# Patient Record
Sex: Male | Born: 1950 | Race: White | Hispanic: No | Smoking: Former smoker
Health system: Southern US, Community
[De-identification: ages and names within clinical notes are randomized; demographics above are authoritative.]

## PROBLEM LIST (undated history)

## (undated) DIAGNOSIS — H9319 Tinnitus, unspecified ear: Secondary | ICD-10-CM

## (undated) DIAGNOSIS — E7800 Pure hypercholesterolemia, unspecified: Secondary | ICD-10-CM

## (undated) DIAGNOSIS — F319 Bipolar disorder, unspecified: Secondary | ICD-10-CM

## (undated) DIAGNOSIS — F32A Depression, unspecified: Secondary | ICD-10-CM

## (undated) DIAGNOSIS — I219 Acute myocardial infarction, unspecified: Secondary | ICD-10-CM

## (undated) DIAGNOSIS — I2510 Atherosclerotic heart disease of native coronary artery without angina pectoris: Secondary | ICD-10-CM

## (undated) DIAGNOSIS — F329 Major depressive disorder, single episode, unspecified: Secondary | ICD-10-CM

## (undated) DIAGNOSIS — K219 Gastro-esophageal reflux disease without esophagitis: Secondary | ICD-10-CM

## (undated) DIAGNOSIS — F419 Anxiety disorder, unspecified: Secondary | ICD-10-CM

## (undated) DIAGNOSIS — G43909 Migraine, unspecified, not intractable, without status migrainosus: Secondary | ICD-10-CM

## (undated) DIAGNOSIS — M1990 Unspecified osteoarthritis, unspecified site: Secondary | ICD-10-CM

## (undated) DIAGNOSIS — D509 Iron deficiency anemia, unspecified: Secondary | ICD-10-CM

## (undated) HISTORY — PX: HAND SURGERY: SHX662

## (undated) HISTORY — PX: ROTATOR CUFF REPAIR: SHX139

## (undated) HISTORY — PX: HERNIA REPAIR: SHX51

## (undated) HISTORY — PX: BACK SURGERY: SHX140

## (undated) HISTORY — DX: Pure hypercholesterolemia, unspecified: E78.00

## (undated) HISTORY — DX: Iron deficiency anemia, unspecified: D50.9

---

## 2001-02-23 ENCOUNTER — Encounter: Admission: RE | Admit: 2001-02-23 | Discharge: 2001-02-23 | Payer: Self-pay | Admitting: Otolaryngology

## 2001-02-23 ENCOUNTER — Encounter: Payer: Self-pay | Admitting: Otolaryngology

## 2001-02-23 ENCOUNTER — Ambulatory Visit (HOSPITAL_COMMUNITY): Admission: RE | Admit: 2001-02-23 | Discharge: 2001-02-23 | Payer: Self-pay | Admitting: Otolaryngology

## 2002-07-20 ENCOUNTER — Encounter: Payer: Self-pay | Admitting: Family Medicine

## 2002-07-20 ENCOUNTER — Ambulatory Visit (HOSPITAL_COMMUNITY): Admission: RE | Admit: 2002-07-20 | Discharge: 2002-07-20 | Payer: Self-pay | Admitting: Family Medicine

## 2002-07-23 ENCOUNTER — Ambulatory Visit (HOSPITAL_COMMUNITY): Admission: RE | Admit: 2002-07-23 | Discharge: 2002-07-23 | Payer: Self-pay | Admitting: Family Medicine

## 2002-07-23 ENCOUNTER — Encounter: Payer: Self-pay | Admitting: Family Medicine

## 2002-08-29 ENCOUNTER — Ambulatory Visit (HOSPITAL_BASED_OUTPATIENT_CLINIC_OR_DEPARTMENT_OTHER): Admission: RE | Admit: 2002-08-29 | Discharge: 2002-08-30 | Payer: Self-pay | Admitting: Orthopedic Surgery

## 2004-02-03 ENCOUNTER — Ambulatory Visit (HOSPITAL_COMMUNITY): Admission: RE | Admit: 2004-02-03 | Discharge: 2004-02-03 | Payer: Self-pay | Admitting: Family Medicine

## 2004-05-28 ENCOUNTER — Emergency Department (HOSPITAL_COMMUNITY): Admission: EM | Admit: 2004-05-28 | Discharge: 2004-05-28 | Payer: Self-pay | Admitting: *Deleted

## 2004-08-17 ENCOUNTER — Emergency Department (HOSPITAL_COMMUNITY): Admission: EM | Admit: 2004-08-17 | Discharge: 2004-08-17 | Payer: Self-pay | Admitting: Emergency Medicine

## 2004-08-17 ENCOUNTER — Ambulatory Visit: Payer: Self-pay | Admitting: Psychiatry

## 2004-08-17 ENCOUNTER — Inpatient Hospital Stay (HOSPITAL_COMMUNITY): Admission: RE | Admit: 2004-08-17 | Discharge: 2004-08-21 | Payer: Self-pay | Admitting: Psychiatry

## 2006-06-29 ENCOUNTER — Ambulatory Visit (HOSPITAL_COMMUNITY): Admission: RE | Admit: 2006-06-29 | Discharge: 2006-06-29 | Payer: Self-pay | Admitting: General Surgery

## 2006-06-29 ENCOUNTER — Encounter (INDEPENDENT_AMBULATORY_CARE_PROVIDER_SITE_OTHER): Payer: Self-pay | Admitting: General Surgery

## 2007-09-28 ENCOUNTER — Ambulatory Visit (HOSPITAL_BASED_OUTPATIENT_CLINIC_OR_DEPARTMENT_OTHER): Admission: RE | Admit: 2007-09-28 | Discharge: 2007-09-28 | Payer: Self-pay | Admitting: Orthopedic Surgery

## 2008-12-13 ENCOUNTER — Ambulatory Visit (HOSPITAL_COMMUNITY): Admission: RE | Admit: 2008-12-13 | Discharge: 2008-12-13 | Payer: Self-pay | Admitting: Family Medicine

## 2009-12-25 ENCOUNTER — Ambulatory Visit (HOSPITAL_COMMUNITY)
Admission: RE | Admit: 2009-12-25 | Discharge: 2009-12-25 | Payer: Self-pay | Source: Home / Self Care | Admitting: Internal Medicine

## 2009-12-25 ENCOUNTER — Ambulatory Visit: Payer: Self-pay | Admitting: Internal Medicine

## 2010-06-09 NOTE — Op Note (Signed)
Corey [name], Corey [name]               ACCOUNT NO.:  [id number]   MEDICAL RECORD NO.:  [id number]          PATIENT TYPE:  AMB   LOCATION:  DAY                           FACILITY:  [facility]   PHYSICIAN:  Dalia Heading, M.D.  DATE OF BIRTH:  October 02, 1950   DATE OF PROCEDURE:  06/29/2006  DATE OF DISCHARGE:                               OPERATIVE REPORT   PREOPERATIVE DIAGNOSIS:  Neoplasm, left thigh.   POSTOPERATIVE DIAGNOSIS:  Neoplasm, left thigh, probable lipoma.   PROCEDURE:  Excision of neoplasm, left thigh.   SURGEON:  Dalia Heading, M.D.   ANESTHESIA:  General.   INDICATIONS:  The patient is a 60 year old white male who presents with  an enlarging subcutaneous mass below the left gluteal fold posteriorly.  The risks and benefits of the procedure including bleeding, infection,  and recurrence of the mass were fully explained to the patient who gave  informed consent.   PROCEDURE NOTE:  The patient was placed in the right lateral decubitus  position after general anesthesia was administered.  The left posterior  upper thigh was prepped and draped in the usual sterile technique with  Betadine.  Surgical site confirmation was performed.   A transverse incision was made over the mass which ultimately measured  approximately 6 cm in its greatest diameter.  It appeared to be a  lipoma.  It was fully excised down to the muscle layer without  difficulty.  It was sent to pathology for further examination.  Any  bleeding was controlled using Bovie electrocautery.  The subcutaneous  layer was reapproximated using a 3-0 Vicryl interrupted suture.  The  skin was closed using a 4-0 Vicryl subcuticular suture.  0.5%  Sensorcaine was instilled in the surrounding wound.  Dermabond was then  applied.   All tape and needle counts were correct at the end of the procedure.  The patient was awakened and transferred to the PACU in stable  condition.   COMPLICATIONS:  None.   SPECIMEN:  Soft  tissue neoplasm, left thigh.   ESTIMATED BLOOD LOSS:  Minimal.      Dalia Heading, M.D.  Electronically Signed     MAJ/MEDQ  D:  06/29/2006  T:  06/29/2006  Job:  [id number]   cc:   Patrica Duel, M.D.  Fax: [phone]

## 2010-06-09 NOTE — Op Note (Signed)
NAMEKEVANTE, LUNT               ACCOUNT NO.:  [id number]   MEDICAL RECORD NO.:  [id number]          PATIENT TYPE:  AMB   LOCATION:  DSC                          FACILITY:  [facility]   PHYSICIAN:  Loreta Ave, M.D. DATE OF BIRTH:  1950/06/10   DATE OF PROCEDURE:  09/28/2007  DATE OF DISCHARGE:                               OPERATIVE REPORT   PREOPERATIVE DIAGNOSIS:  Right shoulder massive rotator cuff avulsion,  previous repair and decompression more than 10 years ago.   POSTOPERATIVE DIAGNOSES:  1. Right shoulder massive rotator cuff avulsion, previous repair and      decompression more than 10 years ago.  2. Marked intertendinous tearing, retraction, but still able to be      repaired.  3. Previous rupture of long head biceps tendon.  4. Previous bony decompression.  5. No evidence of recurrent bony impingement.  6. Complex tearing, superior and posterior labrum.   PROCEDURE:  1. Right shoulder exam under anesthesia.  2. Arthroscopy.  3. Debridement of labrum.  4. Extensive cuff debridement with lysis debridement of adhesions to      free up and advance rotator cuff to allow for repair.  5. Arthroscopic-assisted repair with fiber weave suture x3 and Swivel-      Lock anchors x3.   SURGEON:  Loreta Ave, MD   ASSISTANT:  Genene Churn. Barry Dienes, [place], present at the entire case, necessary  for timely completion of procedure.   ANESTHESIA:  General.   BLOOD LOSS:  Minimal.   SPECIMENS:  None.   CULTURES:  None.   COMPLICATIONS:  None.   DRESSING:  Sterile compressive with shoulder immobilizer and abduction  pillow.   PROCEDURE:  The patient was brought to the operating room and after  adequate anesthesia had been obtained, placed in beach-chair position on  a shoulder positioner, and prepped and draped in the usual sterile  fashion.  Shoulder examined.  Full motion, good stability.  Three  portals created; anterior, posterior, and lateral.  Shoulder entered  with  blunt obturator.  Arthroscope was introduced and inspected.  Extensive intratendinous tearing, degeneration of the cuff.  Completely  avulsed off the humeral attachment.  All the abnormal degenerative  tissue debrided.  This left the cuff about half way or more to the  glenoid.  After thoroughly assessing the tissue despite degree of  retraction, repair was a feasible option.  I freed up the cuff above and  below going extremely medial in order to immobilize this to allow for  repair.  The superior labrum, which was torn was debrided as was the  posterosuperior labrum.  Biceps tendon was absent from previous  intervention.  Looked above and no recurrent bony impingement at the  acromion or distal clavicle.  When that was all complete and the cuff  immobilized, it was captured with 3 horizontal fiber weave mattress  sutures.  These were all pulled out laterally to advance the cuff to be  sure it got in good position without too much tension.  Using Swivel-  Lock device, all 3 sutures were then firmly anchored  in the tuberosity.  At completion, I had a nice firm watertight closure of the cuff,  advanced over to the tuberosity, and I could bring through full passive  motion without too much tension on the repair.  This was inspected on  full angles and again I confirmed no further bony decompression was  necessary.  The lateral portal, which had been opened for a large  cannula then had the cannula removed.  Wounds were all irrigated and  closed with nylon.  Sterile compressive dressing was applied.  Shoulder  immobilizer with abduction pillow applied.  Anesthesia reversed.  Brought to the recovery room.  Tolerated the surgery well.  No  complications.      Loreta Ave, M.D.  Electronically Signed     DFM/MEDQ  D:  09/28/2007  T:  09/29/2007  Job:  [id number]

## 2010-06-09 NOTE — H&P (Signed)
NAMESAILOR, HAUGHN               ACCOUNT NO.:  [id number]   MEDICAL RECORD NO.:  [id number]          PATIENT TYPE:  AMB   LOCATION:  DAY                           FACILITY:  [facility]   PHYSICIAN:  Dalia Heading, M.D.  DATE OF BIRTH:  08-09-1950   DATE OF ADMISSION:  06/29/2006  DATE OF DISCHARGE:  LH                              HISTORY & PHYSICAL   CHIEF COMPLAINT:  Neoplasm, left thigh.   HISTORY OF PRESENT ILLNESS:  The patient is a 60 year old white male who  was referred for evaluation and treatment of a mass on his left thigh.  It is posterior and inferior to the gluteal fold.  It is tender to  touch.  It is causing him discomfort when sitting.  It has been  worsening over the past month.   PAST MEDICAL HISTORY:  Bipolar disorder.   PAST SURGICAL HISTORY:  1. Lower back surgery.  2. Rotator cuff repair.  3. Herniorrhaphy.   CURRENT MEDICATIONS:  1. Depakote.  2. Lamictal.  3. Xanax.   ALLERGIES:  No known drug allergies.   SOCIAL HISTORY:  The patient denies drinking or smoking.   REVIEW OF SYSTEMS:  Denies any cardiopulmonary difficulties or bleeding  disorders.   PHYSICAL EXAMINATION:  GENERAL:  The patient is a well-developed and  well-nourished white male, in no acute distress.  LUNGS:  Clear to auscultation with equal breath sounds bilaterally.  HEART:  Exam reveals an regular rate and rhythm without S3, S4 or  murmurs.  EXTREMITIES:  Reveals a 5 cm soft ovoid subcutaneous mass below the left  gluteal fold posteriorly on the left thigh.   IMPRESSION:  Neoplasm, left thigh, subcutaneous.   PLAN:  The patient is scheduled for an excision of the mass, left thigh  on June 29, 2006.  The risks and benefits of the procedure, including  bleeding, infection and pain were fully explained to the patient, who  gave an informed consent.      Dalia Heading, M.D.  Electronically Signed     MAJ/MEDQ  D:  06/28/2006  T:  06/28/2006  Job:  [id number]   cc:   Patrica Duel, M.D.  Fax: [phone]

## 2010-06-12 NOTE — Discharge Summary (Signed)
Corey [name], Corey [name]               ACCOUNT NO.:  [id number]   MEDICAL RECORD NO.:  [id number]          PATIENT TYPE:  IPS   LOCATION:  0303                          FACILITY:  [facility]   PHYSICIAN:  Anselm Jungling, MD  DATE OF BIRTH:  21-Dec-1950   DATE OF ADMISSION:  08/17/2004  DATE OF DISCHARGE:  08/21/2004                                 DISCHARGE SUMMARY   IDENTIFYING DATA AND REASON FOR ADMISSION:  This is the second [facility] admission  for Corey [name], a 60 year old married white male with a history of bipolar  affective disorder.  He was admitted due to increasing depression and  irritability, and mood lability.  He is a patient of Dr. Tomasa Rand.  In  addition to the above, he presented with anhedonia and decreased  concentration for 3 weeks, which made it impossible for him to attend work  as usual.  He had suicidal ideation in the emergency department prior to  admission, but once he was admitted to the inpatient service.   The patient had been on a regimen of Depakote, Xanax, and Lamictal.   INITIAL DIAGNOSTIC IMPRESSION:  AXIS I:  Bipolar affective disorder,  depressed versus mixed.  AXIS II:  Deferred.  AXIS III:  No acute or chronic illnesses.  AXIS IV:  Stressors, severe.  AXIS V:  Global assessment of function on admission 40.   MEDICAL AND LABORATORY:  Admission laboratory included a CBC and chemistry  panel which were within normal limits.  A hemoglobin A1c was normal at 5.8.  A urinalysis was within normal limits.   HOSPITAL COURSE:  The patient was admitted to the adult inpatient service  where he participated in various therapeutic groups, activities and classes  designed to help him acquire better coping skills, a better understanding of  his underlying disorders and dynamics, and the development of an aftercare  plan.  He processed issues around an insensitive boss at work, that was  contributing to his depression and mood lability.   He slept poorly throughout his  inpatient stay.  Initially, trazodone 50 mg  at h.s. was tried, and later increased to 100 mg.  Finally, with a  combination of trazodone 100 mg and Ambien 10 mg he slept reasonably well.   He was continued on Depakote ER at a dose of 2000 mg q.h.s.  He was also  continued on Lamictal 100 mg q.h.s., Xanax 1 mg b.i.d., Effexor XR 150 mg  p.o. daily, and Wellbutrin XL 150 mg p.o. daily.   Over the remainder of his the patient stay, he became gradually less  depressed, with brighter affect, greater hopefulness and future orientation.   AFTERCARE PLANS:  The patient was discharged with a plan for him to follow  up with Dr. Tomasa Rand on July 31 at 9:45 a.m.  Also, he was to see his  therapist, Mr. Letta Kocher, at [facility], on September 01, 2004 at 3:15  p.m.   DISCHARGE MEDICATIONS:  Depakote ER 2000 mg at bedtime, Xanax 1 mg b.i.d.,  Lamictal 100 mg daily, Effexor XR 150 mg daily, and Wellbutrin XL 150 mg  p.o. daily.   DISCHARGE DIAGNOSES:  AXIS I:  Bipolar affective disorder, most recently  depressed, without psychotic features.  AXIS II:  Deferred.  AXIS III:  No acute or chronic illnesses.  AXIS IV:  Stressors, severe.  AXIS V:  Global assessment of function on discharge 75.           ______________________________  Anselm Jungling, MD  Electronically Signed     SPB/MEDQ  D:  09/09/2004  T:  09/09/2004  Job:  [id number]

## 2010-06-12 NOTE — Op Note (Signed)
NAME:  Corey [name], Corey [name]                         ACCOUNT NO.:  [id number]   MEDICAL RECORD NO.:  [id number]                   PATIENT TYPE:  AMB   LOCATION:  DSC                                  FACILITY:  [facility]   PHYSICIAN:  Loreta Ave, M.D.              DATE OF BIRTH:  [birth year]   DATE OF PROCEDURE:  08/29/2002  DATE OF DISCHARGE:                                 OPERATIVE REPORT   PREOPERATIVE DIAGNOSIS:  Traumatic rotator cuff tear, left shoulder, with  subacromial impingement and distal clavicle osteolysis.   POSTOPERATIVE DIAGNOSIS:  Traumatic rotator cuff tear, left shoulder, with  subacromial impingement and distal clavicle osteolysis, with partial  tearing, long head biceps tendon, as well as antrum labrum tear.   PROCEDURES:  1. Left shoulder examination under anesthesia, arthroscopy, debridement of     biceps tendon and labral partial tears.  2. Debridement of rotator cuff.  3. Acromioplasty.  4. Release, coracoacromial ligament.  5. Excision, distal clavicle.  6. Open repair, rotator cuff tear, with Fibrewire suture and Concept repair     system.   SURGEON:  Loreta Ave, M.D.   ASSISTANT:  Arlys John D. Petrarca, P.A.-C.   ANESTHESIA:  General.   ESTIMATED BLOOD LOSS:  Minimal.   SPECIMENS:  None.   CULTURES:  None.   COMPLICATIONS:  None.   DRESSING:  Soft compressive with shoulder immobilizer.   DESCRIPTION OF PROCEDURE:  Patient brought to the operating room and placed  on the operating table in supine position.  After adequate anesthesia had  been obtained, the left shoulder examined.  Full motion and good stability.  Placed in a beach chair position on the shoulder positioner and prepped and  draped in the usual sterile fashion.  Three standard arthroscopic portals,  anterior, posterior, lateral.  Shoulder entered with a blunt obturator,  distended, and inspected.  Full-thickness tearing, supraspinatus tendon,  almost entire crescent.   Tearing, long head biceps tendon.  Both of these  were debrided.  Although question of subluxation, long head biceps tendon by  MRI, this was anteriorly placed but I will felt it was within the bicipital  groove.  The biceps anchor intact.  Anterior labral tear in the midportion  debrided.  No instability.  Capsular and ligamentous structures intact.  After debridement of the inside of the shoulder, cannula redirected  subacromially.  Typical impingement.  Confirmation of full-thickness tear of  cuff.  Acromioplasty from a type 3 to a type 1 acromion with shaver and high-  speed bur.  CA ligament released with cautery.  Distal clavicle, grade 3 and  4 changes.  Lateral centimeter resected.  Adequacy of decompression and  clavicle excision confirmed viewing from all portals.  Instruments and fluid  removed.  A deltoid-splitting incision through the lateral portal.  Subacromial space accessed.  The cuff was mobilized, debrided back to  healthy tissue.  Adequacy of decompression  confirmed visually.  The cuff was  well-captured with Fibrewire suture and a bony trough created in the humerus  at the site of attachment.  I still felt the biceps tendon was anchored  within the bicipital groove, and it was left in place.  The biceps did not  need further tenodesis.  The rotator cuff was then repaired at the humerus  utilizing Fibrewire suture and the Concept repair system, passing the  sutures through drill holes.  Firmly tied over a bony bridge.  A nice, firm  watertight closure of the cuff.  Adequacy of repair confirmed.  I could go  through full passive motion without undue tension after repair.  Wound  copiously irrigated.  Deltoid closed with Vicryl.  Skin and subcutaneous  tissue with Vicryl.  The portals closed with nylon.  Margins of the wound  injected with Marcaine.  Sterile compressive dressing applied.  Sling  applied.  Anesthesia reversed.  Brought to the recovery room.  Tolerated the   surgery well with no complications.                                                Loreta Ave, M.D.    DFM/MEDQ  D:  08/29/2002  T:  08/30/2002  Job:  [id number]

## 2010-10-28 LAB — POCT HEMOGLOBIN-HEMACUE: Hemoglobin: 15.1

## 2010-11-12 LAB — BASIC METABOLIC PANEL
BUN: 12
Calcium: 9
Creatinine, Ser: 1.1
GFR calc non Af Amer: >60
Potassium: 4

## 2010-11-12 LAB — CBC
Platelets: 215
WBC: 7.8

## 2011-02-01 DIAGNOSIS — G43719 Chronic migraine without aura, intractable, without status migrainosus: Secondary | ICD-10-CM | POA: Diagnosis not present

## 2011-02-01 DIAGNOSIS — G4700 Insomnia, unspecified: Secondary | ICD-10-CM | POA: Diagnosis not present

## 2011-04-01 DIAGNOSIS — G4700 Insomnia, unspecified: Secondary | ICD-10-CM | POA: Diagnosis not present

## 2011-04-01 DIAGNOSIS — G43719 Chronic migraine without aura, intractable, without status migrainosus: Secondary | ICD-10-CM | POA: Diagnosis not present

## 2011-04-02 DIAGNOSIS — R51 Headache: Secondary | ICD-10-CM | POA: Diagnosis not present

## 2011-04-12 DIAGNOSIS — F331 Major depressive disorder, recurrent, moderate: Secondary | ICD-10-CM | POA: Diagnosis not present

## 2011-04-30 DIAGNOSIS — G43009 Migraine without aura, not intractable, without status migrainosus: Secondary | ICD-10-CM | POA: Diagnosis not present

## 2011-04-30 DIAGNOSIS — M47812 Spondylosis without myelopathy or radiculopathy, cervical region: Secondary | ICD-10-CM | POA: Diagnosis not present

## 2011-08-16 DIAGNOSIS — F331 Major depressive disorder, recurrent, moderate: Secondary | ICD-10-CM | POA: Diagnosis not present

## 2011-09-07 DIAGNOSIS — G43009 Migraine without aura, not intractable, without status migrainosus: Secondary | ICD-10-CM | POA: Diagnosis not present

## 2011-10-04 ENCOUNTER — Emergency Department (HOSPITAL_COMMUNITY)
Admission: EM | Admit: 2011-10-04 | Discharge: 2011-10-04 | Disposition: A | Discharge status: Home / Self Care | Payer: No Typology Code available for payment source | Attending: Emergency Medicine | Admitting: Emergency Medicine

## 2011-10-04 ENCOUNTER — Emergency Department (HOSPITAL_COMMUNITY): Payer: No Typology Code available for payment source

## 2011-10-04 ENCOUNTER — Encounter (HOSPITAL_COMMUNITY): Payer: Self-pay | Admitting: *Deleted

## 2011-10-04 DIAGNOSIS — S39012A Strain of muscle, fascia and tendon of lower back, initial encounter: Secondary | ICD-10-CM

## 2011-10-04 DIAGNOSIS — Y93I9 Activity, other involving external motion: Secondary | ICD-10-CM | POA: Insufficient documentation

## 2011-10-04 DIAGNOSIS — Y998 Other external cause status: Secondary | ICD-10-CM | POA: Insufficient documentation

## 2011-10-04 DIAGNOSIS — S335XXA Sprain of ligaments of lumbar spine, initial encounter: Secondary | ICD-10-CM | POA: Insufficient documentation

## 2011-10-04 DIAGNOSIS — S161XXA Strain of muscle, fascia and tendon at neck level, initial encounter: Secondary | ICD-10-CM

## 2011-10-04 DIAGNOSIS — S139XXA Sprain of joints and ligaments of unspecified parts of neck, initial encounter: Secondary | ICD-10-CM | POA: Insufficient documentation

## 2011-10-04 MED ORDER — HYDROCODONE-ACETAMINOPHEN 5-325 MG PO TABS
2.0000 | ORAL_TABLET | Freq: Once | ORAL | Status: DC
  Filled 2011-10-04: qty 2

## 2011-10-04 MED ORDER — ONDANSETRON HCL 4 MG PO TABS
4.0000 mg | ORAL_TABLET | Freq: Once | ORAL | Status: DC
  Filled 2011-10-04: qty 1

## 2011-10-04 MED ORDER — HYDROCODONE-ACETAMINOPHEN 5-325 MG PO TABS: ORAL_TABLET | ORAL | Status: DC

## 2011-10-04 MED ORDER — METHOCARBAMOL 500 MG PO TABS
500.0000 mg | ORAL_TABLET | Freq: Once | ORAL | Status: DC
  Filled 2011-10-04: qty 1

## 2011-10-04 MED ORDER — CYCLOBENZAPRINE HCL 10 MG PO TABS: 10.0000 mg | ORAL_TABLET | Freq: Two times a day (BID) | ORAL | Status: AC | PRN
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
## 2011-10-04 NOTE — ED Notes (Signed)
c-collar placed in triage

## 2011-10-04 NOTE — ED Notes (Signed)
No answer when called 

## 2011-10-04 NOTE — ED Notes (Signed)
Hobson PA in room with pt and family at present time,

## 2011-10-05 NOTE — ED Provider Notes (Signed)
Medical screening examination/treatment/procedure(s) were performed by non-physician practitioner and as supervising physician I was immediately available for consultation/collaboration.   Zygmund Passero W Mats Jeanlouis, MD 10/05/11 1703 

## 2011-10-06 ENCOUNTER — Other Ambulatory Visit (HOSPITAL_COMMUNITY): Payer: Self-pay | Admitting: Nephrology

## 2011-10-06 DIAGNOSIS — N289 Disorder of kidney and ureter, unspecified: Secondary | ICD-10-CM

## 2011-10-06 DIAGNOSIS — I10 Essential (primary) hypertension: Secondary | ICD-10-CM | POA: Diagnosis not present

## 2011-10-06 DIAGNOSIS — D649 Anemia, unspecified: Secondary | ICD-10-CM | POA: Diagnosis not present

## 2011-10-06 DIAGNOSIS — R809 Proteinuria, unspecified: Secondary | ICD-10-CM | POA: Diagnosis not present

## 2011-10-07 DIAGNOSIS — F331 Major depressive disorder, recurrent, moderate: Secondary | ICD-10-CM | POA: Diagnosis not present

## 2011-10-21 ENCOUNTER — Ambulatory Visit (HOSPITAL_COMMUNITY)
Admission: RE | Admit: 2011-10-21 | Discharge: 2011-10-21 | Disposition: A | Discharge status: Home / Self Care | Payer: BC Managed Care – PPO | Source: Ambulatory Visit | Attending: Nephrology | Admitting: Nephrology

## 2011-10-21 DIAGNOSIS — I10 Essential (primary) hypertension: Secondary | ICD-10-CM | POA: Diagnosis not present

## 2011-10-21 DIAGNOSIS — Z79899 Other long term (current) drug therapy: Secondary | ICD-10-CM | POA: Diagnosis not present

## 2011-10-21 DIAGNOSIS — Z1159 Encounter for screening for other viral diseases: Secondary | ICD-10-CM | POA: Diagnosis not present

## 2011-10-21 DIAGNOSIS — D649 Anemia, unspecified: Secondary | ICD-10-CM | POA: Diagnosis not present

## 2011-10-21 DIAGNOSIS — N289 Disorder of kidney and ureter, unspecified: Secondary | ICD-10-CM

## 2011-10-21 DIAGNOSIS — R809 Proteinuria, unspecified: Secondary | ICD-10-CM | POA: Diagnosis not present

## 2011-10-21 DIAGNOSIS — N182 Chronic kidney disease, stage 2 (mild): Secondary | ICD-10-CM | POA: Diagnosis not present

## 2011-10-25 DIAGNOSIS — G43919 Migraine, unspecified, intractable, without status migrainosus: Secondary | ICD-10-CM | POA: Diagnosis not present

## 2011-10-25 DIAGNOSIS — Z79899 Other long term (current) drug therapy: Secondary | ICD-10-CM | POA: Diagnosis not present

## 2011-10-25 DIAGNOSIS — G479 Sleep disorder, unspecified: Secondary | ICD-10-CM | POA: Diagnosis not present

## 2011-11-10 DIAGNOSIS — Z23 Encounter for immunization: Secondary | ICD-10-CM | POA: Diagnosis not present

## 2011-12-08 ENCOUNTER — Encounter (INDEPENDENT_AMBULATORY_CARE_PROVIDER_SITE_OTHER): Payer: Self-pay | Admitting: *Deleted

## 2011-12-09 ENCOUNTER — Encounter (INDEPENDENT_AMBULATORY_CARE_PROVIDER_SITE_OTHER): Payer: Self-pay | Admitting: Internal Medicine

## 2011-12-09 ENCOUNTER — Ambulatory Visit (INDEPENDENT_AMBULATORY_CARE_PROVIDER_SITE_OTHER): Payer: Medicare Other | Admitting: Internal Medicine

## 2011-12-09 VITALS — BP 120/80 | HR 60 | Temp 97.7°F | Ht 71.0 in | Wt 209.8 lb

## 2011-12-09 DIAGNOSIS — E7800 Pure hypercholesterolemia, unspecified: Secondary | ICD-10-CM | POA: Insufficient documentation

## 2011-12-09 DIAGNOSIS — D509 Iron deficiency anemia, unspecified: Secondary | ICD-10-CM | POA: Diagnosis not present

## 2011-12-09 HISTORY — DX: Iron deficiency anemia, unspecified: D50.9

## 2011-12-09 LAB — CBC WITH DIFFERENTIAL/PLATELET
Basophils Absolute: 0.1 10*3/uL (ref 0.0–0.1)
HCT: 38.8 % — ABNORMAL LOW (ref 39.0–52.0)
Hemoglobin: 12.6 g/dL — ABNORMAL LOW (ref 13.0–17.0)
Lymphocytes Relative: 39 % (ref 12–46)
Monocytes Absolute: 0.9 10*3/uL (ref 0.1–1.0)
Monocytes Relative: 9 % (ref 3–12)
Neutro Abs: 4.9 10*3/uL (ref 1.7–7.7)
RBC: 5.41 MIL/uL (ref 4.22–5.81)
WBC: 9.7 10*3/uL (ref 4.0–10.5)

## 2011-12-09 NOTE — Progress Notes (Addendum)
Subjective:     Patient ID: Corey [name], male   DOB: [birth year], 61 y.o.   MRN: [id number]  HPI Referred to our office by Dr. Sherwood Gambler for iron deficiency anemia.  He tells me he gave blood 3 months ago without any problems. Appetite is good. No weight loss.  No abdominal pain. BMs were brown before taking the iron. No melena or bright red rectal bleeding. He tells me he feels tired.. Really no GI symptoms. Saw Dr.  Kristian Covey for elevated creatinine of 1.37 (minimal), and protein in urine.  10/21/2011 H and H 12.3 and 40.2, MCV 78 Iron 34, TIBC 443, UIBC 409, Iron 34, ferritin 9, HBsAg Negative, ANA negative, HCV antibody negative, HIV negative 1211/2011 Colonoscopy: Average risk screening. Dr. Karilyn Cota: Normal colonoscopy 07/20/11 H and H 13.8 and 42.0, MCV 76.2 12/6/012 H and H 13.5 and 40.6, MCV 88.1  Review of Systems see hpio Current Outpatient Prescriptions  Medication Sig Dispense Refill  . ALPRAZolam (XANAX) 1 MG tablet Take 0.5-1 mg by mouth 2 (two) times daily as needed. For anxiety      . aspirin EC 81 MG tablet Take 81 mg by mouth daily.      Marland Kitchen buPROPion (WELLBUTRIN SR) 150 MG 12 hr tablet Take 150 mg by mouth daily.      . divalproex (DEPAKOTE) 500 MG DR tablet Take 500 mg by mouth 2 (two) times daily. For headaches      . fish oil-omega-3 fatty acids 1000 MG capsule Take 1 g by mouth 2 (two) times daily.      . Iron-Vit C-Vit B12-Folic Acid (IRON 100 PLUS PO) Take 150 mg by mouth.      . Multiple Vitamins-Minerals (CENTRUM SILVER PO) Take 1 tablet by mouth daily.      . sertraline (ZOLOFT) 100 MG tablet Take 100 mg by mouth daily.      . simvastatin (ZOCOR) 40 MG tablet Take 40 mg by mouth every evening.      . topiramate (TOPAMAX) 100 MG tablet Take 100 mg by mouth at bedtime.       Past Medical History  Diagnosis Date  . High cholesterol   . Iron deficiency anemia 12/09/2011   No Known Allergies      Objective:   Physical Exam Filed Vitals:   12/09/11 1046  BP:  120/80  Pulse: 60  Temp: 97.7 F (36.5 C)  Height: 5\' 11"  (1.803 m)  Weight: 209 lb 12.8 oz (95.165 kg)   Alert and oriented. Skin warm and dry. Oral mucosa is moist.   . Sclera anicteric, conjunctivae is pink. Thyroid not enlarged. No cervical lymphadenopathy. Lungs clear. Heart regular rate and rhythm.  Abdomen is soft. Bowel sounds are positive. No hepatomegaly. No abdominal masses felt. No tenderness.  No edema to lower extremities.  No stool in rectum and guaiac negative.     Assessment:    Iron deficiency anemia. Stool today was negative. I discussed this case with Dr. Karilyn Cota.     Plan:    Stool cards x 3 . CBC today. May need Given's capsule. Will talk with Dr Karilyn Cota once stool cards are back.

## 2011-12-09 NOTE — Patient Instructions (Addendum)
Hemocult cards. CBC today

## 2011-12-15 ENCOUNTER — Other Ambulatory Visit (INDEPENDENT_AMBULATORY_CARE_PROVIDER_SITE_OTHER): Payer: Self-pay | Admitting: *Deleted

## 2011-12-15 DIAGNOSIS — D509 Iron deficiency anemia, unspecified: Secondary | ICD-10-CM

## 2011-12-29 ENCOUNTER — Encounter (INDEPENDENT_AMBULATORY_CARE_PROVIDER_SITE_OTHER): Payer: Self-pay

## 2012-01-04 ENCOUNTER — Encounter (HOSPITAL_COMMUNITY): Payer: Self-pay | Admitting: Pharmacy Technician

## 2012-01-24 ENCOUNTER — Encounter (INDEPENDENT_AMBULATORY_CARE_PROVIDER_SITE_OTHER): Payer: Self-pay | Admitting: *Deleted

## 2012-01-27 ENCOUNTER — Encounter (HOSPITAL_COMMUNITY)
Admission: RE | Disposition: A | Discharge status: Home / Self Care | Payer: PRIVATE HEALTH INSURANCE | Source: Ambulatory Visit | Attending: Internal Medicine

## 2012-01-27 ENCOUNTER — Encounter (HOSPITAL_COMMUNITY): Payer: Self-pay | Admitting: *Deleted

## 2012-01-27 ENCOUNTER — Ambulatory Visit (HOSPITAL_COMMUNITY)
Admission: RE | Admit: 2012-01-27 | Discharge: 2012-01-27 | Disposition: A | Discharge status: Home / Self Care | Payer: PRIVATE HEALTH INSURANCE | Source: Ambulatory Visit | Attending: Internal Medicine | Admitting: Internal Medicine

## 2012-01-27 DIAGNOSIS — K2270 Barrett's esophagus without dysplasia: Secondary | ICD-10-CM | POA: Insufficient documentation

## 2012-01-27 DIAGNOSIS — K449 Diaphragmatic hernia without obstruction or gangrene: Secondary | ICD-10-CM | POA: Insufficient documentation

## 2012-01-27 DIAGNOSIS — Z7982 Long term (current) use of aspirin: Secondary | ICD-10-CM | POA: Diagnosis not present

## 2012-01-27 DIAGNOSIS — D509 Iron deficiency anemia, unspecified: Secondary | ICD-10-CM | POA: Insufficient documentation

## 2012-01-27 DIAGNOSIS — D131 Benign neoplasm of stomach: Secondary | ICD-10-CM

## 2012-01-27 HISTORY — DX: Depression, unspecified: F32.A

## 2012-01-27 HISTORY — DX: Major depressive disorder, single episode, unspecified: F32.9

## 2012-01-27 HISTORY — PX: ESOPHAGOGASTRODUODENOSCOPY: SHX5428

## 2012-01-27 SURGERY — EGD (ESOPHAGOGASTRODUODENOSCOPY)
Anesthesia: Moderate Sedation

## 2012-01-27 MED ORDER — STERILE WATER FOR IRRIGATION IR SOLN
Status: DC | PRN
  Administered 2012-01-27: 10:00:00

## 2012-01-27 MED ORDER — SODIUM CHLORIDE 0.45 % IV SOLN
INTRAVENOUS | Status: DC
  Administered 2012-01-27: 09:00:00 via INTRAVENOUS

## 2012-01-27 MED ORDER — MIDAZOLAM HCL 5 MG/5ML IJ SOLN
INTRAMUSCULAR | Status: DC | PRN
  Administered 2012-01-27: 2 mg via INTRAVENOUS
  Administered 2012-01-27: 1 mg via INTRAVENOUS
  Administered 2012-01-27 (×2): 2 mg via INTRAVENOUS
  Administered 2012-01-27: 1 mg via INTRAVENOUS
  Administered 2012-01-27: 2 mg via INTRAVENOUS

## 2012-01-27 MED ORDER — MEPERIDINE HCL 50 MG/ML IJ SOLN
INTRAMUSCULAR | Status: AC
  Filled 2012-01-27: qty 1

## 2012-01-27 MED ORDER — PANTOPRAZOLE SODIUM 40 MG PO TBEC: 40.0000 mg | DELAYED_RELEASE_TABLET | Freq: Every day | ORAL | Status: DC

## 2012-01-27 MED ORDER — MIDAZOLAM HCL 5 MG/5ML IJ SOLN
INTRAMUSCULAR | Status: AC
  Filled 2012-01-27: qty 10

## 2012-01-27 MED ORDER — MEPERIDINE HCL 25 MG/ML IJ SOLN
INTRAMUSCULAR | Status: DC | PRN
  Administered 2012-01-27 (×2): 25 mg via INTRAVENOUS

## 2012-01-27 NOTE — Op Note (Signed)
EGD PROCEDURE REPORT  PATIENT:  Corey [name]  MR#:  [id number] Birthdate:  October 27, 1950, 62 y.o., male Endoscopist:  Dr. Malissa Hippo, MD Referred By:  Dr. Madelin Rear. Sherwood Gambler, MD Procedure Date: 01/27/2012  Procedure:   EGD  Indications:  Patient is 62 year old Caucasian male who was recently found to have iron deficiency anemia. Had normal screening colonoscopy in December 2011. He denies melena or rectal bleeding and his stool is guaiac-negative. He does complain of intermittent heartburn. He has been donating blood on regular basis. He is undergoing diagnostic EGD.            Informed Consent:  The risks, benefits, alternatives & imponderables which include, but are not limited to, bleeding, infection, perforation, drug reaction and potential missed lesion have been reviewed.  The potential for biopsy, lesion removal, esophageal dilation, etc. have also been discussed.  Questions have been answered.  All parties agreeable.  Please see history & physical in medical record for more information.  Medications:  Demerol 50 mg IV Versed 10 mg IV Cetacaine spray topically for oropharyngeal anesthesia  Description of procedure:  The endoscope was introduced through the mouth and advanced to the second portion of the duodenum without difficulty or limitations. The mucosal surfaces were surveyed very carefully during advancement of the scope and upon withdrawal.  Findings:  Esophagus:  Mucosa of the proximal and middle segment was normal. 3 cm long tubular salmon-colored mucosa with punctate squamous islands noted. Proximal margin was at 34 cm from the incisors. GEJ:  37cm Hiatus:  39 cm Stomach:  Stomach was empty and distended very well with insufflation. Folds in the proximal stomach were normal. Examination of mucosa at body, antrum, pyloric channel, fundus and cardia was normal. Duodenum:  Normal bulbar and post bulbar mucosa.  Therapeutic/Diagnostic Maneuvers Performed:  Multiple  biopsies taken from salmon-colored mucosa at distal esophagus.  Complications:  None  Impression: 3 cm long segment of Barrett's esophagus proximal margin and 34 cm from the incisors.  No evidence of erosive esophagitis. Small sliding hiatal hernia with GEJ at 37 cm. Suspect iron deficiency anemia may be secondary to blood donation.  Recommendations:  Anti-reflux measures. Pantoprazole 40 mg by mouth every morning. Patient advised to hold off blood donation until office visit in 3 months. I will be contacting him with biopsy results.  Aiya Keach U  01/27/2012  10:08 AM  CC: Dr. Cassell Smiles., MD & Dr. Bonnetta Barry ref. provider found

## 2012-01-27 NOTE — H&P (Signed)
Corey [name] is an 62 y.o. male.   Chief Complaint: Patient is here for EGD. HPI: Patient is 17-year-old Caucasian male who was recently found to have iron deficiency anemia. His stools were guaiac negative. He does complain of intermittent heartburn. He has been on low-dose aspirin chronically and recently. He denies abdominal pain melena or rectal bleeding or diarrhea. His he has been donating blood chronically and has never been turned down. She underwent screening colonoscopy in December 2011 and it was normal. Family history is negative for celiac disease or CRC.  Past Medical History  Diagnosis Date  . High cholesterol   . Iron deficiency anemia 12/09/2011  . Depression     Past Surgical History  Procedure Date  . Back surgery   . Rotator cuff repair   . Hernia repair   . Hand surgery     History reviewed. No pertinent family history. Social History:  reports that he has never smoked. He does not have any smokeless tobacco history on file. He reports that he does not drink alcohol or use illicit drugs.  Allergies: No Known Allergies  Medications Prior to Admission  Medication Sig Dispense Refill  . ALPRAZolam (XANAX) 1 MG tablet Take 1 mg by mouth 2 (two) times daily as needed. For anxiety      . buPROPion (WELLBUTRIN SR) 150 MG 12 hr tablet Take 150 mg by mouth daily.      . divalproex (DEPAKOTE) 500 MG DR tablet Take 500 mg by mouth 2 (two) times daily. For headaches      . fish oil-omega-3 fatty acids 1000 MG capsule Take 1 g by mouth 2 (two) times daily.      . iron polysaccharides (POLY-IRON 150) 150 MG capsule Take 150 mg by mouth daily.      . Multiple Vitamins-Minerals (CENTRUM SILVER PO) Take 1 tablet by mouth daily.      . sertraline (ZOLOFT) 100 MG tablet Take 100 mg by mouth daily.      . simvastatin (ZOCOR) 40 MG tablet Take 40 mg by mouth every evening.      . topiramate (TOPAMAX) 100 MG tablet Take 100 mg by mouth at bedtime.      Marland Kitchen aspirin EC 81 MG tablet  Take 81 mg by mouth daily.        No results found for this or any previous visit (from the past 48 hour(s)). No results found.  ROS  Blood pressure 137/79, pulse 62, temperature 98.1 F (36.7 C), temperature source Oral, resp. rate 22, height 5\' 11"  (1.803 m), weight 209 lb (94.802 kg), SpO2 98.00%. Physical Exam  Constitutional: He appears well-developed and well-nourished.  HENT:  Mouth/Throat: Oropharynx is clear and moist.  Eyes: Conjunctivae normal are normal. No scleral icterus.  Neck: No thyromegaly present.  Cardiovascular: Normal rate, regular rhythm and normal heart sounds.   No murmur heard. Respiratory: Effort normal and breath sounds normal.  GI: Soft. He exhibits no distension and no mass. There is no tenderness.  Musculoskeletal: He exhibits no edema.  Lymphadenopathy:    He has no cervical adenopathy.  Neurological: He is alert.  Skin: Skin is warm.     Assessment/Plan Iron deficiency anemia. Normal colonoscopy in December 2011. Diagnostic EGD.  Hermes Wafer U 01/27/2012, 9:40 AM

## 2012-01-31 ENCOUNTER — Encounter (HOSPITAL_COMMUNITY): Payer: Self-pay | Admitting: Internal Medicine

## 2012-02-04 DIAGNOSIS — F331 Major depressive disorder, recurrent, moderate: Secondary | ICD-10-CM | POA: Diagnosis not present

## 2012-02-07 ENCOUNTER — Telehealth (INDEPENDENT_AMBULATORY_CARE_PROVIDER_SITE_OTHER): Payer: Self-pay | Admitting: *Deleted

## 2012-02-07 NOTE — Telephone Encounter (Signed)
Results reviewed with patient. I also called him over the weekend but there was no answer

## 2012-02-07 NOTE — Telephone Encounter (Signed)
Patient left his cell# to try him on if he's not at home, cell# [phone]

## 2012-02-07 NOTE — Telephone Encounter (Signed)
Patient had left message on machine about test results from EGD back 01/27/12.  Please call with these at your convenience.

## 2012-02-09 ENCOUNTER — Encounter (INDEPENDENT_AMBULATORY_CARE_PROVIDER_SITE_OTHER): Payer: Self-pay | Admitting: *Deleted

## 2012-02-22 DIAGNOSIS — R51 Headache: Secondary | ICD-10-CM | POA: Diagnosis not present

## 2012-02-22 DIAGNOSIS — Z79899 Other long term (current) drug therapy: Secondary | ICD-10-CM | POA: Diagnosis not present

## 2012-03-11 ENCOUNTER — Other Ambulatory Visit: Payer: Self-pay

## 2012-03-13 DIAGNOSIS — G43009 Migraine without aura, not intractable, without status migrainosus: Secondary | ICD-10-CM | POA: Diagnosis not present

## 2012-03-13 DIAGNOSIS — M47812 Spondylosis without myelopathy or radiculopathy, cervical region: Secondary | ICD-10-CM | POA: Diagnosis not present

## 2012-04-10 DIAGNOSIS — N189 Chronic kidney disease, unspecified: Secondary | ICD-10-CM | POA: Diagnosis not present

## 2012-04-19 DIAGNOSIS — R51 Headache: Secondary | ICD-10-CM | POA: Diagnosis not present

## 2012-04-19 DIAGNOSIS — Z79899 Other long term (current) drug therapy: Secondary | ICD-10-CM | POA: Diagnosis not present

## 2012-04-27 DIAGNOSIS — G43719 Chronic migraine without aura, intractable, without status migrainosus: Secondary | ICD-10-CM | POA: Diagnosis not present

## 2012-05-19 DIAGNOSIS — Z79899 Other long term (current) drug therapy: Secondary | ICD-10-CM | POA: Diagnosis not present

## 2012-05-19 DIAGNOSIS — G43919 Migraine, unspecified, intractable, without status migrainosus: Secondary | ICD-10-CM | POA: Diagnosis not present

## 2012-05-19 DIAGNOSIS — R51 Headache: Secondary | ICD-10-CM | POA: Diagnosis not present

## 2012-06-02 DIAGNOSIS — F331 Major depressive disorder, recurrent, moderate: Secondary | ICD-10-CM | POA: Diagnosis not present

## 2012-06-13 DIAGNOSIS — R5381 Other malaise: Secondary | ICD-10-CM | POA: Diagnosis not present

## 2012-06-13 DIAGNOSIS — D1739 Benign lipomatous neoplasm of skin and subcutaneous tissue of other sites: Secondary | ICD-10-CM | POA: Diagnosis not present

## 2012-06-13 DIAGNOSIS — E291 Testicular hypofunction: Secondary | ICD-10-CM | POA: Diagnosis not present

## 2012-06-13 DIAGNOSIS — Z6828 Body mass index (BMI) 28.0-28.9, adult: Secondary | ICD-10-CM | POA: Diagnosis not present

## 2012-06-27 DIAGNOSIS — D492 Neoplasm of unspecified behavior of bone, soft tissue, and skin: Secondary | ICD-10-CM | POA: Diagnosis not present

## 2012-06-27 NOTE — H&P (Signed)
  NTS SOAP Note  Vital Signs:  Vitals as of: 06/27/2012: Systolic 115: Diastolic 70: Heart Rate 83: Temp 98.8F: Height 31ft 11in: Weight 205Lbs 0 Ounces: BMI 28.59  BMI : 28.59 kg/m2  Subjective: This 62 Years 81 Months old Male presents for of a mass in the left thigh.  Was removed in the remote past, but seems to have recurred.  Tender to touch.  Seems to be increasing in size.  Review of Symptoms:  Constitutional:unremarkable      headache Eyes:unremarkable   Nose/Mouth/Throat:unremarkable Cardiovascular:  unremarkable   Respiratory:unremarkable   Gastrointestinal:  unremarkable   Genitourinary:unremarkable     Musculoskeletal:unremarkable   Hematolgic/Lymphatic:unremarkable     Allergic/Immunologic:unremarkable     Past Medical History:    Reviewed   Past Medical History  Medical Problems:  High Blood pressure, High cholesterol Psychiatric History:  Anxiety, Depression Allergies: nkda Medications: baby asa, xanax, bumex, sertraline, toprimate, simvastatin, depakote, protonix   Social History:Reviewed  Social History  Preferred Language: English Race:  White Ethnicity: Not Hispanic / Latino Age: 62 Years 6 Months Marital Status:  M Alcohol:  No Recreational drug(s):  No   Smoking Status: Never smoker reviewed on 06/27/2012 Functional Status reviewed on mm/dd/yyyy ------------------------------------------------ Bathing: Normal Cooking: Normal Dressing: Normal Driving: Normal Eating: Normal Managing Meds: Normal Oral Care: Normal Shopping: Normal Toileting: Normal Transferring: Normal Walking: Normal Cognitive Status reviewed on mm/dd/yyyy ------------------------------------------------ Attention: Normal Decision Making: Normal Language: Normal Memory: Normal Motor: Normal Perception: Normal Problem Solving: Normal Visual and Spatial: Normal   Family History:  Reviewed  Family Health History Mother,  Deceased; Healthy;  Father, Deceased; Coronary arteriosclerosis;     Objective Information: General:  Well appearing, well nourished in no distress. Heart:  RRR, no murmur Lungs:    CTA bilaterally, no wheezes, rhonchi, rales.  Breathing unlabored.   3 -4 cm ovoid, soft tissue mass underneath a surgical scar, left posterior thigh.  Mobile.  Assessment:Soft tissue, neoplasm, left thigh  Diagnosis &amp; Procedure Smart Code   Plan:Scheduled for excision of soft tissue neoplasm, left thigh on 07/14/12.   Patient Education:Alternative treatments to surgery were discussed with patient (and family).  Risks and benefits  of procedure were fully explained to the patient (and family) who gave informed consent. Patient/family questions were addressed.  Follow-up:Pending Surgery

## 2012-06-28 ENCOUNTER — Encounter (HOSPITAL_COMMUNITY): Payer: Self-pay | Admitting: Pharmacy Technician

## 2012-07-10 NOTE — Patient Instructions (Signed)
Corey [name]  07/10/2012   Your procedure is scheduled on:  07/14/12  Report to [facility] at 06:15 AM.  Call this number if you have problems the morning of surgery: [phone]   Remember:   Do not eat food or drink liquids after midnight.   Take these medicines the morning of surgery with A SIP OF WATER: Wellbutrin, Depakote, Protonix, Zoloft, Topamax and Xanax as needed.   Do not wear jewelry, make-up or nail polish.  Do not wear lotions, powders, or perfumes. You may wear deodorant.  Do not shave 48 hours prior to surgery. Men may shave face and neck.  Do not bring valuables to the hospital.  [facility] is not responsible for any belongings or valuables.  Contacts, dentures or bridgework may not be worn into surgery.  Leave suitcase in the car. After surgery it may be brought to your room.  For patients admitted to the hospital, checkout time is 11:00 AM the day of discharge.   Patients discharged the day of surgery will not be allowed to drive home.    Special Instructions: Shower using CHG 2 nights before surgery and the night before surgery.  If you shower the day of surgery use CHG.  Use special wash - you have one bottle of CHG for all showers.  You should use approximately 1/3 of the bottle for each shower.   Please read over the following fact sheets that you were given: Pain Booklet, MRSA Information, Surgical Site Infection Prevention, Anesthesia Post-op Instructions and Care and Recovery After Surgery    PATIENT INSTRUCTIONS POST-ANESTHESIA  IMMEDIATELY FOLLOWING SURGERY:  Do not drive or operate machinery for the first twenty four hours after surgery.  Do not make any important decisions for twenty four hours after surgery or while taking narcotic pain medications or sedatives.  If you develop intractable nausea and vomiting or a severe headache please notify your doctor immediately.  FOLLOW-UP:  Please make an appointment with your surgeon as instructed. You do not  need to follow up with anesthesia unless specifically instructed to do so.  WOUND CARE INSTRUCTIONS (if applicable):  Keep a dry clean dressing on the anesthesia/puncture wound site if there is drainage.  Once the wound has quit draining you may leave it open to air.  Generally you should leave the bandage intact for twenty four hours unless there is drainage.  If the epidural site drains for more than 36-48 hours please call the anesthesia department.  QUESTIONS?:  Please feel free to call your physician or the hospital operator if you have any questions, and they will be happy to assist you.

## 2012-07-11 ENCOUNTER — Encounter (HOSPITAL_COMMUNITY)
Admission: RE | Admit: 2012-07-11 | Discharge: 2012-07-11 | Disposition: A | Discharge status: Home / Self Care | Payer: PRIVATE HEALTH INSURANCE | Source: Ambulatory Visit

## 2012-07-13 ENCOUNTER — Other Ambulatory Visit: Payer: Self-pay

## 2012-07-13 ENCOUNTER — Encounter (HOSPITAL_COMMUNITY): Payer: Self-pay

## 2012-07-13 ENCOUNTER — Encounter (HOSPITAL_COMMUNITY)
Admission: RE | Admit: 2012-07-13 | Discharge: 2012-07-13 | Disposition: A | Discharge status: Home / Self Care | Payer: PRIVATE HEALTH INSURANCE | Source: Ambulatory Visit | Attending: General Surgery | Admitting: General Surgery

## 2012-07-13 DIAGNOSIS — Z01810 Encounter for preprocedural cardiovascular examination: Secondary | ICD-10-CM | POA: Diagnosis not present

## 2012-07-13 DIAGNOSIS — I10 Essential (primary) hypertension: Secondary | ICD-10-CM | POA: Diagnosis not present

## 2012-07-13 DIAGNOSIS — Z79899 Other long term (current) drug therapy: Secondary | ICD-10-CM | POA: Diagnosis not present

## 2012-07-13 DIAGNOSIS — Z01812 Encounter for preprocedural laboratory examination: Secondary | ICD-10-CM | POA: Diagnosis not present

## 2012-07-13 DIAGNOSIS — D2370 Other benign neoplasm of skin of unspecified lower limb, including hip: Secondary | ICD-10-CM | POA: Diagnosis not present

## 2012-07-13 HISTORY — DX: Migraine, unspecified, not intractable, without status migrainosus: G43.909

## 2012-07-13 HISTORY — DX: Unspecified osteoarthritis, unspecified site: M19.90

## 2012-07-13 HISTORY — DX: Tinnitus, unspecified ear: H93.19

## 2012-07-13 LAB — CBC WITH DIFFERENTIAL/PLATELET
Basophils Absolute: 0.1 10*3/uL (ref 0.0–0.1)
Basophils Relative: 1 % (ref 0–1)
Eosinophils Absolute: 0.2 10*3/uL (ref 0.0–0.7)
Eosinophils Relative: 2 % (ref 0–5)
HCT: 48.2 % (ref 39.0–52.0)
Hemoglobin: 16.3 g/dL (ref 13.0–17.0)
MCH: 29.5 pg (ref 26.0–34.0)
MCHC: 33.8 g/dL (ref 30.0–36.0)
Monocytes Absolute: 1.1 10*3/uL — ABNORMAL HIGH (ref 0.1–1.0)
Monocytes Relative: 11 % (ref 3–12)
RDW: 13.9 % (ref 11.5–15.5)

## 2012-07-13 LAB — BASIC METABOLIC PANEL
BUN: 11 mg/dL (ref 6–23)
Chloride: 104 mEq/L (ref 96–112)
Creatinine, Ser: 1.41 mg/dL — ABNORMAL HIGH (ref 0.50–1.35)
GFR calc Af Amer: 61 mL/min — ABNORMAL LOW (ref >90)
Glucose, Bld: 97 mg/dL (ref 70–99)
Potassium: 4.3 mEq/L (ref 3.5–5.1)

## 2012-07-13 LAB — SURGICAL PCR SCREEN: MRSA, PCR: NEGATIVE

## 2012-07-13 MED ORDER — CHLORHEXIDINE GLUCONATE 4 % EX LIQD: 1.0000 "application " | Freq: Once | CUTANEOUS | Status: DC

## 2012-07-13 NOTE — Patient Instructions (Addendum)
      BRECKON REEVES  07/13/2012   Your procedure is scheduled on:   07/14/2012   Report to [facility] at  615  AM.  Call this number if you have problems the morning of surgery: [phone]   Remember:   Do not eat food or drink liquids after midnight.   Take these medicines the morning of surgery with A SIP OF WATER:  Xanax,wellbutrin, depakote, protonix, zoloft, topamax   Do not wear jewelry, make-up or nail polish.  Do not wear lotions, powders, or perfumes.   Do not shave 48 hours prior to surgery. Men may shave face and neck.  Do not bring valuables to the hospital.  [facility] is not responsible  for any belongings or valuables.  Contacts, dentures or bridgework may not be worn into surgery.  Leave suitcase in the car. After surgery it may be brought to your room.  For patients admitted to the hospital, checkout time is 11:00 AM the day of discharge.   Patients discharged the day of surgery will not be allowed to drive  home.  Name and phone number of your driver: family  Special Instructions: Shower using CHG 2 nights before surgery and the night before surgery.  If you shower the day of surgery use CHG.  Use special wash - you have one bottle of CHG for all showers.  You should use approximately 1/3 of the bottle for each shower.   Please read over the following fact sheets that you were given: Pain Booklet, Coughing and Deep Breathing, MRSA Information, Surgical Site Infection Prevention, Anesthesia Post-op Instructions and Care and Recovery After Surgery PATIENT INSTRUCTIONS POST-ANESTHESIA  IMMEDIATELY FOLLOWING SURGERY:  Do not drive or operate machinery for the first twenty four hours after surgery.  Do not make any important decisions for twenty four hours after surgery or while taking narcotic pain medications or sedatives.  If you develop intractable nausea and vomiting or a severe headache please notify your doctor immediately.  FOLLOW-UP:  Please make an appointment  with your surgeon as instructed. You do not need to follow up with anesthesia unless specifically instructed to do so.  WOUND CARE INSTRUCTIONS (if applicable):  Keep a dry clean dressing on the anesthesia/puncture wound site if there is drainage.  Once the wound has quit draining you may leave it open to air.  Generally you should leave the bandage intact for twenty four hours unless there is drainage.  If the epidural site drains for more than 36-48 hours please call the anesthesia department.  QUESTIONS?:  Please feel free to call your physician or the hospital operator if you have any questions, and they will be happy to assist you.

## 2012-07-14 ENCOUNTER — Encounter (HOSPITAL_COMMUNITY): Payer: Self-pay | Admitting: *Deleted

## 2012-07-14 ENCOUNTER — Ambulatory Visit (HOSPITAL_COMMUNITY): Payer: PRIVATE HEALTH INSURANCE | Admitting: Anesthesiology

## 2012-07-14 ENCOUNTER — Encounter (HOSPITAL_COMMUNITY): Payer: Self-pay | Admitting: Anesthesiology

## 2012-07-14 ENCOUNTER — Ambulatory Visit (HOSPITAL_COMMUNITY)
Admission: RE | Admit: 2012-07-14 | Discharge: 2012-07-14 | Disposition: A | Discharge status: Home / Self Care | Payer: PRIVATE HEALTH INSURANCE | Source: Ambulatory Visit | Attending: General Surgery | Admitting: General Surgery

## 2012-07-14 ENCOUNTER — Encounter (HOSPITAL_COMMUNITY)
Admission: RE | Disposition: A | Discharge status: Home / Self Care | Payer: Self-pay | Source: Ambulatory Visit | Attending: General Surgery

## 2012-07-14 DIAGNOSIS — D2370 Other benign neoplasm of skin of unspecified lower limb, including hip: Secondary | ICD-10-CM | POA: Diagnosis not present

## 2012-07-14 DIAGNOSIS — Z01810 Encounter for preprocedural cardiovascular examination: Secondary | ICD-10-CM | POA: Diagnosis not present

## 2012-07-14 DIAGNOSIS — I10 Essential (primary) hypertension: Secondary | ICD-10-CM | POA: Insufficient documentation

## 2012-07-14 DIAGNOSIS — Z01812 Encounter for preprocedural laboratory examination: Secondary | ICD-10-CM | POA: Insufficient documentation

## 2012-07-14 DIAGNOSIS — Z79899 Other long term (current) drug therapy: Secondary | ICD-10-CM | POA: Insufficient documentation

## 2012-07-14 DIAGNOSIS — R229 Localized swelling, mass and lump, unspecified: Secondary | ICD-10-CM | POA: Diagnosis not present

## 2012-07-14 HISTORY — PX: MASS EXCISION: SHX2000

## 2012-07-14 SURGERY — EXCISION MASS
Anesthesia: General | Site: Leg Upper | Laterality: Left | Wound class: Clean

## 2012-07-14 MED ORDER — MUPIROCIN CALCIUM 2 % EX CREA
TOPICAL_CREAM | Freq: Two times a day (BID) | CUTANEOUS | Status: DC
  Administered 2012-07-14: 1 via TOPICAL
  Filled 2012-07-14: qty 15

## 2012-07-14 MED ORDER — BUPIVACAINE HCL (PF) 0.5 % IJ SOLN
INTRAMUSCULAR | Status: AC
  Filled 2012-07-14: qty 30

## 2012-07-14 MED ORDER — BUPIVACAINE HCL 0.5 % IJ SOLN
INTRAMUSCULAR | Status: DC | PRN
  Administered 2012-07-14: 4 mL

## 2012-07-14 MED ORDER — KETOROLAC TROMETHAMINE 30 MG/ML IJ SOLN
INTRAMUSCULAR | Status: AC
  Filled 2012-07-14: qty 1

## 2012-07-14 MED ORDER — EPHEDRINE SULFATE 50 MG/ML IJ SOLN
INTRAMUSCULAR | Status: AC
  Filled 2012-07-14: qty 1

## 2012-07-14 MED ORDER — MIDAZOLAM HCL 2 MG/2ML IJ SOLN
1.0000 mg | INTRAMUSCULAR | Status: DC | PRN
  Administered 2012-07-14: 2 mg via INTRAVENOUS

## 2012-07-14 MED ORDER — PROPOFOL 10 MG/ML IV BOLUS
INTRAVENOUS | Status: DC | PRN
  Administered 2012-07-14: 150 mg via INTRAVENOUS

## 2012-07-14 MED ORDER — FENTANYL CITRATE 0.05 MG/ML IJ SOLN
INTRAMUSCULAR | Status: AC
  Filled 2012-07-14: qty 2

## 2012-07-14 MED ORDER — ONDANSETRON HCL 4 MG/2ML IJ SOLN
4.0000 mg | Freq: Once | INTRAMUSCULAR | Status: AC
  Administered 2012-07-14: 4 mg via INTRAVENOUS

## 2012-07-14 MED ORDER — MUPIROCIN 2 % EX OINT
TOPICAL_OINTMENT | CUTANEOUS | Status: AC
  Filled 2012-07-14: qty 22

## 2012-07-14 MED ORDER — LIDOCAINE HCL (PF) 1 % IJ SOLN
INTRAMUSCULAR | Status: AC
  Filled 2012-07-14: qty 5

## 2012-07-14 MED ORDER — PROPOFOL 10 MG/ML IV EMUL
INTRAVENOUS | Status: AC
  Filled 2012-07-14: qty 20

## 2012-07-14 MED ORDER — KETOROLAC TROMETHAMINE 30 MG/ML IJ SOLN
30.0000 mg | Freq: Once | INTRAMUSCULAR | Status: AC
  Administered 2012-07-14: 30 mg via INTRAVENOUS

## 2012-07-14 MED ORDER — EPHEDRINE SULFATE 50 MG/ML IJ SOLN
INTRAMUSCULAR | Status: DC | PRN
  Administered 2012-07-14: 10 mg via INTRAVENOUS

## 2012-07-14 MED ORDER — LACTATED RINGERS IV SOLN
INTRAVENOUS | Status: DC
  Administered 2012-07-14 (×2): via INTRAVENOUS

## 2012-07-14 MED ORDER — SODIUM CHLORIDE 0.9 % IR SOLN
Status: DC | PRN
  Administered 2012-07-14: 1000 mL

## 2012-07-14 MED ORDER — MIDAZOLAM HCL 2 MG/2ML IJ SOLN
INTRAMUSCULAR | Status: AC
  Filled 2012-07-14: qty 2

## 2012-07-14 MED ORDER — ONDANSETRON HCL 4 MG/2ML IJ SOLN: 4.0000 mg | Freq: Once | INTRAMUSCULAR | Status: DC | PRN

## 2012-07-14 MED ORDER — HYDROCODONE-ACETAMINOPHEN 5-325 MG PO TABS: 1.0000 | ORAL_TABLET | ORAL | Status: DC | PRN

## 2012-07-14 MED ORDER — LIDOCAINE HCL (CARDIAC) 10 MG/ML IV SOLN
INTRAVENOUS | Status: DC | PRN
  Administered 2012-07-14: 50 mg via INTRAVENOUS

## 2012-07-14 MED ORDER — FENTANYL CITRATE 0.05 MG/ML IJ SOLN
25.0000 ug | INTRAMUSCULAR | Status: DC | PRN
  Administered 2012-07-14 (×4): 50 ug via INTRAVENOUS

## 2012-07-14 MED ORDER — FENTANYL CITRATE 0.05 MG/ML IJ SOLN
INTRAMUSCULAR | Status: DC | PRN
  Administered 2012-07-14: 25 ug via INTRAVENOUS
  Administered 2012-07-14: 50 ug via INTRAVENOUS
  Administered 2012-07-14: 25 ug via INTRAVENOUS

## 2012-07-14 MED ORDER — ONDANSETRON HCL 4 MG/2ML IJ SOLN
INTRAMUSCULAR | Status: AC
  Filled 2012-07-14: qty 2

## 2012-07-14 SURGICAL SUPPLY — 26 items
BAG HAMPER (MISCELLANEOUS) ×2 IMPLANT
CLOTH BEACON ORANGE TIMEOUT ST (SAFETY) ×2 IMPLANT
COVER LIGHT HANDLE STERIS (MISCELLANEOUS) ×4 IMPLANT
DECANTER SPIKE VIAL GLASS SM (MISCELLANEOUS) ×2 IMPLANT
DERMABOND ADVANCED (GAUZE/BANDAGES/DRESSINGS) ×1
DERMABOND ADVANCED .7 DNX12 (GAUZE/BANDAGES/DRESSINGS) ×1 IMPLANT
DURAPREP 26ML APPLICATOR (WOUND CARE) ×2 IMPLANT
ELECT NEEDLE TIP 2.8 STRL (NEEDLE) IMPLANT
ELECT REM PT RETURN 9FT ADLT (ELECTROSURGICAL) ×2
ELECTRODE REM PT RTRN 9FT ADLT (ELECTROSURGICAL) ×1 IMPLANT
FORMALIN 10 PREFIL 120ML (MISCELLANEOUS) ×2 IMPLANT
GLOVE BIO SURGEON STRL SZ7.5 (GLOVE) ×6 IMPLANT
GOWN STRL REIN XL XLG (GOWN DISPOSABLE) ×6 IMPLANT
KIT ROOM TURNOVER APOR (KITS) ×2 IMPLANT
MANIFOLD NEPTUNE II (INSTRUMENTS) ×2 IMPLANT
NEEDLE HYPO 25X1 1.5 SAFETY (NEEDLE) ×2 IMPLANT
NS IRRIG 1000ML POUR BTL (IV SOLUTION) ×2 IMPLANT
PACK MINOR (CUSTOM PROCEDURE TRAY) ×2 IMPLANT
PAD ARMBOARD 7.5X6 YLW CONV (MISCELLANEOUS) ×6 IMPLANT
SET BASIN LINEN APH (SET/KITS/TRAYS/PACK) ×2 IMPLANT
SUT ETHILON 3 0 FSL (SUTURE) IMPLANT
SUT PROLENE 4 0 PS 2 18 (SUTURE) IMPLANT
SUT VIC AB 3-0 SH 27 (SUTURE) ×1
SUT VIC AB 3-0 SH 27X BRD (SUTURE) ×1 IMPLANT
SUT VIC AB 4-0 PS2 27 (SUTURE) ×2 IMPLANT
SYR CONTROL 10ML LL (SYRINGE) ×2 IMPLANT

## 2012-07-14 NOTE — Transfer of Care (Signed)
Immediate Anesthesia Transfer of Care Note  Patient: Corey [name]  Procedure(s) Performed: Procedure(s): EXCISION SOFT TISSUE MASS LEG (Left)  Patient Location: PACU  Anesthesia Type:General  Level of Consciousness: sedated and patient cooperative  Airway & Oxygen Therapy: Patient Spontanous Breathing and Patient connected to face mask oxygen  Post-op Assessment: Report given to PACU RN and Post -op Vital signs reviewed and stable  Post vital signs: Reviewed and stable  Complications: No apparent anesthesia complications

## 2012-07-14 NOTE — Preoperative (Signed)
Beta Blockers   Reason not to administer Beta Blockers:Not Applicable 

## 2012-07-14 NOTE — Anesthesia Procedure Notes (Signed)
Procedure Name: LMA Insertion Date/Time: 07/14/2012 7:37 AM Performed by: Carolyne Littles, Ursula Dermody L Pre-anesthesia Checklist: Patient identified, Timeout performed, Emergency Drugs available, Suction available and Patient being monitored Patient Re-evaluated:Patient Re-evaluated prior to inductionOxygen Delivery Method: Circle system utilized Preoxygenation: Pre-oxygenation with 100% oxygen Intubation Type: IV induction Ventilation: Mask ventilation without difficulty LMA: LMA inserted LMA Size: 4.0 Number of attempts: 1 Placement Confirmation: positive ETCO2 and breath sounds checked- equal and bilateral Tube secured with: Tape Dental Injury: Teeth and Oropharynx as per pre-operative assessment

## 2012-07-14 NOTE — Op Note (Signed)
Patient:  Corey [name]  DOB:  1950/02/20  MRN:  [id number]   Preop Diagnosis:  Subcutaneous neoplasm, left thigh  Postop Diagnosis:  Same  Procedure:  Excision of subcutaneous neoplasm, left thigh  Surgeon:  Franky Macho, M.D.  Anes:  General  Indications:  Patient is a 62 year old white male status post excision of subcutaneous neoplasm approximately 10 years ago in the left posterior thigh now presents with a recurrence of the mass. Recent growth and tenderness have been noted. The risks and benefits of the procedure were fully explained to the patient, gave informed consent.  Procedure note:  The patient was placed in the right lateral decubitus position after general anesthesia was administered. The left posterior thigh was prepped and draped using usual sterile technique with DuraPrep. Surgical site confirmation was performed.  A transverse incision was made through the previous surgical scar. A 4 cm lobulated lipoma was found. This did not infiltrate into the underlying fascia. It was removed in total without difficulty. It was disposed of. 0.5% Sensorcaine was instilled the surrounding wound. The subcutaneous layer was reapproximated using 3-0 Vicryl interrupted suture. The skin was closed using a 4 Vicryl subcuticular suture. Dermabond was then applied.  All tape and needle counts were correct at the end of the procedure. The patient was awakened and transferred to PACU in stable condition.  Complications:  None  EBL:  Minimal  Specimen:  None

## 2012-07-14 NOTE — Anesthesia Postprocedure Evaluation (Signed)
  Anesthesia Post-op Note  Patient: Corey [name]  Procedure(s) Performed: Procedure(s): EXCISION SOFT TISSUE MASS LEG (Left)  Patient Location: PACU  Anesthesia Type:General  Level of Consciousness: sedated and patient cooperative  Airway and Oxygen Therapy: Patient Spontanous Breathing and Patient connected to face mask oxygen  Post-op Pain: mild  Post-op Assessment: Post-op Vital signs reviewed, Patient's Cardiovascular Status Stable, Respiratory Function Stable, Patent Airway, No signs of Nausea or vomiting and Pain level controlled  Post-op Vital Signs: Reviewed and stable  Complications: No apparent anesthesia complications

## 2012-07-14 NOTE — Interval H&P Note (Signed)
History and Physical Interval Note:  07/14/2012 7:25 AM  Corey [name]  has presented today for surgery, with the diagnosis of soft tissue mass left leg  The various methods of treatment have been discussed with the patient and family. After consideration of risks, benefits and other options for treatment, the patient has consented to  Procedure(s): EXCISION SOFT TISSUE MASS LEG (Left) as a surgical intervention .  The patient's history has been reviewed, patient examined, no change in status, stable for surgery.  I have reviewed the patient's chart and labs.  Questions were answered to the patient's satisfaction.     Franky Macho A

## 2012-07-14 NOTE — Anesthesia Preprocedure Evaluation (Signed)
Anesthesia Evaluation  Patient identified by MRN, date of birth, ID band Patient awake    Reviewed: Allergy & Precautions, H&P , NPO status , Patient's Chart, lab work & pertinent test results  History of Anesthesia Complications Negative for: history of anesthetic complications  Airway Mallampati: I TM Distance: >3 FB Neck ROM: Full    Dental  (+) Teeth Intact, Partial Upper and Implants   Pulmonary neg pulmonary ROS,  breath sounds clear to auscultation        Cardiovascular negative cardio ROS  Rhythm:Regular Rate:Normal     Neuro/Psych  Headaches, PSYCHIATRIC DISORDERS Depression    GI/Hepatic   Endo/Other    Renal/GU      Musculoskeletal   Abdominal   Peds  Hematology   Anesthesia Other Findings   Reproductive/Obstetrics                           Anesthesia Physical Anesthesia Plan  ASA: II  Anesthesia Plan: General   Post-op Pain Management:    Induction: Intravenous  Airway Management Planned: LMA  Additional Equipment:   Intra-op Plan:   Post-operative Plan: Extubation in OR  Informed Consent: I have reviewed the patients History and Physical, chart, labs and discussed the procedure including the risks, benefits and alternatives for the proposed anesthesia with the patient or authorized representative who has indicated his/her understanding and acceptance.     Plan Discussed with:   Anesthesia Plan Comments:         Anesthesia Quick Evaluation

## 2012-07-17 ENCOUNTER — Encounter (HOSPITAL_COMMUNITY): Payer: Self-pay | Admitting: General Surgery

## 2012-07-18 DIAGNOSIS — G43919 Migraine, unspecified, intractable, without status migrainosus: Secondary | ICD-10-CM | POA: Diagnosis not present

## 2012-07-18 DIAGNOSIS — Z79899 Other long term (current) drug therapy: Secondary | ICD-10-CM | POA: Diagnosis not present

## 2012-07-18 DIAGNOSIS — R51 Headache: Secondary | ICD-10-CM | POA: Diagnosis not present

## 2012-07-21 DIAGNOSIS — G43719 Chronic migraine without aura, intractable, without status migrainosus: Secondary | ICD-10-CM | POA: Diagnosis not present

## 2012-08-15 DIAGNOSIS — H2510 Age-related nuclear cataract, unspecified eye: Secondary | ICD-10-CM | POA: Diagnosis not present

## 2012-08-28 DIAGNOSIS — F331 Major depressive disorder, recurrent, moderate: Secondary | ICD-10-CM | POA: Diagnosis not present

## 2012-09-04 DIAGNOSIS — I10 Essential (primary) hypertension: Secondary | ICD-10-CM | POA: Diagnosis not present

## 2012-09-04 DIAGNOSIS — D649 Anemia, unspecified: Secondary | ICD-10-CM | POA: Diagnosis not present

## 2012-09-04 DIAGNOSIS — R809 Proteinuria, unspecified: Secondary | ICD-10-CM | POA: Diagnosis not present

## 2012-09-04 DIAGNOSIS — Z79899 Other long term (current) drug therapy: Secondary | ICD-10-CM | POA: Diagnosis not present

## 2012-09-04 DIAGNOSIS — N189 Chronic kidney disease, unspecified: Secondary | ICD-10-CM | POA: Diagnosis not present

## 2012-09-11 ENCOUNTER — Ambulatory Visit: Payer: Self-pay | Admitting: Nurse Practitioner

## 2012-10-19 ENCOUNTER — Other Ambulatory Visit: Payer: Self-pay

## 2012-10-19 MED ORDER — TOPIRAMATE 100 MG PO TABS: 100.0000 mg | ORAL_TABLET | Freq: Every day | ORAL | Status: DC

## 2012-10-26 DIAGNOSIS — F331 Major depressive disorder, recurrent, moderate: Secondary | ICD-10-CM | POA: Diagnosis not present

## 2012-11-09 DIAGNOSIS — F331 Major depressive disorder, recurrent, moderate: Secondary | ICD-10-CM | POA: Diagnosis not present

## 2012-11-23 DIAGNOSIS — F331 Major depressive disorder, recurrent, moderate: Secondary | ICD-10-CM | POA: Diagnosis not present

## 2012-11-30 ENCOUNTER — Other Ambulatory Visit: Payer: Self-pay

## 2013-01-24 ENCOUNTER — Emergency Department (HOSPITAL_COMMUNITY): Payer: PRIVATE HEALTH INSURANCE

## 2013-01-24 ENCOUNTER — Inpatient Hospital Stay (HOSPITAL_COMMUNITY)
Admission: EM | Admit: 2013-01-24 | Discharge: 2013-01-26 | DRG: 871 | Disposition: A | Discharge status: Home / Self Care | Payer: PRIVATE HEALTH INSURANCE | Attending: Family Medicine | Admitting: Family Medicine

## 2013-01-24 ENCOUNTER — Encounter (HOSPITAL_COMMUNITY): Payer: Self-pay | Admitting: Emergency Medicine

## 2013-01-24 DIAGNOSIS — E876 Hypokalemia: Secondary | ICD-10-CM | POA: Diagnosis present

## 2013-01-24 DIAGNOSIS — R188 Other ascites: Secondary | ICD-10-CM | POA: Diagnosis not present

## 2013-01-24 DIAGNOSIS — G43909 Migraine, unspecified, not intractable, without status migrainosus: Secondary | ICD-10-CM | POA: Diagnosis present

## 2013-01-24 DIAGNOSIS — Z7982 Long term (current) use of aspirin: Secondary | ICD-10-CM

## 2013-01-24 DIAGNOSIS — F329 Major depressive disorder, single episode, unspecified: Secondary | ICD-10-CM | POA: Diagnosis present

## 2013-01-24 DIAGNOSIS — E7800 Pure hypercholesterolemia, unspecified: Secondary | ICD-10-CM | POA: Diagnosis present

## 2013-01-24 DIAGNOSIS — E785 Hyperlipidemia, unspecified: Secondary | ICD-10-CM | POA: Diagnosis present

## 2013-01-24 DIAGNOSIS — F3289 Other specified depressive episodes: Secondary | ICD-10-CM | POA: Diagnosis present

## 2013-01-24 DIAGNOSIS — A419 Sepsis, unspecified organism: Principal | ICD-10-CM | POA: Diagnosis present

## 2013-01-24 DIAGNOSIS — J189 Pneumonia, unspecified organism: Secondary | ICD-10-CM

## 2013-01-24 DIAGNOSIS — R6510 Systemic inflammatory response syndrome (SIRS) of non-infectious origin without acute organ dysfunction: Secondary | ICD-10-CM | POA: Diagnosis not present

## 2013-01-24 DIAGNOSIS — N179 Acute kidney failure, unspecified: Secondary | ICD-10-CM | POA: Diagnosis not present

## 2013-01-24 DIAGNOSIS — N183 Chronic kidney disease, stage 3 unspecified: Secondary | ICD-10-CM | POA: Diagnosis not present

## 2013-01-24 DIAGNOSIS — D509 Iron deficiency anemia, unspecified: Secondary | ICD-10-CM | POA: Diagnosis present

## 2013-01-24 DIAGNOSIS — J159 Unspecified bacterial pneumonia: Secondary | ICD-10-CM | POA: Diagnosis present

## 2013-01-24 DIAGNOSIS — IMO0002 Reserved for concepts with insufficient information to code with codable children: Secondary | ICD-10-CM | POA: Diagnosis present

## 2013-01-24 DIAGNOSIS — G9340 Encephalopathy, unspecified: Secondary | ICD-10-CM | POA: Diagnosis present

## 2013-01-24 DIAGNOSIS — M129 Arthropathy, unspecified: Secondary | ICD-10-CM | POA: Diagnosis present

## 2013-01-24 DIAGNOSIS — R4182 Altered mental status, unspecified: Secondary | ICD-10-CM

## 2013-01-24 LAB — CBC WITH DIFFERENTIAL/PLATELET
Basophils Absolute: 0 10*3/uL (ref 0.0–0.1)
Basophils Relative: 0 % (ref 0–1)
Eosinophils Relative: 0 % (ref 0–5)
HCT: 45.5 % (ref 39.0–52.0)
Lymphocytes Relative: 6 % — ABNORMAL LOW (ref 12–46)
MCHC: 33.6 g/dL (ref 30.0–36.0)
MCV: 85.5 fL (ref 78.0–100.0)
Monocytes Absolute: 1.5 10*3/uL — ABNORMAL HIGH (ref 0.1–1.0)
RDW: 14 % (ref 11.5–15.5)

## 2013-01-24 LAB — BLOOD GAS, ARTERIAL
Acid-base deficit: 5 mmol/L — ABNORMAL HIGH (ref 0.0–2.0)
Bicarbonate: 18.8 mEq/L — ABNORMAL LOW (ref 20.0–24.0)
Patient temperature: 37
TCO2: 16.5 mmol/L (ref 0–100)

## 2013-01-24 LAB — COMPREHENSIVE METABOLIC PANEL
Alkaline Phosphatase: 64 U/L (ref 39–117)
BUN: 17 mg/dL (ref 6–23)
CO2: 24 mEq/L (ref 19–32)
GFR calc Af Amer: 57 mL/min — ABNORMAL LOW (ref >90)
GFR calc non Af Amer: 49 mL/min — ABNORMAL LOW (ref >90)
Glucose, Bld: 128 mg/dL — ABNORMAL HIGH (ref 70–99)
Potassium: 4.2 mEq/L (ref 3.7–5.3)
Total Protein: 7.2 g/dL (ref 6.0–8.3)

## 2013-01-24 LAB — RAPID URINE DRUG SCREEN, HOSP PERFORMED
Amphetamines: NOT DETECTED
Barbiturates: NOT DETECTED
Benzodiazepines: POSITIVE — AB
Cocaine: NOT DETECTED
Opiates: POSITIVE — AB
Tetrahydrocannabinol: NOT DETECTED

## 2013-01-24 LAB — URINALYSIS, ROUTINE W REFLEX MICROSCOPIC
Ketones, ur: NEGATIVE mg/dL
Leukocytes, UA: NEGATIVE
Nitrite: NEGATIVE
Protein, ur: NEGATIVE mg/dL
Urobilinogen, UA: 0.2 mg/dL (ref 0.0–1.0)
pH: 7 (ref 5.0–8.0)

## 2013-01-24 LAB — INFLUENZA PANEL BY PCR (TYPE A & B): Influenza A By PCR: NEGATIVE

## 2013-01-24 LAB — GLUCOSE, CAPILLARY: Glucose-Capillary: 110 mg/dL — ABNORMAL HIGH (ref 70–99)

## 2013-01-24 MED ORDER — SODIUM CHLORIDE 0.9 % IV BOLUS (SEPSIS)
1000.0000 mL | Freq: Once | INTRAVENOUS | Status: AC
  Administered 2013-01-24: 1000 mL via INTRAVENOUS

## 2013-01-24 MED ORDER — ENOXAPARIN SODIUM 40 MG/0.4ML ~~LOC~~ SOLN
40.0000 mg | SUBCUTANEOUS | Status: DC
  Administered 2013-01-24 – 2013-01-25 (×2): 40 mg via SUBCUTANEOUS
  Filled 2013-01-24 (×2): qty 0.4

## 2013-01-24 MED ORDER — SODIUM CHLORIDE 0.9 % IV SOLN
INTRAVENOUS | Status: AC
  Administered 2013-01-24: 17:00:00 via INTRAVENOUS

## 2013-01-24 MED ORDER — ALBUTEROL SULFATE (2.5 MG/3ML) 0.083% IN NEBU
5.0000 mg | INHALATION_SOLUTION | Freq: Once | RESPIRATORY_TRACT | Status: AC
  Administered 2013-01-24: 5 mg via RESPIRATORY_TRACT
  Filled 2013-01-24: qty 6

## 2013-01-24 MED ORDER — DEXTROSE 5 % IV SOLN
1.0000 g | INTRAVENOUS | Status: DC
  Administered 2013-01-25 – 2013-01-26 (×2): 1 g via INTRAVENOUS
  Filled 2013-01-24 (×5): qty 10

## 2013-01-24 MED ORDER — SIMVASTATIN 20 MG PO TABS
40.0000 mg | ORAL_TABLET | Freq: Every evening | ORAL | Status: DC
  Administered 2013-01-24 – 2013-01-25 (×2): 40 mg via ORAL
  Filled 2013-01-24 (×2): qty 2

## 2013-01-24 MED ORDER — ACETAMINOPHEN 325 MG PO TABS
650.0000 mg | ORAL_TABLET | Freq: Four times a day (QID) | ORAL | Status: DC | PRN
  Administered 2013-01-24 – 2013-01-25 (×5): 650 mg via ORAL
  Filled 2013-01-24 (×5): qty 2

## 2013-01-24 MED ORDER — OMEGA-3-ACID ETHYL ESTERS 1 G PO CAPS
1.0000 g | ORAL_CAPSULE | Freq: Two times a day (BID) | ORAL | Status: DC
  Administered 2013-01-24 – 2013-01-26 (×5): 1 g via ORAL
  Filled 2013-01-24 (×5): qty 1

## 2013-01-24 MED ORDER — DEXTROSE 5 % IV SOLN
500.0000 mg | Freq: Once | INTRAVENOUS | Status: AC
  Administered 2013-01-24: 500 mg via INTRAVENOUS

## 2013-01-24 MED ORDER — OSELTAMIVIR PHOSPHATE 75 MG PO CAPS
75.0000 mg | ORAL_CAPSULE | Freq: Two times a day (BID) | ORAL | Status: DC
  Administered 2013-01-24 – 2013-01-25 (×3): 75 mg via ORAL
  Filled 2013-01-24 (×3): qty 1

## 2013-01-24 MED ORDER — ALBUTEROL SULFATE (2.5 MG/3ML) 0.083% IN NEBU
2.5000 mg | INHALATION_SOLUTION | RESPIRATORY_TRACT | Status: DC
  Administered 2013-01-24 – 2013-01-26 (×11): 2.5 mg via RESPIRATORY_TRACT
  Filled 2013-01-24 (×11): qty 3

## 2013-01-24 MED ORDER — IOHEXOL 350 MG/ML SOLN
100.0000 mL | Freq: Once | INTRAVENOUS | Status: AC | PRN
  Administered 2013-01-24: 100 mL via INTRAVENOUS

## 2013-01-24 MED ORDER — DIVALPROEX SODIUM ER 500 MG PO TB24
1000.0000 mg | ORAL_TABLET | Freq: Every day | ORAL | Status: DC
  Administered 2013-01-24 – 2013-01-26 (×3): 1000 mg via ORAL
  Filled 2013-01-24 (×4): qty 2

## 2013-01-24 MED ORDER — NAPHAZOLINE HCL 0.1 % OP SOLN
1.0000 [drp] | Freq: Four times a day (QID) | OPHTHALMIC | Status: DC | PRN
  Filled 2013-01-24: qty 15

## 2013-01-24 MED ORDER — SODIUM CHLORIDE 0.9 % IV SOLN
INTRAVENOUS | Status: DC
  Administered 2013-01-24 – 2013-01-25 (×3): via INTRAVENOUS

## 2013-01-24 MED ORDER — ASPIRIN EC 81 MG PO TBEC
81.0000 mg | DELAYED_RELEASE_TABLET | Freq: Every day | ORAL | Status: DC
  Administered 2013-01-24 – 2013-01-26 (×3): 81 mg via ORAL
  Filled 2013-01-24 (×4): qty 1

## 2013-01-24 MED ORDER — NIACIN ER 500 MG PO CPCR
500.0000 mg | ORAL_CAPSULE | Freq: Every day | ORAL | Status: DC
  Administered 2013-01-24 – 2013-01-25 (×2): 500 mg via ORAL
  Filled 2013-01-24 (×5): qty 1

## 2013-01-24 MED ORDER — DEXTROSE 5 % IV SOLN
1.0000 g | Freq: Once | INTRAVENOUS | Status: AC
  Administered 2013-01-24: 1 g via INTRAVENOUS
  Filled 2013-01-24: qty 10

## 2013-01-24 MED ORDER — AZITHROMYCIN 250 MG PO TABS
500.0000 mg | ORAL_TABLET | ORAL | Status: DC
  Administered 2013-01-24 – 2013-01-25 (×2): 500 mg via ORAL
  Filled 2013-01-24 (×2): qty 2
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
## 2013-01-24 NOTE — Progress Notes (Signed)
ANTIBIOTIC CONSULT NOTE - INITIAL  Pharmacy Consult for Renal Adjustment for meds Indication: Rocephin and Zithromax  No Known Allergies  Patient Measurements: Height: 5\' 11"  (180.3 cm) Weight: 204 lb (92.534 kg) IBW/kg (Calculated) : 75.3  Vital Signs: Temp: 99.1 F (37.3 C) (12/31 1139) Temp src: Oral (12/31 1055) BP: 129/75 mmHg (12/31 1400) Pulse Rate: 95 (12/31 1400) Intake/Output from previous day:   Intake/Output from this shift:    Labs:  Recent Labs  01/24/13 1056  WBC 22.6*  HGB 15.3  PLT 240  CREATININE 1.49*   Estimated Creatinine Clearance: 60.5 ml/min (by C-G formula based on Cr of 1.49). No results found for this basename: VANCOTROUGH, VANCOPEAK, VANCORANDOM, GENTTROUGH, GENTPEAK, GENTRANDOM, TOBRATROUGH, TOBRAPEAK, TOBRARND, AMIKACINPEAK, AMIKACINTROU, AMIKACIN,  in the last 72 hours   Microbiology: No results found for this or any previous visit (from the past 720 hour(s)).  Medical History: Past Medical History  Diagnosis Date  . High cholesterol   . Iron deficiency anemia 12/09/2011  . Depression   . Arthritis   . Tinnitus   . Migraine     Medications:  Scheduled:  . albuterol  2.5 mg Nebulization Q4H  . aspirin EC  81 mg Oral Daily  . [START ON 01/25/2013] azithromycin  500 mg Oral Q24H  . divalproex  1,000 mg Oral Daily  . enoxaparin (LOVENOX) injection  40 mg Subcutaneous Q24H  . fish oil-omega-3 fatty acids  1 g Oral BID  . niacin  500 mg Oral QHS  . oseltamivir  75 mg Oral BID  . simvastatin  40 mg Oral QPM   Assessment: 62yo male with good renal fxn.  Estimated Creatinine Clearance: 61.3 ml/min (by C-G formula based on Cr of 1.49). Rocephin 1gm IV q24h, Zithromax 500mg  IV x 1 then 500mg  PO daily.  Zithromax 12/31 >> Rocephin 12/31 >>  Goal of Therapy:  Eradicate infection.  Plan:  No renal adjustment necessary. Continue current Rx  Valrie Hart A 01/24/2013,3:11 PM
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
## 2013-01-24 NOTE — ED Notes (Signed)
Wife states dizziness and confusion. Pt states dizziness was present this morning when he woke up. Pt was last appeared normal last night.

## 2013-01-25 DIAGNOSIS — N183 Chronic kidney disease, stage 3 unspecified: Secondary | ICD-10-CM

## 2013-01-25 DIAGNOSIS — A419 Sepsis, unspecified organism: Secondary | ICD-10-CM

## 2013-01-25 LAB — CBC
HCT: 39.5 % (ref 39.0–52.0)
Hemoglobin: 13.4 g/dL (ref 13.0–17.0)
MCH: 29 pg (ref 26.0–34.0)
MCHC: 33.9 g/dL (ref 30.0–36.0)
MCV: 85.5 fL (ref 78.0–100.0)
Platelets: 205 10*3/uL (ref 150–400)
RBC: 4.62 MIL/uL (ref 4.22–5.81)
RDW: 14.3 % (ref 11.5–15.5)
WBC: 23.9 10*3/uL — ABNORMAL HIGH (ref 4.0–10.5)

## 2013-01-25 LAB — BASIC METABOLIC PANEL
BUN: 13 mg/dL (ref 6–23)
CO2: 23 mEq/L (ref 19–32)
Calcium: 9 mg/dL (ref 8.4–10.5)
Chloride: 103 mEq/L (ref 96–112)
Creatinine, Ser: 1.45 mg/dL — ABNORMAL HIGH (ref 0.50–1.35)
GFR calc Af Amer: 59 mL/min — ABNORMAL LOW (ref >90)
GFR calc non Af Amer: 51 mL/min — ABNORMAL LOW (ref >90)
Glucose, Bld: 98 mg/dL (ref 70–99)
Potassium: 3.6 mEq/L — ABNORMAL LOW (ref 3.7–5.3)
Sodium: 140 mEq/L (ref 137–147)

## 2013-01-25 LAB — HIV ANTIBODY (ROUTINE TESTING W REFLEX): HIV: NONREACTIVE

## 2013-01-25 MED ORDER — ALUM & MAG HYDROXIDE-SIMETH 200-200-20 MG/5ML PO SUSP
15.0000 mL | ORAL | Status: DC | PRN
  Administered 2013-01-25: 15 mL via ORAL
  Filled 2013-01-25: qty 30

## 2013-01-25 MED ORDER — DICLOFENAC SODIUM 50 MG PO TBEC
50.0000 mg | DELAYED_RELEASE_TABLET | Freq: Once | ORAL | Status: AC
  Administered 2013-01-25: 50 mg via ORAL
  Filled 2013-01-25: qty 1

## 2013-01-25 MED ORDER — POTASSIUM CHLORIDE CRYS ER 20 MEQ PO TBCR
40.0000 meq | EXTENDED_RELEASE_TABLET | Freq: Two times a day (BID) | ORAL | Status: AC
  Administered 2013-01-25 – 2013-01-26 (×3): 40 meq via ORAL
  Filled 2013-01-25 (×3): qty 2

## 2013-01-25 MED ORDER — TOPIRAMATE 100 MG PO TABS
100.0000 mg | ORAL_TABLET | Freq: Every day | ORAL | Status: DC
  Administered 2013-01-25: 100 mg via ORAL
  Filled 2013-01-25 (×4): qty 1

## 2013-01-25 MED ORDER — GABAPENTIN 100 MG PO CAPS: 100.0000 mg | ORAL_CAPSULE | Freq: Three times a day (TID) | ORAL | Status: DC | PRN

## 2013-01-25 NOTE — Progress Notes (Signed)
Utilization Review Complete  
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
## 2013-01-25 NOTE — Evaluation (Signed)
Physical Therapy Evaluation Patient Details Name: Corey [name] MRN: [id number] DOB: 1950-07-09 Today's Date: 01/25/2013 Time: 1010-1020 PT Time Calculation (min): 10 min  PT Assessment / Plan / Recommendation History of Present Illness  Pt admitted to [facility] for Cap with confusion yesterday.   Clinical Impression  Pt referred to PT for mobility.  At this time pt is independent with all mobility, he realizes he was very confused yesterday and today he is at his PLOF.  Discussed with pt and wife to f/u with his orthopedic MD to discuss OP PT for his lumbar radiculopathy.  At this time will sign off on PT.     PT Assessment  Patent does not need any further PT services    Follow Up Recommendations  No PT follow up    Does the patient have the potential to tolerate intense rehabilitation      Barriers to Discharge        Equipment Recommendations       Recommendations for Other Services     Frequency      Precautions / Restrictions     Pertinent Vitals/Pain No pain      Mobility  Bed Mobility Bed Mobility: Supine to Sit Supine to Sit: 7: Independent Transfers Transfers: Sit to Stand;Stand to Sit Sit to Stand: 7: Independent Stand to Sit: 7: Independent Ambulation/Gait Ambulation/Gait Assistance: 7: Independent Ambulation Distance (Feet): 1000 Feet Gait Pattern: Within Functional Limits    Exercises     PT Diagnosis:    PT Problem List:   PT Treatment Interventions:       PT Goals(Current goals can be found in the care plan section)    Visit Information  Last PT Received On: 01/25/13 History of Present Illness: Pt admitted to [facility] for        Prior [place] expects to be discharged to:: Private residence Living Arrangements: Spouse/significant other Type of Home: House Home Access: Stairs to enter Technical brewer of Steps: 4 Entrance Stairs-Rails: None Home Layout: One level Home Equipment: None Prior  Function Level of Independence: Independent Comments: Retried working for [employer]. Communication Communication: No difficulties Dominant Hand: Left    Cognition  Cognition Arousal/Alertness: Awake/alert Behavior During Therapy: WFL for tasks assessed/performed Overall Cognitive Status: Within Functional Limits for tasks assessed    Extremity/Trunk Assessment     Balance    End of Session PT - End of Session Equipment Utilized During Treatment: Gait belt Activity Tolerance: Patient tolerated treatment well Patient left: with family/visitor present (bathroom) Nurse Communication: Mobility status  GP     Corey [name], MPT, ATC 01/25/2013, 10:26 AM

## 2013-01-26 LAB — STREP PNEUMONIAE URINARY ANTIGEN: Strep Pneumo Urinary Antigen: NEGATIVE

## 2013-01-26 LAB — CBC
HEMATOCRIT: 43.2 % (ref 39.0–52.0)
HEMOGLOBIN: 14.3 g/dL (ref 13.0–17.0)
MCH: 28.5 pg (ref 26.0–34.0)
MCHC: 33.1 g/dL (ref 30.0–36.0)
MCV: 86.2 fL (ref 78.0–100.0)
Platelets: 242 10*3/uL (ref 150–400)
RBC: 5.01 MIL/uL (ref 4.22–5.81)
RDW: 14.6 % (ref 11.5–15.5)
WBC: 25.8 10*3/uL — ABNORMAL HIGH (ref 4.0–10.5)

## 2013-01-26 LAB — BASIC METABOLIC PANEL
BUN: 9 mg/dL (ref 6–23)
CALCIUM: 9.3 mg/dL (ref 8.4–10.5)
CO2: 22 mEq/L (ref 19–32)
Chloride: 108 mEq/L (ref 96–112)
Creatinine, Ser: 1.24 mg/dL (ref 0.50–1.35)
GFR calc Af Amer: 71 mL/min — ABNORMAL LOW (ref >90)
GFR, EST NON AFRICAN AMERICAN: 61 mL/min — AB (ref >90)
Glucose, Bld: 92 mg/dL (ref 70–99)
POTASSIUM: 4.2 meq/L (ref 3.7–5.3)
Sodium: 144 mEq/L (ref 137–147)

## 2013-01-26 MED ORDER — CEFUROXIME AXETIL 250 MG PO TABS: 500.0000 mg | ORAL_TABLET | Freq: Two times a day (BID) | ORAL | Status: DC

## 2013-01-26 MED ORDER — CEFUROXIME AXETIL 500 MG PO TABS
500.0000 mg | ORAL_TABLET | Freq: Two times a day (BID) | ORAL | Status: DC
Start: 2013-01-27 — End: 2013-02-28

## 2013-01-26 MED ORDER — AZITHROMYCIN 250 MG PO TABS: 250.0000 mg | ORAL_TABLET | Freq: Every day | ORAL | Status: DC

## 2013-01-26 NOTE — Progress Notes (Signed)
Patient discharged home.  IV removed - WNL.  Instructed on new medications and how to take them..  Instructed on importance of completing whole dose of antibiotics.  Educated on importance of hand washing and hygiene to prevent further infection.  Patient has no questions at this time and is stable.  Left floor ambulating with assist of NT.
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
## 2013-01-27 LAB — LEGIONELLA ANTIGEN, URINE: Legionella Antigen, Urine: NEGATIVE

## 2013-01-29 LAB — CULTURE, BLOOD (ROUTINE X 2)
Culture: NO GROWTH
Culture: NO GROWTH

## 2013-01-31 DIAGNOSIS — M47817 Spondylosis without myelopathy or radiculopathy, lumbosacral region: Secondary | ICD-10-CM | POA: Diagnosis not present

## 2013-01-31 DIAGNOSIS — M5106 Intervertebral disc disorders with myelopathy, lumbar region: Secondary | ICD-10-CM | POA: Diagnosis not present

## 2013-02-12 ENCOUNTER — Encounter (INDEPENDENT_AMBULATORY_CARE_PROVIDER_SITE_OTHER): Payer: Self-pay | Admitting: *Deleted

## 2013-02-13 ENCOUNTER — Ambulatory Visit (HOSPITAL_COMMUNITY)
Admission: RE | Admit: 2013-02-13 | Discharge: 2013-02-13 | Disposition: A | Discharge status: Home / Self Care | Payer: PRIVATE HEALTH INSURANCE | Source: Ambulatory Visit | Attending: Family Medicine | Admitting: Family Medicine

## 2013-02-13 ENCOUNTER — Other Ambulatory Visit (HOSPITAL_COMMUNITY): Payer: Self-pay | Admitting: Family Medicine

## 2013-02-13 DIAGNOSIS — I771 Stricture of artery: Secondary | ICD-10-CM | POA: Insufficient documentation

## 2013-02-13 DIAGNOSIS — Z8701 Personal history of pneumonia (recurrent): Secondary | ICD-10-CM

## 2013-02-13 DIAGNOSIS — R05 Cough: Secondary | ICD-10-CM

## 2013-02-13 DIAGNOSIS — R059 Cough, unspecified: Secondary | ICD-10-CM

## 2013-02-21 DIAGNOSIS — IMO0002 Reserved for concepts with insufficient information to code with codable children: Secondary | ICD-10-CM | POA: Diagnosis not present

## 2013-02-21 DIAGNOSIS — M5106 Intervertebral disc disorders with myelopathy, lumbar region: Secondary | ICD-10-CM | POA: Diagnosis not present

## 2013-02-28 ENCOUNTER — Encounter (INDEPENDENT_AMBULATORY_CARE_PROVIDER_SITE_OTHER): Payer: Self-pay | Admitting: Internal Medicine

## 2013-02-28 ENCOUNTER — Ambulatory Visit (INDEPENDENT_AMBULATORY_CARE_PROVIDER_SITE_OTHER): Payer: PRIVATE HEALTH INSURANCE | Admitting: Internal Medicine

## 2013-02-28 VITALS — BP 102/70 | HR 64 | Temp 97.0°F | Ht 71.0 in | Wt 193.9 lb

## 2013-02-28 DIAGNOSIS — K2270 Barrett's esophagus without dysplasia: Secondary | ICD-10-CM | POA: Diagnosis not present

## 2013-02-28 DIAGNOSIS — K219 Gastro-esophageal reflux disease without esophagitis: Secondary | ICD-10-CM

## 2013-02-28 MED ORDER — PANTOPRAZOLE SODIUM 40 MG PO TBEC: 40.0000 mg | DELAYED_RELEASE_TABLET | Freq: Every day | ORAL | Status: DC

## 2013-02-28 NOTE — Progress Notes (Signed)
Subjective:     Patient ID: Corey [name], male   DOB: 1950-10-24, 63 y.o.   MRN: [id number]  HPI Here today for f/u. He tells me he has ringing in his ears all the time.  He denies any acid reflux.No dysphagia. His appetite is good. He has had weight loss, which was intentional. He has not been taking a PPI for his Barrett's. He has BM daily. No melena or bright red rectal bleeding. He has been exercising and watching his diet.  01/27/2012 EGD: Impression:  3 cm long segment of Barrett's esophagus proximal margin and 34 cm from the incisors.  No evidence of erosive esophagitis.  Small sliding hiatal hernia with GEJ at 37 cm.  Suspect iron deficiency anemia may be secondary to blood donation. Biopsy:  Notes Recorded by Rogene Houston, MD on 02/07/2012 at 2:58 PM Biopsy results reviewed with patient. Barrett's esophagus without dysplasia.  CBC    Component Value Date/Time   WBC 25.8* 01/26/2013 0605   RBC 5.01 01/26/2013 0605   HGB 14.3 01/26/2013 0605   HCT 43.2 01/26/2013 0605   PLT 242 01/26/2013 0605   MCV 86.2 01/26/2013 0605   MCH 28.5 01/26/2013 0605   MCHC 33.1 01/26/2013 0605   RDW 14.6 01/26/2013 0605   LYMPHSABS 1.4 01/24/2013 1056   MONOABS 1.5* 01/24/2013 1056   EOSABS 0.1 01/24/2013 1056   BASOSABS 0.0 01/24/2013 1056      1211/2011 Colonoscopy: Average risk screening. Dr. Laural Golden: Normal colonoscopy  07/20/11 H and H 13.8 and 42.0, MCV 76.2  12/6/012 H and H 13.5 and 40.6, MCV 88.1  Review of Systems Current Outpatient Prescriptions  Medication Sig Dispense Refill  . ALPRAZolam (XANAX) 1 MG tablet Take 1 mg by mouth 2 (two) times daily as needed. For anxiety      . aspirin EC 81 MG tablet Take 81 mg by mouth daily.      Marland Kitchen buPROPion (WELLBUTRIN SR) 150 MG 12 hr tablet Take 450 mg by mouth daily.       . divalproex (DEPAKOTE ER) 500 MG 24 hr tablet Take 1,000 mg by mouth daily.      . fish oil-omega-3 fatty acids 1000 MG capsule Take 1 g by mouth 2 (two) times daily.      .  fluticasone (VERAMYST) 27.5 MCG/SPRAY nasal spray Place 2 sprays into the nose daily.      . Multiple Vitamins-Minerals (CENTRUM SILVER PO) Take 1 tablet by mouth daily.      . niacin 500 MG tablet Take 500 mg by mouth at bedtime.      . simvastatin (ZOCOR) 40 MG tablet Take 40 mg by mouth every evening.      . Testosterone (AXIRON) 30 MG/ACT SOLN Place onto the skin.      Marland Kitchen topiramate (TOPAMAX) 100 MG tablet Take 1 tablet (100 mg total) by mouth at bedtime.  30 tablet  3  . naphazoline-glycerin (CLEAR EYES) 0.012-0.2 % SOLN Place 2 drops into both eyes daily as needed for irritation.      . pantoprazole (PROTONIX) 40 MG tablet Take 1 tablet (40 mg total) by mouth daily.  90 tablet  3   No current facility-administered medications for this visit.   Past Medical History  Diagnosis Date  . High cholesterol   . Iron deficiency anemia 12/09/2011  . Depression   . Arthritis   . Tinnitus   . Migraine        Objective:   Physical  Exam  Filed Vitals:   02/28/13 1041  BP: 102/70  Pulse: 64  Temp: 97 F (36.1 C)  Height: 5\' 11"  (1.803 m)  Weight: 193 lb 14.4 oz (87.952 kg)       Alert and oriented. Skin warm and dry. Oral mucosa is moist.   . Sclera anicteric, conjunctivae is pink. Thyroid not enlarged. No cervical lymphadenopathy. Lungs clear. Heart regular rate and rhythm.  Abdomen is soft. Bowel sounds are positive. No hepatomegaly. No abdominal masses felt. No tenderness.  No edema to lower extremities   Assessment:    Barrett's esophagus. Has been off PPI x 1 yr. Denies having acid reflux.     Plan:    OV in 1 yr. Rx for Protonix 40mg  daily.

## 2013-02-28 NOTE — Patient Instructions (Signed)
OV in 1 yr. Rx for Protonix 40mg  daily.

## 2013-03-01 DIAGNOSIS — G43719 Chronic migraine without aura, intractable, without status migrainosus: Secondary | ICD-10-CM | POA: Diagnosis not present

## 2013-03-14 ENCOUNTER — Other Ambulatory Visit: Payer: Self-pay | Admitting: Neurology

## 2013-03-16 DIAGNOSIS — F331 Major depressive disorder, recurrent, moderate: Secondary | ICD-10-CM | POA: Diagnosis not present

## 2013-03-19 DIAGNOSIS — M549 Dorsalgia, unspecified: Secondary | ICD-10-CM | POA: Diagnosis not present

## 2013-03-19 DIAGNOSIS — E663 Overweight: Secondary | ICD-10-CM | POA: Diagnosis not present

## 2013-03-26 ENCOUNTER — Emergency Department (HOSPITAL_COMMUNITY)
Admission: EM | Admit: 2013-03-26 | Discharge: 2013-03-27 | Disposition: A | Discharge status: Home / Self Care | Payer: No Typology Code available for payment source | Attending: Emergency Medicine | Admitting: Emergency Medicine

## 2013-03-26 ENCOUNTER — Encounter (HOSPITAL_COMMUNITY): Payer: Self-pay | Admitting: Emergency Medicine

## 2013-03-26 DIAGNOSIS — Z79899 Other long term (current) drug therapy: Secondary | ICD-10-CM | POA: Insufficient documentation

## 2013-03-26 DIAGNOSIS — IMO0002 Reserved for concepts with insufficient information to code with codable children: Secondary | ICD-10-CM | POA: Diagnosis not present

## 2013-03-26 DIAGNOSIS — R443 Hallucinations, unspecified: Secondary | ICD-10-CM | POA: Diagnosis not present

## 2013-03-26 DIAGNOSIS — F329 Major depressive disorder, single episode, unspecified: Secondary | ICD-10-CM | POA: Insufficient documentation

## 2013-03-26 DIAGNOSIS — Z791 Long term (current) use of non-steroidal anti-inflammatories (NSAID): Secondary | ICD-10-CM | POA: Diagnosis not present

## 2013-03-26 DIAGNOSIS — R4586 Emotional lability: Secondary | ICD-10-CM

## 2013-03-26 DIAGNOSIS — Z7982 Long term (current) use of aspirin: Secondary | ICD-10-CM | POA: Diagnosis not present

## 2013-03-26 DIAGNOSIS — F3289 Other specified depressive episodes: Secondary | ICD-10-CM | POA: Diagnosis not present

## 2013-03-26 DIAGNOSIS — Z8669 Personal history of other diseases of the nervous system and sense organs: Secondary | ICD-10-CM | POA: Insufficient documentation

## 2013-03-26 DIAGNOSIS — Z87891 Personal history of nicotine dependence: Secondary | ICD-10-CM | POA: Diagnosis not present

## 2013-03-26 DIAGNOSIS — E7800 Pure hypercholesterolemia, unspecified: Secondary | ICD-10-CM | POA: Diagnosis not present

## 2013-03-26 DIAGNOSIS — Z862 Personal history of diseases of the blood and blood-forming organs and certain disorders involving the immune mechanism: Secondary | ICD-10-CM | POA: Diagnosis not present

## 2013-03-26 DIAGNOSIS — M129 Arthropathy, unspecified: Secondary | ICD-10-CM | POA: Insufficient documentation

## 2013-03-26 DIAGNOSIS — G43909 Migraine, unspecified, not intractable, without status migrainosus: Secondary | ICD-10-CM | POA: Diagnosis not present

## 2013-03-26 DIAGNOSIS — F22 Delusional disorders: Secondary | ICD-10-CM | POA: Insufficient documentation

## 2013-03-26 DIAGNOSIS — Z008 Encounter for other general examination: Secondary | ICD-10-CM | POA: Diagnosis present

## 2013-03-26 DIAGNOSIS — F39 Unspecified mood [affective] disorder: Secondary | ICD-10-CM | POA: Diagnosis not present

## 2013-03-26 DIAGNOSIS — F19959 Other psychoactive substance use, unspecified with psychoactive substance-induced psychotic disorder, unspecified: Secondary | ICD-10-CM | POA: Diagnosis present

## 2013-03-26 LAB — CBC WITH DIFFERENTIAL/PLATELET
Basophils Absolute: 0.1 10*3/uL (ref 0.0–0.1)
Basophils Relative: 1 % (ref 0–1)
EOS ABS: 0.3 10*3/uL (ref 0.0–0.7)
EOS PCT: 3 % (ref 0–5)
HCT: 47.9 % (ref 39.0–52.0)
HEMOGLOBIN: 16.4 g/dL (ref 13.0–17.0)
LYMPHS ABS: 4.1 10*3/uL — AB (ref 0.7–4.0)
Lymphocytes Relative: 41 % (ref 12–46)
MCH: 29 pg (ref 26.0–34.0)
MCHC: 34.2 g/dL (ref 30.0–36.0)
MCV: 84.6 fL (ref 78.0–100.0)
MONOS PCT: 8 % (ref 3–12)
Monocytes Absolute: 0.7 10*3/uL (ref 0.1–1.0)
NEUTROS PCT: 48 % (ref 43–77)
Neutro Abs: 4.8 10*3/uL (ref 1.7–7.7)
Platelets: 237 10*3/uL (ref 150–400)
RBC: 5.66 MIL/uL (ref 4.22–5.81)
RDW: 14.6 % (ref 11.5–15.5)
WBC: 9.9 10*3/uL (ref 4.0–10.5)

## 2013-03-26 LAB — COMPREHENSIVE METABOLIC PANEL
ALT: 17 U/L (ref 0–53)
AST: 23 U/L (ref 0–37)
Albumin: 3.9 g/dL (ref 3.5–5.2)
Alkaline Phosphatase: 40 U/L (ref 39–117)
BUN: 10 mg/dL (ref 6–23)
CALCIUM: 9.3 mg/dL (ref 8.4–10.5)
CO2: 23 mEq/L (ref 19–32)
CREATININE: 1.3 mg/dL (ref 0.50–1.35)
Chloride: 106 mEq/L (ref 96–112)
GFR calc non Af Amer: 58 mL/min — ABNORMAL LOW (ref >90)
GFR, EST AFRICAN AMERICAN: 67 mL/min — AB (ref >90)
GLUCOSE: 92 mg/dL (ref 70–99)
Potassium: 4.6 mEq/L (ref 3.7–5.3)
Sodium: 141 mEq/L (ref 137–147)
TOTAL PROTEIN: 7.3 g/dL (ref 6.0–8.3)
Total Bilirubin: <0.2 mg/dL — ABNORMAL LOW (ref 0.3–1.2)

## 2013-03-26 LAB — URINALYSIS, ROUTINE W REFLEX MICROSCOPIC
BILIRUBIN URINE: NEGATIVE
GLUCOSE, UA: NEGATIVE mg/dL
HGB URINE DIPSTICK: NEGATIVE
KETONES UR: NEGATIVE mg/dL
Leukocytes, UA: NEGATIVE
Nitrite: NEGATIVE
PH: 6 (ref 5.0–8.0)
PROTEIN: NEGATIVE mg/dL
Specific Gravity, Urine: 1.019 (ref 1.005–1.030)
Urobilinogen, UA: 0.2 mg/dL (ref 0.0–1.0)

## 2013-03-26 LAB — RAPID URINE DRUG SCREEN, HOSP PERFORMED
AMPHETAMINES: NOT DETECTED
BENZODIAZEPINES: NOT DETECTED
Barbiturates: NOT DETECTED
COCAINE: NOT DETECTED
OPIATES: NOT DETECTED
Tetrahydrocannabinol: NOT DETECTED

## 2013-03-26 LAB — ETHANOL

## 2013-03-26 MED ORDER — ACETAMINOPHEN 325 MG PO TABS: 650.0000 mg | ORAL_TABLET | ORAL | Status: DC | PRN

## 2013-03-26 MED ORDER — LORAZEPAM 1 MG PO TABS: 1.0000 mg | ORAL_TABLET | Freq: Three times a day (TID) | ORAL | Status: DC | PRN

## 2013-03-26 MED ORDER — ZOLPIDEM TARTRATE 5 MG PO TABS: 5.0000 mg | ORAL_TABLET | Freq: Every evening | ORAL | Status: DC | PRN

## 2013-03-26 MED ORDER — ONDANSETRON HCL 4 MG PO TABS: 4.0000 mg | ORAL_TABLET | Freq: Three times a day (TID) | ORAL | Status: DC | PRN

## 2013-03-26 MED ORDER — ALUM & MAG HYDROXIDE-SIMETH 200-200-20 MG/5ML PO SUSP: 30.0000 mL | ORAL | Status: DC | PRN

## 2013-03-26 MED ORDER — IBUPROFEN 200 MG PO TABS: 600.0000 mg | ORAL_TABLET | Freq: Three times a day (TID) | ORAL | Status: DC | PRN

## 2013-03-26 NOTE — ED Provider Notes (Signed)
CSN: [id number]     Arrival date & time 03/26/13  1442 History   First MD Initiated Contact with Patient 03/26/13 1725     Chief Complaint  Patient presents with  . Psychiatric Evaluation     (Consider location/radiation/quality/duration/timing/severity/associated sxs/prior Treatment) HPI  63 year old male with history of depression, chronic migraine, iron deficiency anemia who was sent here by his PCP for psychiatric evaluation. History obtained through patient and through wife who is at bedside. Per wife, patient has had unusual behavior changes with mood swings, slurring of speech and gait, anger issues along with auditory and visual hallucinations ongoing for more than a month. Patient reports he hears sounds, and often see human figures to the side of his peripheral view usually at night. He also feels angry at time and more confrontational. This is not normal for him. Denies any thought of hurting himself. His wife believes his change in behavior since likely secondary to the many medications that he is currently taking, specifically Topamax. States that he has been taking that for his headache in the past it has caused him to be behave strangely, he did stop medication for a period of time which has helped with his mental status, however he is back on the medication and again she noticed a change in his personality. He has history of chronic back pain and does take pain medication and muscle relaxant. His wife believes taking too many medication has affecting his health negatively. At this time patient denies any acute pain, denies any headache, change in his vision, chest and abdominal pain. Still endorsed chronic back pain. Denies depression. Denies self medicating with alcohol or recreational drugs. No prior history of stroke.    Past Medical History  Diagnosis Date  . High cholesterol   . Iron deficiency anemia 12/09/2011  . Depression   . Arthritis   . Tinnitus   . Migraine    Past  Surgical History  Procedure Laterality Date  . Rotator cuff repair Bilateral   . Hand surgery Bilateral   . Esophagogastroduodenoscopy  01/27/2012    Procedure: ESOPHAGOGASTRODUODENOSCOPY (EGD);  Surgeon: Rogene Houston, MD;  Location: [facility]ITE;  Service: Endoscopy;  Laterality: N/A;  350-rescheduled to 9:15 Ann notitied pt  . Hernia repair    . Back surgery    . Mass excision Left 07/14/2012    Procedure: EXCISION SOFT TISSUE MASS LEG;  Surgeon: Jamesetta So, MD;  Location: [facility] ORS;  Service: General;  Laterality: Left;   History reviewed. No pertinent family history. History  Substance Use Topics  . Smoking status: Former Research scientist (life sciences)  . Smokeless tobacco: Not on file     Comment: quit over 3 yrs ago  . Alcohol Use: No    Review of Systems  All other systems reviewed and are negative.      Allergies  Review of patient's allergies indicates no known allergies.  Home Medications   Current Outpatient Rx  Name  Route  Sig  Dispense  Refill  . Ascorbic Acid (VITAMIN C) 500 MG CHEW   Oral   Chew 1 tablet by mouth. One to three times per day.         Marland Kitchen aspirin EC 81 MG tablet   Oral   Take 81 mg by mouth daily.         Marland Kitchen buPROPion (WELLBUTRIN XL) 150 MG 24 hr tablet   Oral   Take 450 mg by mouth every morning.         Marland Kitchen  clonazePAM (KLONOPIN) 0.5 MG tablet      Take 1 tab (0.5mg ) in the morning and take 2 tabs (1mg ) at bedtime.         . cyclobenzaprine (FLEXERIL) 10 MG tablet   Oral   Take 10 mg by mouth 3 (three) times daily as needed for muscle spasms.         . divalproex (DEPAKOTE ER) 500 MG 24 hr tablet   Oral   Take 1,000 mg by mouth daily.         . fluticasone (VERAMYST) 27.5 MCG/SPRAY nasal spray   Nasal   Place 2 sprays into the nose daily.         . meloxicam (MOBIC) 7.5 MG tablet   Oral   Take 7.5 mg by mouth 2 (two) times daily.         . Multiple Vitamin (MULTIVITAMIN WITH MINERALS) TABS tablet   Oral   Take 1 tablet by mouth  daily. Centrum Silver         . naphazoline-glycerin (CLEAR EYES) 0.012-0.2 % SOLN   Both Eyes   Place 2 drops into both eyes daily as needed for irritation.         . niacin 500 MG tablet   Oral   Take 1,000 mg by mouth every morning.          . pantoprazole (PROTONIX) 40 MG tablet   Oral   Take 1 tablet (40 mg total) by mouth daily.   90 tablet   3     30 minutes before breakfast.   . simvastatin (ZOCOR) 40 MG tablet   Oral   Take 40 mg by mouth daily.          . Testosterone (AXIRON) 30 MG/ACT SOLN   Topical   Apply 1 application topically daily.          Marland Kitchen topiramate (TOPAMAX) 100 MG tablet   Oral   Take 100 mg by mouth at bedtime.          BP 115/91  Pulse 85  Temp(Src) 97.9 F (36.6 C) (Oral)  Resp 16  SpO2 98% Physical Exam  Constitutional: He is oriented to person, place, and time. He appears well-developed and well-nourished. No distress.  HENT:  Head: Atraumatic.  Eyes: Conjunctivae are normal.  Neck: Normal range of motion. Neck supple.  Cardiovascular: Normal rate and regular rhythm.   Pulmonary/Chest: Effort normal and breath sounds normal.  Musculoskeletal: He exhibits no edema.  Neurological: He is alert and oriented to person, place, and time. He has normal strength. GCS eye subscore is 4. GCS verbal subscore is 5. GCS motor subscore is 6.  Skin: No rash noted.  Psychiatric: He has a normal mood and affect. His speech is normal. He is slowed. Thought content is delusional. Thought content is not paranoid. He expresses no homicidal and no suicidal ideation.    ED Course  Procedures (including critical care time)  6:07 PM The patient was sent here by PCP for psychiatric evaluation. The symptom is been ongoing for months, doubt delirium secondary to infection. No active SI or HI. We'll perform medical clearance and will consult TTS for further management.  6:38 PM Pt is medically cleared, will consult TTS for further psychiatric  evaluation especially with trouble with hallucination.    Labs Review Labs Reviewed  CBC WITH DIFFERENTIAL - Abnormal; Notable for the following:    Lymphs Abs 4.1 (*)    All other components within  normal limits  COMPREHENSIVE METABOLIC PANEL - Abnormal; Notable for the following:    Total Bilirubin <0.2 (*)    GFR calc non Af Amer 58 (*)    GFR calc Af Amer 67 (*)    All other components within normal limits  ETHANOL  URINE RAPID DRUG SCREEN (HOSP PERFORMED)  URINALYSIS, ROUTINE W REFLEX MICROSCOPIC   Imaging Review No results found.   EKG Interpretation None      MDM   Final diagnoses:  Mood swing  Hallucination    BP 133/92  Pulse 74  Temp(Src) 98.3 F (36.8 C) (Oral)  Resp 20  SpO2 98%  I have reviewed nursing notes and vital signs. I reviewed available ER/hospitalization records thought the EMR     Domenic Moras, [place] 03/27/13 0017

## 2013-03-26 NOTE — ED Notes (Signed)
Pt was sent here by dr Alyse Low for psychiatric evaluation. Per wife pt has been displaying unusual behavior with increased agitation, anger issues and auditory and visual hallucinations. Pt denies SI/HI. However his wife sts last night when he talk to his sister pt stated that "he could hurt a lot of people".
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
## 2013-03-26 NOTE — ED Notes (Signed)
Patient is resting comfortably. 

## 2013-03-26 NOTE — ED Notes (Signed)
Counselor notes that pt has been unsteady on his feet at home. Will remain on acute ED side at this time. Pt ambulatory to bathroom with no problems. Family and pt updated. Wife leaving at this time. Pt alert and oriented. In no acute distress at this time.

## 2013-03-26 NOTE — ED Notes (Signed)
Provided Pt with sandwich and drink  

## 2013-03-27 ENCOUNTER — Encounter (HOSPITAL_COMMUNITY): Payer: Self-pay | Admitting: Registered Nurse

## 2013-03-27 DIAGNOSIS — F1999 Other psychoactive substance use, unspecified with unspecified psychoactive substance-induced disorder: Secondary | ICD-10-CM

## 2013-03-27 DIAGNOSIS — F3289 Other specified depressive episodes: Secondary | ICD-10-CM

## 2013-03-27 DIAGNOSIS — F411 Generalized anxiety disorder: Secondary | ICD-10-CM

## 2013-03-27 DIAGNOSIS — F329 Major depressive disorder, single episode, unspecified: Secondary | ICD-10-CM

## 2013-03-27 DIAGNOSIS — F19959 Other psychoactive substance use, unspecified with psychoactive substance-induced psychotic disorder, unspecified: Secondary | ICD-10-CM | POA: Diagnosis present

## 2013-03-27 DIAGNOSIS — R443 Hallucinations, unspecified: Secondary | ICD-10-CM | POA: Diagnosis not present

## 2013-03-27 MED ORDER — FLUTICASONE PROPIONATE 50 MCG/ACT NA SUSP
2.0000 | Freq: Every day | NASAL | Status: DC
  Filled 2013-03-27: qty 16

## 2013-03-27 MED ORDER — PANTOPRAZOLE SODIUM 40 MG PO TBEC
40.0000 mg | DELAYED_RELEASE_TABLET | Freq: Every day | ORAL | Status: DC
  Administered 2013-03-27: 40 mg via ORAL

## 2013-03-27 MED ORDER — CYCLOBENZAPRINE HCL 10 MG PO TABS: 10.0000 mg | ORAL_TABLET | Freq: Three times a day (TID) | ORAL | Status: DC | PRN

## 2013-03-27 MED ORDER — SIMVASTATIN 40 MG PO TABS
40.0000 mg | ORAL_TABLET | Freq: Every day | ORAL | Status: DC
  Filled 2013-03-27: qty 1

## 2013-03-27 MED ORDER — DIVALPROEX SODIUM ER 500 MG PO TB24
1000.0000 mg | ORAL_TABLET | Freq: Every day | ORAL | Status: DC
  Filled 2013-03-27: qty 2

## 2013-03-27 MED ORDER — BUPROPION HCL ER (XL) 300 MG PO TB24
450.0000 mg | ORAL_TABLET | Freq: Every day | ORAL | Status: DC
  Administered 2013-03-27: 450 mg via ORAL
  Filled 2013-03-27: qty 1

## 2013-03-27 MED ORDER — FLUTICASONE FUROATE 27.5 MCG/SPRAY NA SUSP: 2.0000 | Freq: Every day | NASAL | Status: DC
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
## 2013-03-27 NOTE — Discharge Instructions (Signed)
Hallucinations and Delusions You seem to be having hallucinations and/or delusions. You may be hearing voices that no one else can hear. This can seem very real to you. You may be having thoughts and fears that do not make sense to others. This condition can be due to mental disease like schizophrenia. It may be caused by a medical condition, such as an infection or electrolyte disturbance. These symptoms are also seen in drug abusers, especially those who use crack cocaine and amphetamines. Drugs like PCP, LSD, MDMA, peyote, and psilocybin can also cause frightening hallucinations and loss of control. If your symptoms are due to drug abuse, your mental state should improve as the drug(s) leave your system. Someone you trust should be with you until you are better to protect you and calm your fears. Often tranquilizers are very helpful at controlling hallucinations, anxiety, and destructive behavior. Getting a proper diet and enough sleep is important to recovery. If your symptoms are not due to drugs, or do not improve over several days after stopping drug use, you need further medical or mental health care. SEEK IMMEDIATE MEDICAL CARE IF:   Your symptoms get worse, especially if you think your life is in danger  You have violent or destructive thoughts. Recovery is possible, but you have to get proper treatment and avoid drugs that are known to cause you trouble. Document Released: 02/19/2004 Document Revised: 04/05/2011 Document Reviewed: 01/11/2005 [facility] Patient Information 2014 [place].  Mood Disorders Mood disorders are conditions that affect the way a person feels emotionally. The main mood disorders include:  Depression.  Bipolar disorder.  Dysthymia. Dysthymia is a mild, lasting (chronic) depression. Symptoms of dysthymia are similar to depression, but not as severe.  Cyclothymia. Cyclothymia includes mood swings, but the highs and lows are not as severe as they are in  bipolar disorder. Symptoms of cyclothymia are similar to those of bipolar disorder, but less extreme. CAUSES  Mood disorders are probably caused by a combination of factors. People with mood disorders seem to have physical and chemical changes in their brains. Mood disorders run in families, so there may be genetic causes. Severe trauma or stressful life events may also increase the risk of mood disorders.  SYMPTOMS  Symptoms of mood disorders depend on the specific type of condition. Depression symptoms include:  Feeling sad, worthless, or hopeless.  Negative thoughts.  Inability to enjoy one's usual activities.  Low energy.  Sleeping too much or too little.  Appetite changes.  Crying.  Concentration problems.  Thoughts of harming oneself. Bipolar disorder symptoms include:  Periods of depression (see above symptoms).  Mood swings, from sadness and depression, to abnormal elation and excitement.  Periods of mania:  Racing thoughts.  Fast speech.  Poor judgment, and careless, dangerous choices.  Decreased need for sleep.  Risky behavior.  Difficulty concentrating.  Irritability.  Increased energy.  Increased sex drive. DIAGNOSIS  There are no blood tests or X-rays that can confirm a mood disorder. However, your caregiver may choose to run some tests to make sure that there is not another physical cause for your symptoms. A mood disorder is usually diagnosed after an in-depth interview with a caregiver. TREATMENT  Mood disorders can be treated with one or more of the following:  Medicine. This may include antidepressants, mood-stabilizers, or anti-psychotics.  Psychotherapy (talk therapy).  Cognitive behavioral therapy. You are taught to recognize negative thoughts and behavior patterns, and replace them with healthy thoughts and behaviors.  Electroconvulsive therapy. For  very severe cases of deep depression, a series of treatments in which an electrical  current is applied to the brain.  Vagus nerve stimulation. A pulse of electricity is applied to a portion of the brain.  Transcranial magnetic stimulation. Powerful magnets are placed on the head that produce electrical currents.  Hospitalization. In severe situations, or when someone is having serious thoughts of harming him or herself, hospitalization may be necessary in order to keep the person safe. This is also done to quickly start and monitor treatment. HOME CARE INSTRUCTIONS   Take your medicine exactly as directed.  Attend all of your therapy sessions.  Try to eat regular, healthy meals.  Exercise daily. Exercise may improve mood symptoms.  Get good sleep.  Do not drink alcohol or use pot or other drugs. These can worsen mood symptoms and cause anxiety and psychosis.  Tell your caregiver if you develop any side effects, such as feeling sick to your stomach (nauseous), dry mouth, dizziness, constipation, drowsiness, tremor, weight gain, or sexual symptoms. He or she may suggest things you can do to improve symptoms.  Learn ways to cope with the stress of having a chronic illness. This includes yoga, meditation, tai chi, or participating in a support group.  Drink enough water to keep your urine clear or pale yellow. Eat a high-fiber diet. These habits may help you avoid constipation from your medicine. SEEK IMMEDIATE MEDICAL CARE IF:  Your mood worsens.  You have thoughts of hurting yourself or others.  You cannot care for yourself.  You develop the sensation of hearing or seeing something that is not actually present (auditory or visual hallucinations).  You develop abnormal thoughts. Document Released: 11/08/2008 Document Revised: 04/05/2011 Document Reviewed: 11/08/2008 [facility] Patient Information 2014 [place], [place].

## 2013-03-27 NOTE — ED Notes (Signed)
Pt resting comfortably. No acute distress. Rise and fall or chest observed.

## 2013-03-27 NOTE — ED Notes (Signed)
Pt and family updated, they are aware pt will be discharged as soon as discharge papers are done. Pt and family verbalized understanding, no needs expressed.

## 2013-03-27 NOTE — ED Notes (Signed)
Bed: WA19 Expected date:  Expected time:  Means of arrival:  Comments: 

## 2013-03-27 NOTE — ED Provider Notes (Signed)
Medical screening examination/treatment/procedure(s) were performed by non-physician practitioner and as supervising physician I was immediately available for consultation/collaboration.   EKG Interpretation None       Virgel Manifold, MD 03/27/13 1459

## 2013-03-28 DIAGNOSIS — F331 Major depressive disorder, recurrent, moderate: Secondary | ICD-10-CM | POA: Diagnosis not present

## 2013-03-28 NOTE — Consult Note (Signed)
Face to face evaluation, and I agree with this note

## 2013-04-02 DIAGNOSIS — F331 Major depressive disorder, recurrent, moderate: Secondary | ICD-10-CM | POA: Diagnosis not present

## 2013-04-05 ENCOUNTER — Emergency Department (HOSPITAL_COMMUNITY)
Admission: EM | Admit: 2013-04-05 | Discharge: 2013-04-06 | Disposition: A | Discharge status: Other Acute Inpatient Hospital | Payer: No Typology Code available for payment source | Attending: Emergency Medicine | Admitting: Emergency Medicine

## 2013-04-05 ENCOUNTER — Encounter (HOSPITAL_COMMUNITY): Payer: Self-pay | Admitting: Emergency Medicine

## 2013-04-05 DIAGNOSIS — F29 Unspecified psychosis not due to a substance or known physiological condition: Secondary | ICD-10-CM | POA: Insufficient documentation

## 2013-04-05 DIAGNOSIS — IMO0002 Reserved for concepts with insufficient information to code with codable children: Secondary | ICD-10-CM | POA: Insufficient documentation

## 2013-04-05 DIAGNOSIS — Z79899 Other long term (current) drug therapy: Secondary | ICD-10-CM | POA: Insufficient documentation

## 2013-04-05 DIAGNOSIS — Z862 Personal history of diseases of the blood and blood-forming organs and certain disorders involving the immune mechanism: Secondary | ICD-10-CM | POA: Insufficient documentation

## 2013-04-05 DIAGNOSIS — F19951 Other psychoactive substance use, unspecified with psychoactive substance-induced psychotic disorder with hallucinations: Secondary | ICD-10-CM | POA: Insufficient documentation

## 2013-04-05 DIAGNOSIS — Z7982 Long term (current) use of aspirin: Secondary | ICD-10-CM | POA: Insufficient documentation

## 2013-04-05 DIAGNOSIS — Z87891 Personal history of nicotine dependence: Secondary | ICD-10-CM | POA: Insufficient documentation

## 2013-04-05 DIAGNOSIS — F312 Bipolar disorder, current episode manic severe with psychotic features: Secondary | ICD-10-CM

## 2013-04-05 DIAGNOSIS — F411 Generalized anxiety disorder: Secondary | ICD-10-CM | POA: Insufficient documentation

## 2013-04-05 DIAGNOSIS — F919 Conduct disorder, unspecified: Secondary | ICD-10-CM | POA: Insufficient documentation

## 2013-04-05 DIAGNOSIS — F19959 Other psychoactive substance use, unspecified with psychoactive substance-induced psychotic disorder, unspecified: Secondary | ICD-10-CM

## 2013-04-05 DIAGNOSIS — M129 Arthropathy, unspecified: Secondary | ICD-10-CM | POA: Insufficient documentation

## 2013-04-05 DIAGNOSIS — E7800 Pure hypercholesterolemia, unspecified: Secondary | ICD-10-CM | POA: Insufficient documentation

## 2013-04-05 DIAGNOSIS — G43909 Migraine, unspecified, not intractable, without status migrainosus: Secondary | ICD-10-CM | POA: Insufficient documentation

## 2013-04-05 LAB — RAPID URINE DRUG SCREEN, HOSP PERFORMED
Amphetamines: NOT DETECTED
BENZODIAZEPINES: NOT DETECTED
Barbiturates: NOT DETECTED
Cocaine: NOT DETECTED
OPIATES: NOT DETECTED
Tetrahydrocannabinol: NOT DETECTED

## 2013-04-05 LAB — URINALYSIS, ROUTINE W REFLEX MICROSCOPIC
Bilirubin Urine: NEGATIVE
Glucose, UA: NEGATIVE mg/dL
HGB URINE DIPSTICK: NEGATIVE
Ketones, ur: NEGATIVE mg/dL
Leukocytes, UA: NEGATIVE
NITRITE: NEGATIVE
PROTEIN: NEGATIVE mg/dL
SPECIFIC GRAVITY, URINE: 1.012 (ref 1.005–1.030)
UROBILINOGEN UA: 0.2 mg/dL (ref 0.0–1.0)
pH: 6.5 (ref 5.0–8.0)

## 2013-04-05 LAB — CBC WITH DIFFERENTIAL/PLATELET
Basophils Absolute: 0.1 10*3/uL (ref 0.0–0.1)
Basophils Relative: 1 % (ref 0–1)
Eosinophils Absolute: 0.2 10*3/uL (ref 0.0–0.7)
Eosinophils Relative: 2 % (ref 0–5)
HCT: 46.8 % (ref 39.0–52.0)
Hemoglobin: 16.1 g/dL (ref 13.0–17.0)
Lymphocytes Relative: 42 % (ref 12–46)
Lymphs Abs: 4.3 10*3/uL — ABNORMAL HIGH (ref 0.7–4.0)
MCH: 28.4 pg (ref 26.0–34.0)
MCHC: 34.4 g/dL (ref 30.0–36.0)
MCV: 82.7 fL (ref 78.0–100.0)
Monocytes Absolute: 0.8 10*3/uL (ref 0.1–1.0)
Monocytes Relative: 8 % (ref 3–12)
Neutro Abs: 4.8 10*3/uL (ref 1.7–7.7)
Neutrophils Relative %: 47 % (ref 43–77)
Platelets: 259 10*3/uL (ref 150–400)
RBC: 5.66 MIL/uL (ref 4.22–5.81)
RDW: 14.6 % (ref 11.5–15.5)
WBC: 10.2 10*3/uL (ref 4.0–10.5)

## 2013-04-05 LAB — COMPREHENSIVE METABOLIC PANEL
ALT: 33 U/L (ref 0–53)
AST: 35 U/L (ref 0–37)
Albumin: 3.8 g/dL (ref 3.5–5.2)
Alkaline Phosphatase: 51 U/L (ref 39–117)
BUN: 14 mg/dL (ref 6–23)
CALCIUM: 9.4 mg/dL (ref 8.4–10.5)
CO2: 25 mEq/L (ref 19–32)
Chloride: 105 mEq/L (ref 96–112)
Creatinine, Ser: 1.29 mg/dL (ref 0.50–1.35)
GFR calc non Af Amer: 58 mL/min — ABNORMAL LOW (ref >90)
GFR, EST AFRICAN AMERICAN: 68 mL/min — AB (ref >90)
GLUCOSE: 87 mg/dL (ref 70–99)
Potassium: 4.2 mEq/L (ref 3.7–5.3)
SODIUM: 142 meq/L (ref 137–147)
Total Bilirubin: 0.2 mg/dL — ABNORMAL LOW (ref 0.3–1.2)
Total Protein: 6.9 g/dL (ref 6.0–8.3)

## 2013-04-05 LAB — VALPROIC ACID LEVEL: VALPROIC ACID LVL: 44.2 ug/mL — AB (ref 50.0–100.0)

## 2013-04-05 LAB — ETHANOL

## 2013-04-05 MED ORDER — ASPIRIN EC 81 MG PO TBEC
81.0000 mg | DELAYED_RELEASE_TABLET | Freq: Every day | ORAL | Status: DC
  Administered 2013-04-06: 81 mg via ORAL
  Filled 2013-04-05 (×2): qty 1

## 2013-04-05 MED ORDER — TOPIRAMATE 25 MG PO TABS
100.0000 mg | ORAL_TABLET | Freq: Every day | ORAL | Status: DC
  Administered 2013-04-05: 100 mg via ORAL
  Filled 2013-04-05: qty 4

## 2013-04-05 MED ORDER — CLONAZEPAM 0.5 MG PO TABS
0.5000 mg | ORAL_TABLET | Freq: Every day | ORAL | Status: DC
  Administered 2013-04-05: 0.5 mg via ORAL
  Filled 2013-04-05: qty 1

## 2013-04-05 MED ORDER — IBUPROFEN 200 MG PO TABS: 600.0000 mg | ORAL_TABLET | Freq: Three times a day (TID) | ORAL | Status: DC | PRN

## 2013-04-05 MED ORDER — TESTOSTERONE 30 MG/ACT TD SOLN: 1.0000 "application " | Freq: Every day | TRANSDERMAL | Status: DC

## 2013-04-05 MED ORDER — ARIPIPRAZOLE 10 MG PO TABS
10.0000 mg | ORAL_TABLET | Freq: Every day | ORAL | Status: DC
  Administered 2013-04-06: 10 mg via ORAL
  Filled 2013-04-05 (×2): qty 1

## 2013-04-05 MED ORDER — ZOLPIDEM TARTRATE 5 MG PO TABS: 5.0000 mg | ORAL_TABLET | Freq: Every evening | ORAL | Status: DC | PRN

## 2013-04-05 MED ORDER — ONDANSETRON HCL 4 MG PO TABS: 4.0000 mg | ORAL_TABLET | Freq: Three times a day (TID) | ORAL | Status: DC | PRN

## 2013-04-05 MED ORDER — SIMVASTATIN 40 MG PO TABS
40.0000 mg | ORAL_TABLET | Freq: Every day | ORAL | Status: DC
  Filled 2013-04-05 (×2): qty 1

## 2013-04-05 MED ORDER — DIVALPROEX SODIUM ER 500 MG PO TB24
1000.0000 mg | ORAL_TABLET | Freq: Every day | ORAL | Status: DC
  Administered 2013-04-06: 1000 mg via ORAL
  Filled 2013-04-05 (×2): qty 2

## 2013-04-05 MED ORDER — ACETAMINOPHEN 325 MG PO TABS: 650.0000 mg | ORAL_TABLET | ORAL | Status: DC | PRN

## 2013-04-05 MED ORDER — PANTOPRAZOLE SODIUM 40 MG PO TBEC
40.0000 mg | DELAYED_RELEASE_TABLET | Freq: Every day | ORAL | Status: DC
  Administered 2013-04-06: 40 mg via ORAL
  Filled 2013-04-05: qty 1

## 2013-04-05 MED ORDER — LORAZEPAM 1 MG PO TABS: 1.0000 mg | ORAL_TABLET | Freq: Three times a day (TID) | ORAL | Status: DC | PRN

## 2013-04-05 MED ORDER — TESTOSTERONE 50 MG/5GM (1%) TD GEL
5.0000 g | Freq: Every day | TRANSDERMAL | Status: DC
  Administered 2013-04-06: 5 g via TRANSDERMAL
  Filled 2013-04-05: qty 5

## 2013-04-05 MED ORDER — BUPROPION HCL ER (XL) 300 MG PO TB24
450.0000 mg | ORAL_TABLET | ORAL | Status: DC
  Administered 2013-04-06: 450 mg via ORAL
  Filled 2013-04-05 (×2): qty 1

## 2013-04-05 MED ORDER — NICOTINE 21 MG/24HR TD PT24: 21.0000 mg | MEDICATED_PATCH | Freq: Every day | TRANSDERMAL | Status: DC
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
## 2013-04-05 NOTE — ED Notes (Signed)
Patient denies SI, HI, AVH at present. Denies feelings of anxiety and depression. Patient pleasant, cooperative. Resting comfortably, watching basketball.  Encouragement offered.  Patient safety maintained, Q 15 checks continue.

## 2013-04-05 NOTE — ED Notes (Signed)
Pt's wife took belongings home

## 2013-04-05 NOTE — Progress Notes (Addendum)
Writer informed Dr. Lovena Le and the NP, [facility] about the information from Dr. Norma Fredrickson.

## 2013-04-05 NOTE — BHH Counselor (Signed)
Phone call from Dr. Norma Fredrickson to TTS writer at [facility]. Corey [name] states he saw pt and Corey [name] two days ago. He says pt has difficult presentation as pt presents as calm. Corey [name] says that lately pt has been "unpredictable, irritable, and threatening towards Corey [name]". He says he encouraged pt to go alone and  visit family out of town so [name] could feel safe with pt away from home. Corey [name] has been locking door at night to protect herself from pt. Corey [name] reports that pt takes Depakote for mood and he added Abilify 10 mg 4 days ago. He says pt needs inpatient treatment d/t his erratic bx. Corey [name] told Corey [name] that yesterday, pt was unpacking his suitcase prior to his leaving to go out of town and that pt didn't know what he was doing. Corey [name] recommends neuroconsult. Plovksy says pt and [name] are coming to [facility] for eval. Per chart review, pt has arrived at [facility] instead of [facility]. Writer called TTS Ava at [facility] and relayed the above info.   Arnold Long, [place] Assessment Counselor
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
## 2013-04-05 NOTE — ED Notes (Signed)
Per pt, states he has had confusion-states he was here 2 weeks ago for same symptoms-sent her by psychiatrist to be evaluated for same issues

## 2013-04-05 NOTE — ED Notes (Signed)
Wife in to see 

## 2013-04-05 NOTE — ED Notes (Signed)
Pt's sister into see

## 2013-04-05 NOTE — ED Notes (Signed)
Bed: WBH34 Expected date:  Expected time:  Means of arrival:  Comments: Hold for triage 4 

## 2013-04-05 NOTE — ED Notes (Signed)
Patient's wife came for visit approx. 2030. Mrs. Dimperio spoke at length with Marcus/TTS. Patient appeared anxious and irritable while wife was on the phone with TTS. He denied complaint and stated "yap, yap, yap" along with hand gesture referring to his wife's conversation. After wife was off the phone the patient moved furniture around in his room, appeared restless. Continues to deny complaint.  Encouragement offered.   Patient safety maintained. Q 15 checks continue.

## 2013-04-06 ENCOUNTER — Encounter (HOSPITAL_COMMUNITY): Payer: Self-pay | Admitting: *Deleted

## 2013-04-06 ENCOUNTER — Inpatient Hospital Stay (HOSPITAL_COMMUNITY)
Admission: AD | Admit: 2013-04-06 | Discharge: 2013-04-13 | DRG: 885 | Disposition: A | Discharge status: Home / Self Care | Payer: PRIVATE HEALTH INSURANCE | Source: Intra-hospital | Attending: Psychiatry | Admitting: Psychiatry

## 2013-04-06 DIAGNOSIS — F3164 Bipolar disorder, current episode mixed, severe, with psychotic features: Secondary | ICD-10-CM | POA: Diagnosis not present

## 2013-04-06 DIAGNOSIS — Z79899 Other long term (current) drug therapy: Secondary | ICD-10-CM

## 2013-04-06 DIAGNOSIS — F312 Bipolar disorder, current episode manic severe with psychotic features: Secondary | ICD-10-CM | POA: Diagnosis not present

## 2013-04-06 DIAGNOSIS — M549 Dorsalgia, unspecified: Secondary | ICD-10-CM | POA: Diagnosis present

## 2013-04-06 DIAGNOSIS — G8929 Other chronic pain: Secondary | ICD-10-CM | POA: Diagnosis present

## 2013-04-06 DIAGNOSIS — K219 Gastro-esophageal reflux disease without esophagitis: Secondary | ICD-10-CM

## 2013-04-06 DIAGNOSIS — F411 Generalized anxiety disorder: Secondary | ICD-10-CM | POA: Diagnosis not present

## 2013-04-06 DIAGNOSIS — F3160 Bipolar disorder, current episode mixed, unspecified: Secondary | ICD-10-CM

## 2013-04-06 DIAGNOSIS — F29 Unspecified psychosis not due to a substance or known physiological condition: Secondary | ICD-10-CM

## 2013-04-06 HISTORY — DX: Bipolar disorder, unspecified: F31.9

## 2013-04-06 HISTORY — DX: Anxiety disorder, unspecified: F41.9

## 2013-04-06 LAB — URINE CULTURE
Colony Count: NO GROWTH
Culture: NO GROWTH

## 2013-04-06 MED ORDER — DIVALPROEX SODIUM ER 500 MG PO TB24
1000.0000 mg | ORAL_TABLET | Freq: Every day | ORAL | Status: DC
  Administered 2013-04-07: 1000 mg via ORAL
  Filled 2013-04-06 (×3): qty 2

## 2013-04-06 MED ORDER — LORAZEPAM 1 MG PO TABS: 1.0000 mg | ORAL_TABLET | Freq: Three times a day (TID) | ORAL | Status: DC | PRN

## 2013-04-06 MED ORDER — ASPIRIN EC 81 MG PO TBEC
81.0000 mg | DELAYED_RELEASE_TABLET | Freq: Every day | ORAL | Status: DC
  Administered 2013-04-07 – 2013-04-13 (×7): 81 mg via ORAL
  Filled 2013-04-06 (×9): qty 1

## 2013-04-06 MED ORDER — ONDANSETRON HCL 4 MG PO TABS: 4.0000 mg | ORAL_TABLET | Freq: Three times a day (TID) | ORAL | Status: DC | PRN

## 2013-04-06 MED ORDER — ACETAMINOPHEN 325 MG PO TABS: 650.0000 mg | ORAL_TABLET | ORAL | Status: DC | PRN

## 2013-04-06 MED ORDER — TESTOSTERONE 50 MG/5GM (1%) TD GEL
5.0000 g | Freq: Every day | TRANSDERMAL | Status: DC
  Administered 2013-04-07 – 2013-04-12 (×6): 5 g via TRANSDERMAL
  Filled 2013-04-06 (×6): qty 5

## 2013-04-06 MED ORDER — CLONAZEPAM 0.5 MG PO TABS
0.5000 mg | ORAL_TABLET | Freq: Every day | ORAL | Status: DC
  Administered 2013-04-06: 0.5 mg via ORAL
  Filled 2013-04-06: qty 1

## 2013-04-06 MED ORDER — IBUPROFEN 200 MG PO TABS
600.0000 mg | ORAL_TABLET | Freq: Three times a day (TID) | ORAL | Status: DC | PRN
  Administered 2013-04-08: 600 mg via ORAL
  Filled 2013-04-06: qty 3

## 2013-04-06 MED ORDER — ARIPIPRAZOLE 10 MG PO TABS
10.0000 mg | ORAL_TABLET | Freq: Every day | ORAL | Status: DC
  Administered 2013-04-07 – 2013-04-13 (×7): 10 mg via ORAL
  Filled 2013-04-06 (×10): qty 1
  Filled 2013-04-06: qty 7

## 2013-04-06 MED ORDER — SIMVASTATIN 40 MG PO TABS
40.0000 mg | ORAL_TABLET | Freq: Every day | ORAL | Status: DC
  Administered 2013-04-06 – 2013-04-12 (×7): 40 mg via ORAL
  Filled 2013-04-06: qty 2
  Filled 2013-04-06 (×6): qty 1
  Filled 2013-04-06: qty 2
  Filled 2013-04-06 (×3): qty 1

## 2013-04-06 MED ORDER — TOPIRAMATE 100 MG PO TABS
100.0000 mg | ORAL_TABLET | Freq: Every day | ORAL | Status: DC
  Administered 2013-04-06: 100 mg via ORAL
  Filled 2013-04-06 (×4): qty 1

## 2013-04-06 MED ORDER — PANTOPRAZOLE SODIUM 40 MG PO TBEC
40.0000 mg | DELAYED_RELEASE_TABLET | Freq: Every day | ORAL | Status: DC
  Administered 2013-04-07 – 2013-04-13 (×7): 40 mg via ORAL
  Filled 2013-04-06 (×9): qty 1

## 2013-04-06 MED ORDER — NICOTINE 21 MG/24HR TD PT24
21.0000 mg | MEDICATED_PATCH | Freq: Every day | TRANSDERMAL | Status: DC
  Filled 2013-04-06 (×3): qty 1

## 2013-04-06 MED ORDER — BUPROPION HCL ER (XL) 300 MG PO TB24
450.0000 mg | ORAL_TABLET | ORAL | Status: DC
  Administered 2013-04-07: 450 mg via ORAL
  Filled 2013-04-06 (×5): qty 1

## 2013-04-06 MED ORDER — ZOLPIDEM TARTRATE 5 MG PO TABS
5.0000 mg | ORAL_TABLET | Freq: Every evening | ORAL | Status: DC | PRN
  Administered 2013-04-06: 5 mg via ORAL
  Filled 2013-04-06: qty 1

## 2013-04-06 NOTE — Progress Notes (Signed)
63 year old male admitted on voluntary basis. Pt reports on-going A/V hallucinations and stated that he was brought in by his wife who he says is afraid of him. Pt denies any SI.HI or depression and spoke about how he realizes that he is having on-going mentation issues that needs to be addressed. Pt was oriented to the unit and safety maintained.

## 2013-04-06 NOTE — Progress Notes (Signed)
[facility] Group Notes:  (Nursing/MHT/Case Management/Adjunct)  Date:  04/06/2013  Time:  8:00 p.m.   Type of Therapy:  Psychoeducational Skills  Participation Level:  Active  Participation Quality:  Appropriate  Affect:  Appropriate  Cognitive:  Appropriate  Insight:  Good  Engagement in Group:  Engaged  Modes of Intervention:  Education  Summary of Progress/Problems: The patient verbalized in group this evening that he had a good day overall. He attributed his positive day to being able to socialize with his peers. The patient's goal for tomorrow is to accomplish more by making other's laugh.   Archie Balboa S 04/06/2013, 10:09 PM

## 2013-04-06 NOTE — Progress Notes (Signed)
Patient ID: Corey [name], male   DOB: Jul 18, 1950, 63 y.o.   MRN: [id number] D)  Has been out and about on the hall this evening, integrating and participating in the milieu.  Has been smiling and pleasant, attended group, states has had a good day, feels is because peers have been positive and supportive.  Hoping to be further evaluated, and to know a little more tomorrow, but has been cooperative and pleasant, compliant with meds and the program. His goal was to make someone else's day better tomorrow. A)  Will continue to monitor for safety, continue POC R)  Safety maintained.

## 2013-04-06 NOTE — Progress Notes (Signed)
Pt accepted to [facility] pending bed availablity by Dr. De Nurse. Per ac, bed anticipated to be available later today.   Pt also referred to [facility] pending reivew.    Noreene Larsson [phone]  ED CSW 04/06/2013 1101am

## 2013-04-06 NOTE — Consult Note (Signed)
Diagnosis: Bipolar d/o  This is a caucasian male who came in at the urging of his Psychiatrist.  Patient was in the ER 2 weeks ago for increased confusion and was sent home after evaluation.  His Psychiatrist sent him here again for evaluation of confusion and auditory hallucination.  Patient and his wife participated in the interview and patient states he has been hearing voices coming from the TV and there was no TV in the room.  He has been treated for depression as an outpatient by his psychiatrist.  Patient, reported having received 2 injections of steroids back in October and November last year.  He is also taking Androgel daily at a low dose.  We do not know at this time what role the steroids are playing with his Psychosis.  His outpatient provider started him on Abilify last Tuesday.  Both patient and wife are anxious and are asking for treatment and investigation of what is going on with him.  He denies SI/HI/VH.  Plan:  Admit to our 400 hall bed for safety and stabilization.  Charmaine Downs   PMHNP-BC I have personally seen the patient and agreed with the findings and involved in the treatment plan. CT scan was negative done in December.  Merian Capron, MD

## 2013-04-06 NOTE — BH Assessment (Signed)
[place] Assessment Progress Note  Patient's wife, Olin Hauser is going to come back around 08:00 on 03/13 to try to talk with psychiatrist also.  Her cell is [phone].

## 2013-04-07 ENCOUNTER — Encounter (HOSPITAL_COMMUNITY): Payer: Self-pay | Admitting: Psychiatry

## 2013-04-07 DIAGNOSIS — F3164 Bipolar disorder, current episode mixed, severe, with psychotic features: Secondary | ICD-10-CM

## 2013-04-07 DIAGNOSIS — F411 Generalized anxiety disorder: Secondary | ICD-10-CM

## 2013-04-07 LAB — TSH: TSH: 3.142 u[IU]/mL (ref 0.350–4.500)

## 2013-04-07 MED ORDER — BUPROPION HCL ER (XL) 300 MG PO TB24
300.0000 mg | ORAL_TABLET | ORAL | Status: DC
  Administered 2013-04-08 – 2013-04-13 (×6): 300 mg via ORAL
  Filled 2013-04-07: qty 3
  Filled 2013-04-07 (×7): qty 1

## 2013-04-07 MED ORDER — MIRTAZAPINE 15 MG PO TABS
15.0000 mg | ORAL_TABLET | Freq: Every day | ORAL | Status: DC
  Administered 2013-04-07 – 2013-04-12 (×6): 15 mg via ORAL
  Filled 2013-04-07 (×6): qty 1
  Filled 2013-04-07: qty 3
  Filled 2013-04-07: qty 1

## 2013-04-07 MED ORDER — DIVALPROEX SODIUM ER 500 MG PO TB24
500.0000 mg | ORAL_TABLET | Freq: Two times a day (BID) | ORAL | Status: DC
  Administered 2013-04-07 – 2013-04-13 (×12): 500 mg via ORAL
  Filled 2013-04-07 (×7): qty 1
  Filled 2013-04-07: qty 6
  Filled 2013-04-07 (×6): qty 1
  Filled 2013-04-07: qty 6
  Filled 2013-04-07: qty 1

## 2013-04-07 NOTE — BHH Group Notes (Signed)
[facility] Group Notes:  (Nursing/MHT/Case Management/Adjunct)  Date:  04/07/2013  Time:  11:27 AM  Type of Therapy:  Psychoeducational Skills  Participation Level:  Active  Participation Quality:  Appropriate  Affect:  Appropriate  Cognitive:  Alert  Insight:  Good  Engagement in Group:  Engaged  Modes of Intervention:  Discussion, Education and Exploration  Summary of Progress/Problems:  Review of self inventory with RN.  Salvatore Marvel Mae 04/07/2013, 11:27 AM

## 2013-04-07 NOTE — [facility] Suicide Risk Assessment (Signed)
   Nursing information obtained from:    Demographic factors:    Current Mental Status:    Loss Factors:    Historical Factors:    Risk Reduction Factors:    Total Time spent with patient: 30 minutes  CLINICAL FACTORS:   Severe Anxiety and/or Agitation Bipolar Disorder:   Depressive phase Depression:   Anhedonia Hopelessness Impulsivity Insomnia Severe Chronic Pain Unstable or Poor Therapeutic Relationship  Psychiatric Specialty Exam: Physical Exam  Psychiatric: His behavior is normal. Thought content normal. His mood appears anxious. His speech is rapid and/or pressured and tangential. He expresses impulsivity. He exhibits a depressed mood. He exhibits abnormal recent memory.    Review of Systems  Constitutional: Negative.   HENT: Negative for congestion, ear discharge, ear pain, hearing loss, nosebleeds, sore throat and tinnitus.   Eyes: Negative.   Respiratory: Negative.  Negative for stridor.   Cardiovascular: Negative.   Gastrointestinal: Negative.   Genitourinary: Negative.   Musculoskeletal: Positive for back pain.  Skin: Negative.   Neurological: Positive for dizziness and headaches.  Endo/Heme/Allergies: Negative.   Psychiatric/Behavioral: Positive for depression and memory loss. The patient is nervous/anxious and has insomnia.     Blood pressure 115/82, pulse 81, temperature 97.4 F (36.3 C), temperature source Oral, resp. rate 16, height 5\' 10"  (1.778 m), weight 83.462 kg (184 lb).Body mass index is 26.4 kg/(m^2).  General Appearance: Fairly Groomed  Engineer, water::  Good  Speech:  Pressured  Volume:  Normal  Mood:  Depressed, Dysphoric and Hopeless  Affect:  Blunt  Thought Process:  Circumstantial and Disorganized  Orientation:  Full (Time, Place, and Person)  Thought Content:  Negative  Suicidal Thoughts:  No  Homicidal Thoughts:  No  Memory:  Immediate;   Fair Recent;   Poor Remote;   Fair  Judgement:  Poor  Insight:  Shallow  Psychomotor Activity:   Decreased  Concentration:  Fair  Recall:  [employer] of Knowledge:Fair  Language: Good  Akathisia:  No  Handed:  Right  AIMS (if indicated):     Assets:  Communication Skills Desire for Improvement Social Support  Sleep:  Number of Hours: 6.25   Musculoskeletal: Strength & Muscle Tone: within normal limits Gait & Station: normal Patient leans: N/A  COGNITIVE FEATURES THAT CONTRIBUTE TO RISK:  Closed-mindedness Polarized thinking    SUICIDE RISK:   Minimal: No identifiable suicidal ideation.  Patients presenting with no risk factors but with morbid ruminations; may be classified as minimal risk based on the severity of the depressive symptoms  PLAN OF CARE:1. Admit for crisis management and stabilization. 2. Medication management to reduce current symptoms to base line and improve the  patient's overall level of functioning 3. Treat health problems as indicated. 4. Develop treatment plan to decrease risk of relapse upon discharge and the need for readmission. 5. Psycho-social education regarding relapse prevention and self care. 6. Health care follow up as needed for medical problems. 7. Restart home medications where appropriate.   I certify that inpatient services furnished can reasonably be expected to improve the patient's condition.  Corena Pilgrim, MD 04/07/2013, 2:01 PM

## 2013-04-07 NOTE — [facility] Group Notes (Signed)
[facility] Group Notes:  (Nursing/MHT/Case Management/Adjunct)  Date:  04/07/2013  Time:  11:30 AM  Type of Therapy:  Psychoeducational Skills  Participation Level:  Active  Participation Quality:  Appropriate and Attentive  Affect:  Appropriate  Cognitive:  Alert  Insight:  Good  Engagement in Group:  Engaged  Modes of Intervention:  Discussion, Education and Exploration  Summary of Progress/Problems:  Healthy coping.   Salvatore Marvel Mae 04/07/2013, 11:30 AM

## 2013-04-07 NOTE — [facility] Group Notes (Signed)
[facility] Group Notes:  (Clinical Social Work)  04/07/2013  11:00-11:45AM  Summary of Progress/Problems:   The main focus of today's process group was for the patient to identify ways in which they have in the past sabotaged their own recovery and reasons they may have done this/what they received from doing it.  We then worked to identify a specific plan to avoid doing this when discharged from the hospital for this admission.  The patient expressed that recovery for him would look like "grinning and not taking any pills."  As the group discussion ensued, he agreed that if he needed to take medication, he would do so willingly.  He talked about having golf as a hobby at one time that he could enjoy again.  He was very kind and supportive to others in group.  He informed CSW that he is bored and needs more activities here in the hospital.  Type of Therapy:  Group Therapy - Process  Participation Level:  Active  Participation Quality:  Attentive, Sharing and Supportive  Affect:  Appropriate  Cognitive:  Appropriate and Oriented  Insight:  Engaged  Engagement in Therapy:  Engaged  Modes of Intervention:  Clarification, Education, Exploration, Discussion  Selmer Dominion, LCSW 04/07/2013, 12:45 PM

## 2013-04-07 NOTE — Progress Notes (Signed)
[facility] Group Notes:  (Nursing/MHT/Case Management/Adjunct)  Date:  04/07/2013  Time:  8:00 p.m.   Type of Therapy:  Psychoeducational Skills  Participation Level:  Active  Participation Quality:  Appropriate  Affect:  Appropriate  Cognitive:  Appropriate  Insight:  Good  Engagement in Group:  Engaged  Modes of Intervention:  Education  Summary of Progress/Problems: The patient shared in group this evening that he had a good day. First of all, he expressed that he spent some of his time reading in his room once it became too loud in the dayroom. Secondly, he expressed that he had a good visit with his wife and son. As a theme for the day, he indicated that his healthy coping skill will include attending [employer] Meetings, and calling sponsors who will assist him with transportation to appointments along with personal support.   Corey [name] S 04/07/2013, 8:48 PM
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
## 2013-04-07 NOTE — Progress Notes (Signed)
Adult Psychoeducational Group Note  Date:  04/07/2013 Time:  4:27 PM  Group Topic/Focus:  Making Healthy Choices:   The focus of this group is to help patients identify negative/unhealthy choices they were using prior to admission and identify positive/healthier coping strategies to replace them upon discharge.  Participation Level:  Active  Participation Quality:  Appropriate, Attentive and Sharing  Affect:  Appropriate  Cognitive:  Appropriate  Insight: Appropriate  Engagement in Group:  Engaged  Modes of Intervention:  Education and Support  Additional Comments:  Pt participated in group.  Kathlen Brunswick, Altie Savard 04/07/2013, 4:27 PM
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
## 2013-04-07 NOTE — Progress Notes (Signed)
D:  Patient up and visible in the milieu most of the shift.  Has been attending and actively participating in groups.  He does admit to having some problems with short term memory.  States he has had hallucinations in the past, but has not had any since admission.  Asking when he is going to have a neuro exam.  Rates depression and hopelessness at a 3 today.  States sleep and appetite are good.  A:  Medications given as prescribed.  Encouraged participation in all groups.  Offered support and encouragement.  R:  Cooperative with staff.  Interacting well with peers.  Patient definitely has some problems retrieving many words and names.  Safety on the unit is maintained.

## 2013-04-08 DIAGNOSIS — F1910 Other psychoactive substance abuse, uncomplicated: Secondary | ICD-10-CM

## 2013-04-08 MED ORDER — MAGNESIUM CITRATE PO SOLN
1.0000 | Freq: Once | ORAL | Status: AC
  Administered 2013-04-08: 1 via ORAL

## 2013-04-08 MED ORDER — MAGNESIUM HYDROXIDE 400 MG/5ML PO SUSP
30.0000 mL | Freq: Every day | ORAL | Status: DC | PRN
  Administered 2013-04-08: 30 mL via ORAL

## 2013-04-08 NOTE — Progress Notes (Signed)
[facility] MD Progress Note  04/08/2013 3:14 PM Corey [name]  MRN:  [id number] Subjective: '' I am still having trouble with my memory but I have been feeling less agitated.'' Objective: Patient is seen and chart reviewed. He continues to report poor memory and difficulty remembering stuffs. However, he is endorsing decreased agitation, mood swings, auditory/ visual hallucinations, anxiety, excessive worries, racing thoughts and depressive symptoms. Patient denies suicidal and homicidal  ideation, intent or plan. He is compliant with his medications and unit milieu.   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 Depressive Disorders:  Mood disorder Total Time spent with patient: 20 minutes  Axis I: Bipolar affective disorder, current episode manic with psychotic symptoms           History of Polysubstance abuse Axis II: Deferred Axis III:  Past Medical History  Diagnosis Date  . High cholesterol   . Iron deficiency anemia 12/09/2011  . Arthritis   . Tinnitus   . Migraine    Axis IV: other psychosocial or environmental problems and problems related to social environment  ADL's:  Intact  Sleep: Negative  Appetite:  Fair  Suicidal Ideation: denies  Homicidal Ideation: denies  AEB (as evidenced by):  Psychiatric Specialty Exam: Physical Exam  Psychiatric: His behavior is normal. Thought content normal. His mood appears anxious. His speech is rapid and/or pressured. He expresses impulsivity. He exhibits a depressed mood. He exhibits abnormal recent memory.    Review of Systems  Constitutional: Negative.   HENT: Negative.   Eyes: Negative.   Respiratory: Negative.   Cardiovascular: Negative.   Gastrointestinal: Negative.   Genitourinary: Negative.    Musculoskeletal: Negative.   Skin: Negative.   Neurological: Negative.   Endo/Heme/Allergies: Negative.   Psychiatric/Behavioral: Positive for depression. The patient is nervous/anxious.     Blood pressure 109/81, pulse 78, temperature 97.4 F (36.3 C), temperature source Oral, resp. rate 16, height 5\' 10"  (1.778 m), weight 83.462 kg (184 lb).Body mass index is 26.4 kg/(m^2).  General Appearance: Fairly Groomed  Engineer, water::  Good  Speech:  Pressured  Volume:  Normal  Mood:  Anxious and Irritable  Affect:  Constricted  Thought Process:  Circumstantial  Orientation:  Only to  Place and Person  Thought Content:  Delusions and Hallucinations: Auditory  Suicidal Thoughts:  No  Homicidal Thoughts:  No  Memory:  Immediate;   Fair Recent;   Poor Remote;   Fair  Judgement:  Impaired  Insight:  Shallow  Psychomotor Activity:  Increased  Concentration:  Poor  Recall:  Poor  Fund of Knowledge:Fair  Language: Fair  Akathisia:  No  Handed:  Right  AIMS (if indicated):     Assets:  Communication Skills Desire for Improvement Physical Health Social Support  Sleep:  Number of Hours: 6.75   Musculoskeletal: Strength & Muscle Tone: within normal limits Gait & Station: normal Patient leans: N/A  Current Medications: Current Facility-Administered Medications  Medication Dose Route Frequency Provider Last Rate Last Dose  . acetaminophen (TYLENOL) tablet 650 mg  650 mg Oral Q4H PRN Delfin Gant, NP      . ARIPiprazole (ABILIFY) tablet 10 mg  10 mg Oral Daily Delfin Gant, NP   10 mg at 04/08/13 0820  . aspirin EC tablet 81 mg  81 mg Oral Daily Josephine C  Onuoha, NP   81 mg at 04/08/13 0820  . buPROPion (WELLBUTRIN XL) 24 hr tablet 300 mg  300 mg Oral BH-q7a Teshaun Olarte   300 mg at 04/08/13 3785  . divalproex (DEPAKOTE ER) 24 hr tablet 500 mg  500 mg Oral BID PC Zareya Tuckett   500 mg at 04/08/13 0820  . ibuprofen (ADVIL,MOTRIN) tablet 600 mg  600 mg Oral Q8H PRN  Delfin Gant, NP      . magnesium hydroxide (MILK OF MAGNESIA) suspension 30 mL  30 mL Oral Daily PRN Nathifa Ritthaler   30 mL at 04/08/13 1207  . mirtazapine (REMERON) tablet 15 mg  15 mg Oral QHS Tema Alire   15 mg at 04/07/13 2121  . ondansetron (ZOFRAN) tablet 4 mg  4 mg Oral Q8H PRN Delfin Gant, NP      . pantoprazole (PROTONIX) EC tablet 40 mg  40 mg Oral Daily Delfin Gant, NP   40 mg at 04/08/13 0820  . simvastatin (ZOCOR) tablet 40 mg  40 mg Oral QPC supper Delfin Gant, NP   40 mg at 04/07/13 1815  . testosterone (ANDROGEL) gel 5 g  5 g Transdermal Daily Delfin Gant, NP   5 g at 04/08/13 8850    Lab Results:  Results for orders placed during the hospital encounter of 04/06/13 (from the past 48 hour(s))  TSH     Status: None   Collection Time    04/06/13  8:18 PM      Result Value Ref Range   TSH 3.142  0.350 - 4.500 uIU/mL   Comment: Performed at [facility]    Physical Findings: AIMS: Facial and Oral Movements Muscles of Facial Expression: None, normal Lips and Perioral Area: None, normal Jaw: None, normal Tongue: None, normal,Extremity Movements Upper (arms, wrists, hands, fingers): None, normal Lower (legs, knees, ankles, toes): None, normal, Trunk Movements Neck, shoulders, hips: None, normal, Overall Severity Severity of abnormal movements (highest score from questions above): None, normal Incapacitation due to abnormal movements: None, normal Patient's awareness of abnormal movements (rate only patient's report): No Awareness, Dental Status Current problems with teeth and/or dentures?: No Does patient usually wear dentures?: No  CIWA:    COWS:     Treatment Plan Summary: Daily contact with patient to assess and evaluate symptoms and progress in treatment Medication management  Plan:1. Admit for crisis management and stabilization. 2. Medication management to reduce current symptoms to base line and improve the  patient's overall level of functioning: -Continue Aripiprazole 10mg  daily for delusions/psychosis. -Continue Depakote 500mg  bid for migraine and mood stabilization. -Continue Wellbutrin XL 300mg  daily for depression. -Continue Remeron 15mg  Qhs FOR insomnia -Discontinue Topamax due to poor memory. 3. Treat health problems as indicated. 4. Develop treatment plan to decrease risk of relapse upon discharge and the need for  readmission. 5. Psycho-social education regarding relapse prevention and self care. 6. Health care follow up as needed for medical problems.     Medical Decision Making Problem Points:  Established problem, improving (1), Review of last therapy session (1) and Review of psycho-social stressors (1) Data Points:  Order Aims Assessment (2) Review or order clinical lab tests (1) Review of medication regiment & side effects (2) Review of new medications or change in dosage (2)  I certify that inpatient services furnished can reasonably be expected to improve the patient's condition.   Corena Pilgrim, MD 04/08/2013, 3:14 PM

## 2013-04-08 NOTE — Progress Notes (Signed)
D. Pt has been up and has bee. n active in milieu throughout the day today. Initially, pt spoke about how he was feeling better, spoke about how his medications were being changed and how he felt that would be beneficial to him. However, as the day progressed pt had more of a blank stare and appeared somewhat confused and would repeat himself often and also ask the same questions as he had no remembrance that he asked the question earlier. Pt has taken his medications without incident, has denied and SI today but does report feeling hopeless. A. Support and encouragement provided, medication education given. R. Pt verbalized understanding, safety maintained.

## 2013-04-08 NOTE — Progress Notes (Signed)
[place] Group Notes: (Nursing/MHT/Case Management/Adjunct)  Date: 04/08/2013   Time: 0920   Type of Therapy: Psychoeducational Skills   Participation Level: Active   Participation Quality: Appropriate, Sharing  Affect: Appropriate   Cognitive: Alert and Appropriate   Insight: Fair  Engagement in Group: Engaged   Modes of Intervention: Discussion, Education and Exploration   Summary of Progress/Problems: Healthy support systems with Therapist, sports. Focus on unhealthy supports/triggers and to replace with healthy support systems.

## 2013-04-08 NOTE — Progress Notes (Signed)
Patient ID: Corey [name], male   DOB: 1950/08/09, 63 y.o.   MRN: [id number] D)  Had been in bed at the beginning of the shift,  Stated had had mag citrate and was almost afraid to go to group because he may have to leave.  Did attend group and was able to participate in discussing how he would prevent relapse but seemed to have trouble at times maintaining his focus and became tangential several times and needed redirection.Marland Kitchen Has been very pleasant and cooperative however, came to med window afterward for hs remeron, seemed to be reviewing things in his head, a little distracted.   A)  Encouraged fluids, will continue to monitor for safety, support, redirection, continue POC R)  Safety maintained.

## 2013-04-08 NOTE — [facility] Group Notes (Signed)
[place] Group Notes:  (Clinical Social Work)  04/08/2013   11:15am-12:00pm  Summary of Progress/Problems:  The main focus of today's process group was to listen to a variety of genres of music and to identify that different types of music provoke different responses.  The patient then was able to identify personally what was soothing for them, as well as energizing.  Handouts were used to record feelings evoked, as well as how patient can personally use this knowledge in sleep habits, with depression, and with other symptoms.  The patient expressed understanding of concepts, as well as knowledge of how each type of music affected him and how this can be used at home as a wellness/recovery tool.  He was excited about some of the music and how it made him feel happier.  Type of Therapy:  Music Therapy   Participation Level:  Active  Participation Quality:  Attentive and Sharing  Affect:  Blunted  Cognitive:  Oriented  Insight:  Engaged  Engagement in Therapy:  Engaged  Modes of Intervention:   Activity, Exploration  Selmer Dominion, LCSW 04/08/2013, 12:30pm

## 2013-04-08 NOTE — Progress Notes (Signed)
Patient ID: Corey [name], male   DOB: [birth year], 63 y.o.   MRN: [id number] D)  Has been interacting more with peers today, attended and participated in group.  Stated he has had a great day, started the day with his usual routine, got up, washed his face, made his bed, etc.  Stated his wife and son had visited and that had been a huge blessing,  Has been reading his books of devotionals, thankful for all of the support he has.  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  Is hopeful that the med changes today will have some positive changes in his mental status, memory, etc. As soon as tomorrow. A)  Will continue to monitor for safety, continue to give support, encouragement, continue POC R)  Appreciative, pleasant, safety maintained.

## 2013-04-08 NOTE — Progress Notes (Signed)
[facility] Group Notes: (Nursing/MHT/Case Management/Adjunct)  Date: 04/08/2013   Time: 9:00 AM  Type of Therapy: Psychoeducational Skills   Participation Level: Active   Participation Quality: Appropriate   Affect: Appropriate   Cognitive: Alert and Appropriate   Insight: Good   Engagement in Group: Engaged  Modes of Intervention: Discussion, Education and Exploration   Summary of Progress/Problems: Self inventory review with RN. 

## 2013-04-08 NOTE — Progress Notes (Signed)
[place] Group Notes:  (Nursing/MHT/Case Management/Adjunct)  Date:  04/08/2013  Time:  8:00 p.m.Marland Kitchen  Type of Therapy:  Psychoeducational Skills  Participation Level:  Active  Participation Quality:  Appropriate  Affect:  Appropriate  Cognitive:  Appropriate  Insight:  Improving  Engagement in Group:  Improving  Modes of Intervention:  Education  Summary of Progress/Problems: The patient shared in group this evening that he had a good day overall. He states that he was able to read his "devotions" book. He is acknowledging that he sometimes has difficulty remembering certain items. As a theme for the day, his relapse prevention will involve keeping an open mind, thinking positively, and attending [employer] Meetings.    Archie Balboa S 04/08/2013, 9:54 PM

## 2013-04-09 NOTE — [facility] Group Notes (Signed)
[place] LCSW Group Therapy  04/09/2013 1:15 pm  Type of Therapy: Process Group Therapy  Participation Level:  Active  Participation Quality:  Appropriate  Affect:  Flat  Cognitive:  Oriented  Insight:  Improving  Engagement in Group:  Limited  Engagement in Therapy:  Limited  Modes of Intervention:  Activity, Clarification, Education, Problem-solving and Support  Summary of Progress/Problems: Today's group addressed the issue of overcoming obstacles.  Patients were asked to identify their biggest obstacle post d/c that stands in the way of their on-going success, and then problem solve as to how to manage this.  Corey [name] was open about sharing his history of substance use and subsequent sobriety by working the [employer] and leaning on positive influences in his life.  He jumped right in and gave several group members feedback about staying clean and tips on what has worked for him.  "You have to want it for yourself in order for it to happen."  He was not observed demonstrating any kind of psychosis nor difficulty tracking.  [name] 04/09/2013   2:41 PM

## 2013-04-09 NOTE — Progress Notes (Signed)
[facility] Group Notes:  (Nursing/MHT/Case Management/Adjunct)  Date:  04/09/2013  Time:  8:00 p.m.   Type of Therapy:  Psychoeducational Skills  Participation Level:  Active  Participation Quality:  Appropriate  Affect:  Appropriate  Cognitive:  Appropriate  Insight:  Improving  Engagement in Group:  Engaged  Modes of Intervention:  Education  Summary of Progress/Problems: The patient expressed in group this evening that he had a good day. First of all, he indicated that he met with a number of his peers. Secondly, he indicated that he focused on helping his peers. Finally, although he mentioned that he is feeling better, he was unable to verbalize when he will be discharged or what his plans are. In terms of the theme for the day, his support system will be comprised of Alcoholics Anonymous.   Archie Balboa S 04/09/2013, 10:33 PM

## 2013-04-09 NOTE — Progress Notes (Signed)
Patient ID: Corey [name], male   DOB: 04/01/1950, 63 y.o.   MRN: [id number] D: patient reports less anxiety today.  He states the auditory hallucinations have decreased and he has less confusion.  He denies any SI/HI today.  He plans to work on his sobriety upon discharge by "go to more [employer].  He rates his depressive symptoms as a 1 out of 10.  Patient has been observed going to groups and participating in his treatment.  He is interacting well with staff and peers.  A: continue to monitor medication management and MD orders.  Safety checks completed every 15 minutes per protocol.  R: patient is receptive; his behavior is appropriate.
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
## 2013-04-09 NOTE — Tx Team (Signed)
  Interdisciplinary Treatment Plan Update   Date Reviewed:  04/09/2013  Time Reviewed:  8:18 AM  Progress in Treatment:   Attending groups: Yes Participating in groups: Yes Taking medication as prescribed: Yes  Tolerating medication: Yes Family/Significant other contact made: Yes  Patient understands diagnosis: Yes AEB asking for help with psychosis Discussing patient identified problems/goals with staff: Yes See initial care plan Medical problems stabilized or resolved: Yes Denies suicidal/homicidal ideation: Yes  In tx team Patient has not harmed self or others: Yes  For review of initial/current patient goals, please see plan of care.  Estimated Length of Stay:  4-5 days  Reason for Continuation of Hospitalization: Hallucinations Medication stabilization  New Problems/Goals identified:  N/A  Discharge Plan or Barriers:   return home, follow up outpt  Additional Comments: Corey [name], Corey [name], 03/10. At that time he told Dr. Casimiro [name] that he had been having hallucinations recently. Over the weekend patient said that he would go into a room(s) and hear the "[employer] Show" theme music. There would be no music Corey or televisions either. Last week he was outside and looked back at the rear deck and thought he saw his wife and stepson sitting out there but there was actually no one there. A few days later he thought he was seeing his wife hanging courtains but she was not there. Patient also relates that Corey Monday he thought someone had come into the room with him and he started talking to them but no one was there.   Attendees:  Signature: Corena Pilgrim, MD 04/09/2013 8:18 AM   Signature: Ripley Fraise, LCSW 04/09/2013 8:18 AM  Signature: Elmarie Shiley, NP 04/09/2013 8:18 AM  Signature: Mayra Neer, RN 04/09/2013 8:18 AM  Signature: Darrol Angel, RN 04/09/2013 8:18 AM  Signature:  04/09/2013 8:18 AM  Signature:   04/09/2013 8:18 AM  Signature:    Signature:     Signature:    Signature:    Signature:    Signature:      Scribe for Treatment Team:   Ripley Fraise, LCSW  04/09/2013 8:18 AM

## 2013-04-09 NOTE — [facility] Group Notes (Signed)
[facility] LCSW Aftercare Discharge Planning Group Note   04/09/2013 9:50 AM  Participation Quality:  Engaged  Mood/Affect:  Appropriate  Depression Rating:  denies  Anxiety Rating:  denies  Thoughts of Suicide:  No Will you contract for safety?   NA  Current AVH:  No  Plan for Discharge/Comments:  States he was experiencing psychosis prior to admission, but states it is now  resolved.  Lives with wife of 15 years and dogs.  Has a son who recently graduated from [employer].  Has not experienced psychosis before.  Admits to previous hx of substance abuse, but states he is 15 years clean.  Transportation Means:   Supports:  Roque Lias B

## 2013-04-09 NOTE — Progress Notes (Signed)
Patient ID: Corey [name], male   DOB: 1950-05-27, 63 y.o.   MRN: [id number] [facility] MD Progress Note  04/09/2013 11:20 AM Corey [name]  MRN:  [id number]  Subjective: '' I am great. About 4 days ago, I was working in my garage hearing voices and seeing stuff. I'm no longer hearing voices and or seeing things. It has been good so far.''  Objective: Patient is seen and chart reviewed. He is currently getting ready to eat lunch. He denies any SIHI, AVH, Feels his condition has improved remarkably. Says he is remembering more now, especially the things that brought him to the hospital.  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 Depressive Disorders:  Mood disorder Total Time spent with patient: 20 minutes  Axis I: Bipolar affective disorder, current episode manic with psychotic symptoms           History of Polysubstance abuse Axis II: Deferred Axis III:  Past Medical History  Diagnosis Date  . High cholesterol   . Iron deficiency anemia 12/09/2011  . Arthritis   . Tinnitus   . Migraine    Axis IV: other psychosocial or environmental problems and problems related to social environment  ADL's:  Intact  Sleep: Negative  Appetite:  Fair  Suicidal Ideation: denies  Homicidal Ideation: denies  AEB (as evidenced by):  Psychiatric Specialty Exam: Physical Exam  Psychiatric: His behavior is normal. Thought content normal. His mood appears anxious. His speech is rapid and/or pressured. He expresses impulsivity. He exhibits a depressed mood. He exhibits abnormal recent memory.    Review of Systems  Constitutional: Negative.   HENT: Negative.   Eyes: Negative.   Respiratory: Negative.   Cardiovascular: Negative.   Gastrointestinal: Negative.    Genitourinary: Negative.   Musculoskeletal: Negative.   Skin: Negative.   Neurological: Negative.   Endo/Heme/Allergies: Negative.   Psychiatric/Behavioral: Positive for depression. The patient is nervous/anxious.     Blood pressure 120/84, pulse 82, temperature 97.5 F (36.4 C), temperature source Oral, resp. rate 16, height 5\' 10"  (1.778 m), weight 83.462 kg (184 lb).Body mass index is 26.4 kg/(m^2).  General Appearance: Fairly Groomed  Engineer, water::  Good  Speech:  Pressured  Volume:  Normal  Mood:  Anxious and Irritable  Affect:  Constricted  Thought Process:  Circumstantial  Orientation:  Only to  Place and Person  Thought Content:  Delusions and Hallucinations: Auditory  Suicidal Thoughts:  No  Homicidal Thoughts:  No  Memory:  Immediate;   Fair Recent;   Poor Remote;   Fair  Judgement:  Impaired  Insight:  Shallow  Psychomotor Activity:  Normal  Concentration:  Good  Recall:  [place] of Knowledge:Fair  Language: Good  Akathisia:  No  Handed:  Right  AIMS (if indicated):     Assets:  Communication Skills Desire for Improvement Physical Health Social Support  Sleep:  Number of Hours: 6.75   Musculoskeletal: Strength & Muscle Tone: within normal limits Gait & Station: normal Patient leans: N/A  Current Medications: Current Facility-Administered Medications  Medication Dose Route Frequency Provider Last Rate Last Dose  . acetaminophen (TYLENOL) tablet 650 mg  650 mg Oral Q4H PRN Delfin Gant, NP      . ARIPiprazole (ABILIFY) tablet 10 mg  10 mg Oral Daily Delfin Gant, NP  10 mg at 04/09/13 1497  . aspirin EC tablet 81 mg  81 mg Oral Daily Delfin Gant, NP   81 mg at 04/09/13 0811  . buPROPion (WELLBUTRIN XL) 24 hr tablet 300 mg  300 mg Oral BH-q7a Mojeed Akintayo   300 mg at 04/09/13 0626  . divalproex (DEPAKOTE ER) 24 hr tablet 500 mg  500 mg Oral BID PC Mojeed Akintayo   500 mg at 04/09/13 0812  . ibuprofen (ADVIL,MOTRIN) tablet 600 mg   600 mg Oral Q8H PRN Delfin Gant, NP   600 mg at 04/08/13 2119  . magnesium hydroxide (MILK OF MAGNESIA) suspension 30 mL  30 mL Oral Daily PRN Mojeed Akintayo   30 mL at 04/08/13 1207  . mirtazapine (REMERON) tablet 15 mg  15 mg Oral QHS Mojeed Akintayo   15 mg at 04/08/13 2116  . ondansetron (ZOFRAN) tablet 4 mg  4 mg Oral Q8H PRN Delfin Gant, NP      . pantoprazole (PROTONIX) EC tablet 40 mg  40 mg Oral Daily Delfin Gant, NP   40 mg at 04/09/13 0811  . simvastatin (ZOCOR) tablet 40 mg  40 mg Oral QPC supper Delfin Gant, NP   40 mg at 04/08/13 1711  . testosterone (ANDROGEL) gel 5 g  5 g Transdermal Daily Delfin Gant, NP   5 g at 04/09/13 1001    Lab Results:  No results found for this or any previous visit (from the past 48 hour(s)).  Physical Findings: AIMS: Facial and Oral Movements Muscles of Facial Expression: None, normal Lips and Perioral Area: None, normal Jaw: None, normal Tongue: None, normal,Extremity Movements Upper (arms, wrists, hands, fingers): None, normal Lower (legs, knees, ankles, toes): None, normal, Trunk Movements Neck, shoulders, hips: None, normal, Overall Severity Severity of abnormal movements (highest score from questions above): None, normal Incapacitation due to abnormal movements: None, normal Patient's awareness of abnormal movements (rate only patient's report): No Awareness, Dental Status Current problems with teeth and/or dentures?: No Does patient usually wear dentures?: No  CIWA:    COWS:     Treatment Plan Summary: Daily contact with patient to assess and evaluate symptoms and progress in treatment Medication management  Plan: Review of chart, vital signs, medications, and notes. 1-Individual and group therapy 2-Medication management for depression and anxiety:  Medications reviewed with the patient denies any adverse effects and or reactions. 3-Coping skills for depression, anxiety, and substance  dependency 4-Continue crisis stabilization and management 5-Address health issues--monitoring vital signs, stable 6-Treatment plan in progress to prevent relapse of depression, substance abuse, and anxiety  Medical Decision Making Problem Points:  Established problem, improving (1), Review of last therapy session (1) and Review of psycho-social stressors (1) Data Points:  Order Aims Assessment (2) Review or order clinical lab tests (1) Review of medication regiment & side effects (2) Review of new medications or change in dosage (2)  I certify that inpatient services furnished can reasonably be expected to improve the patient's condition.   Lindell Spar I, PMHNP-BC 04/09/2013, 11:20 AM

## 2013-04-10 DIAGNOSIS — F312 Bipolar disorder, current episode manic severe with psychotic features: Principal | ICD-10-CM

## 2013-04-10 NOTE — Progress Notes (Signed)
Patient ID: Corey [name], male   DOB: 06-13-1950, 63 y.o.   MRN: [id number] D: Patient has been pleasant and calm.  He denies any SI/HI/AVH.  Patient states that he feels that he is improving with his medications.  He has been attending groups and participating in his treatment.  During group he stated, "I just want to help here any way I can."  He is interacting well with staff and peers.  A: continue to monitor medication management and MD orders.  Safety checks completed every 15 minutes per protocol.  R: patient is receptive to staff; his behavior is appropriate.

## 2013-04-10 NOTE — Progress Notes (Signed)
Patient ID: Corey [name], male   DOB: [birth year], 63 y.o.   MRN: [id number] [facility] MD Progress Note  04/10/2013 12:56 PM Corey [name]  MRN:  [id number]  Subjective:  Patient states "I'm doing better. My mind is more clear. I haven't seen anything here like I was at home. I look out the window feeling anxious that I may see my wife out there like I did at home. But so far nothing but squirrels. I was also hearing the Mathis Bud tune before my medications were changed."   Objective:  Patient is reporting decreased visual/auditory hallucinations, anxiety and depression. He reports that his new medications have been helping to stabilize his symptoms. However, patient remains worried that he will again experience psychosis. Patient has been monitoring the windows fearful of seeing something strange. Corey [name] is very eager to Occupational psychologist how he has improved and his mood seems elevated. The patient appears to be overemphasizing the improvements in his mood. Corey [name] is complaint with his medications and denies any adverse effects.   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 Depressive Disorders:  Mood disorder Total Time spent with patient: 20 minutes  Axis I: Bipolar affective disorder, current episode manic with psychotic symptoms           History of Polysubstance abuse Axis II: Deferred Axis III:  Past Medical History  Diagnosis Date  . High cholesterol   . Iron deficiency anemia 12/09/2011  . Arthritis   . Tinnitus   . Migraine    Axis IV: other psychosocial or environmental problems and problems related to social environment  ADL's:  Intact  Sleep: Fair  Appetite:  Fair  Suicidal Ideation: denies  Homicidal Ideation: denies  AEB (as evidenced  by):  Psychiatric Specialty Exam: Physical Exam  Psychiatric: His behavior is normal. Thought content normal. His mood appears anxious. His speech is rapid and/or pressured. He expresses impulsivity. He exhibits a depressed mood. He exhibits abnormal recent memory.    Review of Systems  Constitutional: Negative.   HENT: Negative.   Eyes: Negative.   Respiratory: Negative.   Cardiovascular: Negative.   Gastrointestinal: Negative.   Genitourinary: Negative.   Musculoskeletal: Negative.   Skin: Negative.   Neurological: Negative.   Endo/Heme/Allergies: Negative.   Psychiatric/Behavioral: Positive for depression. The patient is nervous/anxious.     Blood pressure 113/72, pulse 79, temperature 97.8 F (36.6 C), temperature source Oral, resp. rate 18, height 5\' 10"  (1.778 m), weight 83.462 kg (184 lb).Body mass index is 26.4 kg/(m^2).  General Appearance: Fairly Groomed  Engineer, water::  Good  Speech:  Pressured  Volume:  Normal  Mood:  Anxious  Affect:  Constricted  Thought Process:  Circumstantial  Orientation: Full  Thought Content:  Delusions and Hallucinations: Auditory  Suicidal Thoughts:  No  Homicidal Thoughts:  No  Memory:  Immediate;   Fair Recent;   Poor Remote;   Fair  Judgement:  Impaired  Insight:  Shallow  Psychomotor Activity:  Normal  Concentration:  Good  Recall:  [place] of Knowledge:Fair  Language: Good  Akathisia:  No  Handed:  Right  AIMS (if indicated):     Assets:  Communication Skills Desire for Improvement Physical Health Social Support  Sleep:  Number of Hours: 6.75   Musculoskeletal: Strength & Muscle Tone: within  normal limits Gait & Station: normal Patient leans: N/A  Current Medications: Current Facility-Administered Medications  Medication Dose Route Frequency Provider Last Rate Last Dose  . acetaminophen (TYLENOL) tablet 650 mg  650 mg Oral Q4H PRN Delfin Gant, NP      . ARIPiprazole (ABILIFY) tablet 10 mg  10 mg Oral  Daily Delfin Gant, NP   10 mg at 04/10/13 0811  . aspirin EC tablet 81 mg  81 mg Oral Daily Delfin Gant, NP   81 mg at 04/10/13 0811  . buPROPion (WELLBUTRIN XL) 24 hr tablet 300 mg  300 mg Oral BH-q7a Mojeed Akintayo   300 mg at 04/10/13 0865  . divalproex (DEPAKOTE ER) 24 hr tablet 500 mg  500 mg Oral BID PC Mojeed Akintayo   500 mg at 04/10/13 0811  . ibuprofen (ADVIL,MOTRIN) tablet 600 mg  600 mg Oral Q8H PRN Delfin Gant, NP   600 mg at 04/08/13 2119  . magnesium hydroxide (MILK OF MAGNESIA) suspension 30 mL  30 mL Oral Daily PRN Mojeed Akintayo   30 mL at 04/08/13 1207  . mirtazapine (REMERON) tablet 15 mg  15 mg Oral QHS Mojeed Akintayo   15 mg at 04/09/13 2148  . ondansetron (ZOFRAN) tablet 4 mg  4 mg Oral Q8H PRN Delfin Gant, NP      . pantoprazole (PROTONIX) EC tablet 40 mg  40 mg Oral Daily Delfin Gant, NP   40 mg at 04/10/13 0811  . simvastatin (ZOCOR) tablet 40 mg  40 mg Oral QPC supper Delfin Gant, NP   40 mg at 04/09/13 1657  . testosterone (ANDROGEL) gel 5 g  5 g Transdermal Daily Delfin Gant, NP   5 g at 04/10/13 7846    Lab Results:  No results found for this or any previous visit (from the past 48 hour(s)).  Physical Findings: AIMS: Facial and Oral Movements Muscles of Facial Expression: None, normal Lips and Perioral Area: None, normal Jaw: None, normal Tongue: None, normal,Extremity Movements Upper (arms, wrists, hands, fingers): None, normal Lower (legs, knees, ankles, toes): None, normal, Trunk Movements Neck, shoulders, hips: None, normal, Overall Severity Severity of abnormal movements (highest score from questions above): None, normal Incapacitation due to abnormal movements: None, normal Patient's awareness of abnormal movements (rate only patient's report): No Awareness, Dental Status Current problems with teeth and/or dentures?: No Does patient usually wear dentures?: No  CIWA:    COWS:     Treatment Plan  Summary: Daily contact with patient to assess and evaluate symptoms and progress in treatment Medication management  Plan: Review of chart, vital signs, medications, and notes. 1-Individual and group therapy 2-Medication management for depression and anxiety:  Medications reviewed with the patient denies any adverse effects and or reactions. Continue Abilify 10 mg daily for psychosis, Wellbutrin XL 300 mg daily for depression, Depakote 500 mg BID for improved mood stability, Remeron 15 mg at hs for insomnia/depression.  3-Coping skills for depression, anxiety, and substance dependency 4-Continue crisis stabilization and management 5-Address health issues--monitoring vital signs, stable 6-Treatment plan in progress to prevent relapse of depression, substance abuse, and anxiety  Medical Decision Making Problem Points:  Established problem, improving (1), Review of last therapy session (1) and Review of psycho-social stressors (1) Data Points:  Order Aims Assessment (2) Review or order clinical lab tests (1) Review of medication regiment & side effects (2) Review of new medications or change in dosage (2)  I certify that  inpatient services furnished can reasonably be expected to improve the patient's condition.   Elmarie Shiley, NP-C 04/10/2013, 12:56 PM

## 2013-04-10 NOTE — Progress Notes (Signed)
Agree with assessment and plan [place] Fails A. Jamayah Myszka, M.D. 

## 2013-04-10 NOTE — Progress Notes (Signed)
Adult Psychoeducational Group Note  Date:  04/10/2013 Time:  10:12 PM  Group Topic/Focus:  Wrap-Up Group:   The focus of this group is to help patients review their daily goal of treatment and discuss progress on daily workbooks.  Participation Level:  Active  Participation Quality:  Appropriate  Affect:  Appropriate  Cognitive:  Appropriate  Insight: Appropriate  Engagement in Group:  Engaged  Modes of Intervention:  Discussion  Additional Comments:  Patient stated that he had a very good and positive day. He says that he hopes to go home tomorrow. Patient stated that he enjoyed the fact that they got a chance to go outside and play ball. 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  Donney Rankins 04/10/2013, 10:12 PM

## 2013-04-10 NOTE — ED Provider Notes (Signed)
Medical screening examination/treatment/procedure(s) were performed by non-physician practitioner and as supervising physician I was immediately available for consultation/collaboration.   EKG Interpretation None        Blanchard Kelch, MD 04/10/13 [phone]

## 2013-04-10 NOTE — B[facility] Group Notes (Signed)
[facility] LCSW Group Therapy  04/10/2013 , 2:09 PM   Type of Therapy:  Group Therapy  Participation Level:  Active  Participation Quality:  Attentive  Affect:  Appropriate  Cognitive:  Alert  Insight:  Improving  Engagement in Therapy:  Engaged  Modes of Intervention:  Discussion, Exploration and Socialization  Summary of Progress/Problems: Today's group focused on the term Diagnosis.  Participants were asked to define the term, and then pronounce whether it is a negative, positive or neutral term.  Corey [name] was able to think abstractly about diagnosis, talking about how he has taken his car in to be diagnosed, and how he has self diagnosed in the past when feeling ill.  He also shared what is like to be an alcoholic, saying that running from the devil is a prescription for disaster.  "The devil has to be faced, and my higher power is more powerful than the devil."  Corey [name] 04/10/2013 , 2:09 PM

## 2013-04-10 NOTE — Progress Notes (Signed)
Patient ID: Corey [name], male   DOB: 1950-05-16, 63 y.o.   MRN: [id number]  D: Pt smiled upon approach. Was discussing his day with the Probation officer and older peer. The two discussed how good they felt after going outside and playing basketball and football. We began to discuss our parents and how we rubbed their muscles with [place] and complained about the smell. Pt easily engaged in conversation.  Stated that he's supposed to be discharged tomorrow, but became a little concerned when the writer wasn't able to confirm at the time of the assessment.   A:  Support and encouragement was offered. 15 min checks continued for safety.  R: Pt remains safe.

## 2013-04-10 NOTE — [facility] Group Notes (Signed)
[facility] Group Notes:  (Nursing/MHT/Case Management/Adjunct)  Date:  04/10/2013   Time: 0900 am  Type of Therapy:  Psychoeducational Skills  Participation Level:  Active  Participation Quality:  Appropriate and Attentive  Affect:  Appropriate  Cognitive:  Alert and Appropriate  Insight:  Appropriate  Engagement in Group:  Supportive  Modes of Intervention:  Support  Summary of Progress/Problems:  Corey [name] 04/10/2013, 1:24 PM

## 2013-04-11 NOTE — [facility] Group Notes (Signed)
[facility] [employer] Group Therapy  04/11/2013  1:24 PM  Type of Therapy:  Mental Health Association Presentation   Participation Level:  Minimal  Participation Quality:  Attentive  Affect:  Appropriate  Cognitive:  Appropriate  Insight:  Improving  Engagement in Therapy:  Engaged  Modes of Intervention:  Discussion, Education and Socialization   Summary of Progress/Problems:  [name] from [place] came to present his recovery story and play the guitar.  Corey [name] listened quietly while the speaker told his story.  He applauded after every song.  He told the speaker that he recognized one of the songs he played.  Closed his eyes and moved his fingers around while the speaker played his guitar.  He thanked the speaker for coming to group.    Corey [name]   04/11/2013  1:24 PM

## 2013-04-11 NOTE — Progress Notes (Signed)
Patient ID: Corey [name], male   DOB: 1950/07/29, 63 y.o.   MRN: [id number] [facility] MD Progress Note  04/11/2013 10:28 AM JERRE VANDRUNEN  MRN:  [id number] Subjective: '' My memory has improved since you weaned me off Clonazepam and Topamax.'' Objective: Patient  seen and chart is reviewed. Patient endorsed decreased agitation, labile mood, psychosis, racing thoughts, anxiety and depressive symptoms. He reports that he has been sleeping much better and has improved memory and no longer gets confused. He denies suicidal and homicidal  ideation, intent or plan. He states that he likes his current medication regimen and has not verbalized any adverse reactions. He has been attending group therapy and other unit milieu regularly.  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 Depressive Disorders:  Mood disorder Total Time spent with patient: 20 minutes  Axis I: Bipolar affective disorder, current episode manic with psychotic symptoms           History of Polysubstance abuse Axis II: Deferred Axis III:  Past Medical History  Diagnosis Date  . High cholesterol   . Iron deficiency anemia 12/09/2011  . Arthritis   . Tinnitus   . Migraine    Axis IV: other psychosocial or environmental problems and problems related to social environment  ADL's:  Intact  Sleep: Negative  Appetite:  Fair  Suicidal Ideation: denies  Homicidal Ideation: denies  AEB (as evidenced by):  Psychiatric Specialty Exam: Physical Exam  Psychiatric: His behavior is normal. Thought content normal. His mood appears anxious. His speech is rapid and/or pressured. He expresses impulsivity.    Review of Systems  Constitutional: Negative.   HENT: Negative.   Eyes: Negative.   Respiratory:  Negative.   Cardiovascular: Negative.   Gastrointestinal: Negative.   Genitourinary: Negative.   Musculoskeletal: Negative.   Skin: Negative.   Neurological: Negative.   Endo/Heme/Allergies: Negative.   Psychiatric/Behavioral: Positive for depression. The patient is nervous/anxious.     Blood pressure 129/73, pulse 97, temperature 97.7 F (36.5 C), temperature source Oral, resp. rate 18, height 5\' 10"  (1.778 m), weight 83.462 kg (184 lb).Body mass index is 26.4 kg/(m^2).  General Appearance: Fairly Groomed  Engineer, water::  Good  Speech:  Pressured  Volume:  Normal  Mood:  Anxious and Irritable  Affect:  Constricted  Thought Process:  Circumstantial  Orientation:  Only to  Place and Person  Thought Content:  Delusions and Hallucinations: Auditory  Suicidal Thoughts:  No  Homicidal Thoughts:  No  Memory:  Immediate;   Fair Recent;   Poor Remote;   Fair  Judgement:  Impaired  Insight:  Shallow  Psychomotor Activity:  Increased  Concentration:  Poor  Recall:  Poor  Fund of Knowledge:Fair  Language: Fair  Akathisia:  No  Handed:  Right  AIMS (if indicated):     Assets:  Communication Skills Desire for Improvement Physical Health Social Support  Sleep:  Number of Hours: 6.5   Musculoskeletal: Strength & Muscle Tone: within normal limits Gait & Station: normal Patient leans: N/A  Current Medications: Current Facility-Administered Medications  Medication Dose Route Frequency Provider Last Rate Last Dose  . acetaminophen (TYLENOL) tablet 650 mg  650 mg Oral Q4H PRN Delfin Gant, NP      . ARIPiprazole (ABILIFY) tablet 10 mg  10 mg Oral Daily Josephine C  Onuoha, NP   10 mg at 04/11/13 0742  . aspirin EC tablet 81 mg  81 mg Oral Daily Delfin Gant, NP   81 mg at 04/11/13 0742  . buPROPion (WELLBUTRIN XL) 24 hr tablet 300 mg  300 mg Oral BH-q7a Kaysi Ourada   300 mg at 04/11/13 0618  . divalproex (DEPAKOTE ER) 24 hr tablet 500 mg  500 mg Oral BID PC Kaenan Jake   500 mg at 04/11/13 0744  . ibuprofen (ADVIL,MOTRIN) tablet 600 mg  600 mg Oral Q8H PRN Delfin Gant, NP   600 mg at 04/08/13 2119  . magnesium hydroxide (MILK OF MAGNESIA) suspension 30 mL  30 mL Oral Daily PRN Nereida Schepp   30 mL at 04/08/13 1207  . mirtazapine (REMERON) tablet 15 mg  15 mg Oral QHS Tytan Sandate   15 mg at 04/10/13 2144  . ondansetron (ZOFRAN) tablet 4 mg  4 mg Oral Q8H PRN Delfin Gant, NP      . pantoprazole (PROTONIX) EC tablet 40 mg  40 mg Oral Daily Delfin Gant, NP   40 mg at 04/11/13 0815  . simvastatin (ZOCOR) tablet 40 mg  40 mg Oral QPC supper Delfin Gant, NP   40 mg at 04/10/13 1701  . testosterone (ANDROGEL) gel 5 g  5 g Transdermal Daily Delfin Gant, NP   5 g at 04/11/13 3762    Lab Results:  No results found for this or any previous visit (from the past 48 hour(s)).  Physical Findings: AIMS: Facial and Oral Movements Muscles of Facial Expression: None, normal Lips and Perioral Area: None, normal Jaw: None, normal Tongue: None, normal,Extremity Movements Upper (arms, wrists, hands, fingers): None, normal Lower (legs, knees, ankles, toes): None, normal, Trunk Movements Neck, shoulders, hips: None, normal, Overall Severity Severity of abnormal movements (highest score from questions above): None, normal Incapacitation due to abnormal movements: None, normal Patient's awareness of abnormal movements (rate only patient's report): No Awareness, Dental Status Current problems with teeth and/or dentures?: No Does patient usually wear dentures?: No  CIWA:    COWS:     Treatment Plan Summary: Daily contact with patient to assess and evaluate symptoms and progress in treatment Medication management  Plan:1. Admit for crisis management and stabilization. 2. Medication management to reduce current symptoms to base line and improve the patient's overall level of functioning: -Continue Aripiprazole 10mg  daily for  delusions/psychosis. -Continue Depakote 500mg  bid for migraine and mood stabilization. -Continue Wellbutrin XL 300mg  daily for depression. -Continue Remeron 15mg  Qhs FOR insomnia -Discontinue Topamax and Clonazepam due to poor memory. 3. Treat health problems as indicated. 4. Develop treatment plan to decrease risk of relapse upon discharge and the need for  readmission. 5. Psycho-social education regarding relapse prevention and self care. 6. Health care follow up as needed for medical problems. 7. Valproic acid level on 04/12/13    Medical Decision Making Problem Points:  Established problem, improving (1), Review of last therapy session (1) and Review of psycho-social stressors (1) Data Points:  Order Aims Assessment (2) Review or order clinical lab tests (1) Review of medication regiment & side effects (2) Review of new medications or change in dosage (2)  I certify that inpatient services furnished can reasonably be expected to improve the patient's condition.   Corena Pilgrim, MD 04/11/2013, 10:28 AM

## 2013-04-11 NOTE — Progress Notes (Signed)
Adult Psychoeducational Group Note  Date:  04/11/2013 Time:  10:05 PM  Group Topic/Focus:  Wrap-Up Group:   The focus of this group is to help patients review their daily goal of treatment and discuss progress on daily workbooks.  Participation Level:  Active  Participation Quality:  Appropriate  Affect:  Appropriate  Cognitive:  Alert  Insight: Appropriate  Engagement in Group:  Engaged  Modes of Intervention:  Discussion  Additional Comments:  Pt stated that he had a good day. He likes that he is surrounded by good people here and that makes it easier for him. He enjoyed the mental health association group this afternoon.  However, he did have issues with sleeping last night.  Wynelle Fanny R 04/11/2013, 10:05 PM

## 2013-04-11 NOTE — Progress Notes (Signed)
Patient ID: Corey [name], male   DOB: [birth year], 63 y.o.   MRN: [id number] D: patient presents with brighter affect today.  He states that he is eager for discharge and the plan is for Friday.  He denies any SI/HI/AVH.  Patient is attending groups and interacting well with staff and peers.  Patient is vested in his treatment; he feels he is doing well on his current medications.  A: continue to monitor medication management and MD orders.  Safety checks continued every 15 minutes per protocol.  R: patient is receptive to staff and his behavior is appropriate.

## 2013-04-11 NOTE — BHH Group Notes (Signed)
[facility] LCSW Aftercare Discharge Planning Group Note   04/11/2013 11:16 AM  Participation Quality:  Engaged  Mood/Affect:  Appropriate  Depression Rating:  denies  Anxiety Rating:  denies  Thoughts of Suicide:  No Will you contract for safety?   NA  Current AVH:  No  Plan for Discharge/Comments:  Corey [name] is eating well, sleeping well.  He misses his family and his 5 dogs.  He is ready to leave.  Told him he would see the Dr today and find out LOS.  Transportation Means:  family  Supports: family  [place], Corey [name]

## 2013-04-11 NOTE — BHH Group Notes (Signed)
Adult Psychoeducational Group Note  Date:  04/11/2013 Time:  1:45 PM  Group Topic/Focus:  Managing Feelings:   The focus of this group is to identify what feelings patients have difficulty handling and develop a plan to handle them in a healthier way upon discharge.  Participation Level:  Active  Participation Quality:  Appropriate  Affect:  Appropriate  Cognitive:  Appropriate  Insight: Good  Engagement in Group:  Engaged and Supportive  Modes of Intervention:  Discussion, Education and Support  Additional Comments:   Onita Pfluger P 04/11/2013, 1:45 PM

## 2013-04-12 LAB — VALPROIC ACID LEVEL: Valproic Acid Lvl: 61.6 ug/mL (ref 50.0–100.0)

## 2013-04-12 NOTE — Progress Notes (Signed)
[facility] Adult Case Management Discharge Plan :  Will you be returning to the same living situation after discharge: Yes,  home At discharge, do you have transportation home?:Yes,  family Do you have the ability to pay for your medications:Yes,  insurance  Release of information consent forms completed and in the chart;  Patient's signature needed at discharge.  Patient to Follow up at: Follow-up Information   Follow up with [facility]. (Monday, March 23rd at 3:45 with Dr Casimiro Needle.  Tuesday March 24th at 4:00 with Ms Veverly Fells.  Monday the 30th at 5:00 with Ms Veverly Fells)    Contact information:   [place] [place] [phone]      Patient denies SI/HI:   Yes,  yes    Safety Planning and Suicide Prevention discussed:  Yes,  yes  [name] 04/12/2013, 10:11 AM

## 2013-04-12 NOTE — BHH Group Notes (Signed)
[facility] Group Notes:  (Counselor/Nursing/MHT/Case Management/Adjunct)  04/12/2013 1:15PM  Type of Therapy:  Group Therapy  Participation Level:  Active  Participation Quality:  Appropriate  Affect:  Flat  Cognitive:  Oriented  Insight:  Improving  Engagement in Group:  Limited  Engagement in Therapy:  Limited  Modes of Intervention:  Discussion, Exploration and Socialization  Summary of Progress/Problems: The topic for group was balance in life.  Pt participated in the discussion about when their life was in balance and out of balance and how this feels.  Pt discussed ways to get back in balance and short term goals they can work on to get where they want to be. "I've been feeling unbalanced because I was short tempered with my wife and my dogs.  That's not like me.  I wonder if it had to do with the change in my meds from xanax to zoloft."  It was clear that Havish is remorseful about this situation, and is wanting to move forward in a different direction.  He also gave feedback to others about cutting out negative relationships, and it was helpful coming from the perspective of an alcoholic.   Roque Lias B 04/12/2013 1:38 PM

## 2013-04-12 NOTE — Progress Notes (Signed)
Patient ID: Corey [name], male   DOB: [birth year], 63 y.o.   MRN: [id number] [facility] MD Progress Note  04/12/2013 10:56 AM COSTELLO WINKOWSKI  MRN:  [id number] Subjective: Patient states "I am doing better since being taken off Klonopin. I ran out and I think that affected my mind. I'm looking forward to going home tomorrow."   Objective: Patient  seen and chart is reviewed. Patient endorsed decreased agitation, labile mood, psychosis, racing thoughts, anxiety and depressive symptoms. He reports trouble sleeping but feels it may be due to the hospital environment. Patient feels that he is getting good support from his family as they are visiting regularly. Corey [name] feels that his mood has stabilized on his medications. He reports feeling ready for discharge tomorrow. Patient is looking forward to seeing his pets and family members. He is compliant with his scheduled medications and is actively attending groups. Patient reports feeling relieved that he is no longer experiencing psychotic symptoms. Corey [name] plans to continue on his medications and follow up as directed.   Diagnosis:   DSM5: Schizophrenia Disorders:   Obsessive-Compulsive Disorders:   Trauma-Stressor Disorders:   Substance/Addictive Disorders:  Past history of polysubstance abuse, has been clean for over 15 years. Depressive Disorders:  Mood disorder Total Time spent with patient: 15 minutes  Axis I: Bipolar affective disorder, current episode manic with psychotic symptoms           History of Polysubstance abuse Axis II: Deferred Axis III:  Past Medical History  Diagnosis Date  . High cholesterol   . Iron deficiency anemia 12/09/2011  . Arthritis   . Tinnitus   . Migraine    Axis IV: other psychosocial or environmental problems and problems related to social environment  ADL's:  Intact  Sleep: Fair  Appetite:  Fair  Suicidal Ideation: denies  Homicidal Ideation: denies  AEB (as evidenced by):  Psychiatric Specialty  Exam: Physical Exam  Psychiatric: His behavior is normal. Thought content normal. His mood appears anxious. His speech is rapid and/or pressured. He expresses impulsivity.    Review of Systems  Constitutional: Negative.   HENT: Negative.   Eyes: Negative.   Respiratory: Negative.   Cardiovascular: Negative.   Gastrointestinal: Negative.   Genitourinary: Negative.   Musculoskeletal: Negative.   Skin: Negative.   Neurological: Negative.   Endo/Heme/Allergies: Negative.   Psychiatric/Behavioral: Negative for depression, suicidal ideas, hallucinations, memory loss and substance abuse. The patient is nervous/anxious and has insomnia.     Blood pressure 97/66, pulse 87, temperature 97.4 F (36.3 C), temperature source Oral, resp. rate 16, height 5\' 10"  (1.778 m), weight 83.462 kg (184 lb).Body mass index is 26.4 kg/(m^2).  General Appearance: Well groomed   Eye Contact::  Good  Speech:  Clear and Coherent  Volume:  Normal  Mood:  Anxious  Affect:  Full Range  Thought Process:  Circumstantial  Orientation: Alert and oriented time three  Thought Content:  WDL  Suicidal Thoughts:  No  Homicidal Thoughts:  No  Memory:  Immediate;   Fair Recent;   Fair Remote;   Fair  Judgement:  Fair  Insight:  Shallow  Psychomotor Activity:  Normal  Concentration:  Poor  Recall:  Poor  Fund of Knowledge:Fair  Language: Fair  Akathisia:  No  Handed:  Right  AIMS (if indicated):     Assets:  Communication Skills Desire for Improvement Physical Health Social Support  Sleep:  Number of Hours: 6.5   Musculoskeletal: Strength & Muscle Tone: within normal limits  Gait & Station: normal Patient leans: N/A  Current Medications: Current Facility-Administered Medications  Medication Dose Route Frequency Provider Last Rate Last Dose  . acetaminophen (TYLENOL) tablet 650 mg  650 mg Oral Q4H PRN Delfin Gant, NP      . ARIPiprazole (ABILIFY) tablet 10 mg  10 mg Oral Daily Delfin Gant,  NP   10 mg at 04/12/13 0744  . aspirin EC tablet 81 mg  81 mg Oral Daily Delfin Gant, NP   81 mg at 04/12/13 0744  . buPROPion (WELLBUTRIN XL) 24 hr tablet 300 mg  300 mg Oral BH-q7a Farren Landa   300 mg at 04/12/13 8546  . divalproex (DEPAKOTE ER) 24 hr tablet 500 mg  500 mg Oral BID PC Lutricia Widjaja   500 mg at 04/12/13 0744  . ibuprofen (ADVIL,MOTRIN) tablet 600 mg  600 mg Oral Q8H PRN Delfin Gant, NP   600 mg at 04/08/13 2119  . magnesium hydroxide (MILK OF MAGNESIA) suspension 30 mL  30 mL Oral Daily PRN Tannia Contino   30 mL at 04/08/13 1207  . mirtazapine (REMERON) tablet 15 mg  15 mg Oral QHS Lakoda Mcanany   15 mg at 04/11/13 2129  . ondansetron (ZOFRAN) tablet 4 mg  4 mg Oral Q8H PRN Delfin Gant, NP      . pantoprazole (PROTONIX) EC tablet 40 mg  40 mg Oral Daily Delfin Gant, NP   40 mg at 04/12/13 0744  . simvastatin (ZOCOR) tablet 40 mg  40 mg Oral QPC supper Delfin Gant, NP   40 mg at 04/11/13 1648  . testosterone (ANDROGEL) gel 5 g  5 g Transdermal Daily Delfin Gant, NP   5 g at 04/12/13 2703    Lab Results:  Results for orders placed during the hospital encounter of 04/06/13 (from the past 48 hour(s))  VALPROIC ACID LEVEL     Status: None   Collection Time    04/12/13  6:15 AM      Result Value Ref Range   Valproic Acid Lvl 61.6  50.0 - 100.0 ug/mL   Comment: Performed at [facility]    Physical Findings: AIMS: Facial and Oral Movements Muscles of Facial Expression: None, normal Lips and Perioral Area: None, normal Jaw: None, normal Tongue: None, normal,Extremity Movements Upper (arms, wrists, hands, fingers): None, normal Lower (legs, knees, ankles, toes): None, normal, Trunk Movements Neck, shoulders, hips: None, normal, Overall Severity Severity of abnormal movements (highest score from questions above): None, normal Incapacitation due to abnormal movements: None, normal Patient's awareness of abnormal  movements (rate only patient's report): No Awareness, Dental Status Current problems with teeth and/or dentures?: No Does patient usually wear dentures?: No  CIWA:    COWS:     Treatment Plan Summary: Daily contact with patient to assess and evaluate symptoms and progress in treatment Medication management  Plan:1. Continue crisis management and stabilization. 2. Medication management to reduce current symptoms to base line and improve the patient's overall level of functioning: -Continue Aripiprazole 10mg  daily for delusions/psychosis. -Continue Depakote 500mg  bid for migraine and mood stabilization. -Continue Wellbutrin XL 300mg  daily for depression. -Continue Remeron 15mg  Qhs for insomnia 3. Treat health problems as indicated. 4. Develop treatment plan to decrease risk of relapse upon discharge and the need for readmission. 5. Psycho-social education regarding relapse prevention and self care. 6. Health care follow up as needed for medical problems. 7. Valproic acid level drawn in am of 04/12/13  is 61.6.  8. Anticipate discharge home tomorrow.   Medical Decision Making Problem Points:  Established problem, improving (1), Review of last therapy session (1) and Review of psycho-social stressors (1) Data Points:  Order Aims Assessment (2) Review or order clinical lab tests (1) Review of medication regiment & side effects (2) Review of new medications or change in dosage (2)  I certify that inpatient services furnished can reasonably be expected to improve the patient's condition.   Elmarie Shiley, NP-C 04/12/2013, 10:56 AM Patient seen, evaluated and I agree with notes by Nurse Practitioner. Corena Pilgrim, MD

## 2013-04-12 NOTE — Progress Notes (Signed)
D:  Per pt self inventory pt reports sleeping fair to poor, appetite good, energy level normal, ability to pay attention good, rates depression at a 1 out of 10 and hopelessness at a 1 out of 10, pt pleasant, flat but appropriate during interaction, denies SI/HI/AVH, calm and cooperative, only c/o difficulty sleeping at night.      A:  Emotional support provided, Encouraged pt to continue with treatment plan and attend all group activities, q15 min checks maintained for safety.  R:  Pt is receptive, vested in treatment, going to groups, shows insight about coping skills for anxiety and is enthusiastic about getting back into his AA meetings to help with staying healthy after he is discharged.

## 2013-04-12 NOTE — Tx Team (Signed)
  Interdisciplinary Treatment Plan Update   Date Reviewed:  04/12/2013  Time Reviewed:  10:07 AM  Progress in Treatment:   Attending groups: Yes Participating in groups: Yes Taking medication as prescribed: Yes  Tolerating medication: Yes Family/Significant other contact made: Yes  Patient understands diagnosis: Yes  Discussing patient identified problems/goals with staff: Yes Medical problems stabilized or resolved: Yes Denies suicidal/homicidal ideation: Yes Patient has not harmed self or others: Yes  For review of initial/current patient goals, please see plan of care.  Estimated Length of Stay:  Likely d/c tomorrow  Reason for Continuation of Hospitalization:   New Problems/Goals identified:  N/A  Discharge Plan or Barriers:   return home, follow up outpt  Additional Comments:  Attendees:  Signature: Corena Pilgrim, MD 04/12/2013 10:07 AM   Signature: Ripley Fraise, LCSW 04/12/2013 10:07 AM  Signature: Elmarie Shiley, NP 04/12/2013 10:07 AM  Signature: Mayra Neer, RN 04/12/2013 10:07 AM  Signature: Darrol Angel, RN 04/12/2013 10:07 AM  Signature:  04/12/2013 10:07 AM  Signature:   04/12/2013 10:07 AM  Signature:    Signature:    Signature:    Signature:    Signature:    Signature:      Scribe for Treatment Team:   Ripley Fraise, LCSW  04/12/2013 10:07 AM

## 2013-04-12 NOTE — BHH Group Notes (Signed)
Adult Psychoeducational Group Note  Date:  04/12/2013 Time:  0900 am  Group Topic/Focus:  Orientation:   The focus of this group is to educate the patient on the purpose and policies of crisis stabilization and provide a format to answer questions about their admission.  The group details unit policies and expectations of patients while admitted.  Participation Level:  Active  Participation Quality:  Appropriate and Attentive  Affect:  Appropriate  Cognitive:  Alert and Appropriate  Insight: Good  Engagement in Group:  Engaged and Supportive  Modes of Intervention:  Discussion, Education, Orientation and Support  Additional Comments:  Pt was engaged in group, participated appropriately, discussed coping skills such as deep breathing to help with anxiety.  Wynn Banker 04/12/2013, 1:03 PM

## 2013-04-12 NOTE — BHH Suicide Risk Assessment (Signed)
[place] INPATIENT:  Family/Significant Other Suicide Prevention Education  Suicide Prevention Education:  Education Completed; No one has been identified by the patient as the family member/significant other with whom the patient will be residing, and identified as the person(s) who will aid the patient in the event of a mental health crisis (suicidal ideations/suicide attempt).  With written consent from the patient, the family member/significant other has been provided the following suicide prevention education, prior to the and/or following the discharge of the patient.  The suicide prevention education provided includes the following:  Suicide risk factors  Suicide prevention and interventions  National Suicide Hotline telephone number  [facility] assessment telephone number  [facility] Emergency Assistance [place] and/or Residential Mobile Crisis Unit telephone number  Request made of family/significant other to:  Remove weapons (e.g., guns, rifles, knives), all items previously/currently identified as safety concern.    Remove drugs/medications (over-the-counter, prescriptions, illicit drugs), all items previously/currently identified as a safety concern.  The family member/significant other verbalizes understanding of the suicide prevention education information provided.  The family member/significant other agrees to remove the items of safety concern listed above. The patient did not endorse SI at the time of admission, nor did the patient c/o SI during the stay here.  SPE not required.  I spoke with pt's wife who assured me she had no concerns about pt's safety, but recently has been concerned about hers as pt's mood has been volatile.  She believes his mood is more stable since the beginning of hospitalization.  Roque Lias B 04/12/2013, 10:37 AM

## 2013-04-12 NOTE — Progress Notes (Signed)
Patient ID: Corey [name], male   DOB: 1950/08/26, 63 y.o.   MRN: [id number] D: Patient in dayroom on approach. Pt mood/affect is appropriate to situation. Pt reports plan to follow up with provider and takes medication as prescribed. Pt denies SI/HI/AVH and pain. Pt observed on unit engaging with peers. Pt denies any needs or concerns.  Cooperative with assessment. No acute distressed noted at this time.   A: Met with pt 1:1. Medications administered as prescribed. Writer encouraged pt to discuss feelings. Pt encouraged to come to staff with any question or concerns.   R: Patient remains safe. He is complaint with medications and denies any adverse reaction. Continue current POC.

## 2013-04-13 MED ORDER — PANTOPRAZOLE SODIUM 40 MG PO TBEC: 40.0000 mg | DELAYED_RELEASE_TABLET | Freq: Every day | ORAL | Status: DC

## 2013-04-13 MED ORDER — NIACIN 500 MG PO TABS: 1000.0000 mg | ORAL_TABLET | ORAL | Status: DC

## 2013-04-13 MED ORDER — VITAMIN C 500 MG PO CHEW: 1.0000 | CHEWABLE_TABLET | Freq: Every morning | ORAL | Status: DC

## 2013-04-13 MED ORDER — TESTOSTERONE 30 MG/ACT TD SOLN: 1.0000 "application " | Freq: Every day | TRANSDERMAL | Status: DC

## 2013-04-13 MED ORDER — MIRTAZAPINE 15 MG PO TABS: 15.0000 mg | ORAL_TABLET | Freq: Every day | ORAL | Status: DC

## 2013-04-13 MED ORDER — SIMVASTATIN 40 MG PO TABS: 40.0000 mg | ORAL_TABLET | Freq: Every day | ORAL | Status: DC

## 2013-04-13 MED ORDER — ARIPIPRAZOLE 10 MG PO TABS: 10.0000 mg | ORAL_TABLET | Freq: Every day | ORAL | Status: DC

## 2013-04-13 MED ORDER — ASPIRIN EC 81 MG PO TBEC: 81.0000 mg | DELAYED_RELEASE_TABLET | Freq: Every day | ORAL | Status: AC

## 2013-04-13 MED ORDER — BUPROPION HCL ER (XL) 300 MG PO TB24: 300.0000 mg | ORAL_TABLET | ORAL | Status: DC

## 2013-04-13 MED ORDER — DIVALPROEX SODIUM ER 500 MG PO TB24: 500.0000 mg | ORAL_TABLET | Freq: Two times a day (BID) | ORAL | Status: DC

## 2013-04-13 MED ORDER — ADULT MULTIVITAMIN W/MINERALS CH: 1.0000 | ORAL_TABLET | Freq: Every day | ORAL | Status: DC

## 2013-04-13 NOTE — Discharge Summary (Signed)
Physician Discharge Summary Note  Patient:  Corey [name] is an 63 y.o., male MRN:  [id number] DOB:  12-12-1950 Patient phone:  [phone] (home)  Patient address:   [place] [address],  Total Time spent with patient: 20 minutes  Date of Admission:  04/06/2013 Date of Discharge: 04/13/13  Reason for Admission: Mood lability, Psychosis   Discharge Diagnoses: Principal Problem:   Bipolar affective disorder, current episode manic with psychotic symptoms   Psychiatric Specialty Exam: Physical Exam  Psychiatric: He has a normal mood and affect. His speech is normal and behavior is normal. Judgment and thought content normal. Cognition and memory are normal.    Review of Systems  Constitutional: Negative.   HENT: Negative.   Eyes: Negative.   Respiratory: Negative.   Cardiovascular: Negative.   Gastrointestinal: Negative.   Genitourinary: Negative.   Musculoskeletal: Negative.   Skin: Negative.   Neurological: Negative.   Endo/Heme/Allergies: Negative.   Psychiatric/Behavioral: Negative for depression, suicidal ideas, hallucinations, memory loss and substance abuse. The patient is not nervous/anxious and does not have insomnia.     Blood pressure 112/75, pulse 97, temperature 97.8 F (36.6 C), temperature source Oral, resp. rate 18, height 5\' 10"  (1.778 m), weight 83.462 kg (184 lb).Body mass index is 26.4 kg/(m^2).  General Appearance: Fairly Groomed  Engineer, water::  Good  Speech:  Clear and Coherent  Volume:  Normal  Mood:  Euthymic  Affect:  Appropriate  Thought Process:  Goal Directed  Orientation:  Full (Time, Place, and Person)  Thought Content:  Negative  Suicidal Thoughts:  No  Homicidal Thoughts:  No  Memory:  Immediate;   Fair Recent;   Fair Remote;   Fair  Judgement:  Fair  Insight:  Fair  Psychomotor Activity:  Normal  Concentration:  Fair  Recall:  [employer] of Knowledge:Good  Language: Fair  Akathisia:  No  Handed:  Right  AIMS  (if indicated):     Assets:  Communication Skills Desire for Improvement Physical Health  Sleep:  Number of Hours: 6.75    Past Psychiatric History: Diagnosis: Bipolar Disorder   [facility]ospitalizations: [facility]  Outpatient Care: [facility]  Substance Abuse Care: Denies  Self-Mutilation: Denies  Suicidal Attempts: Denies  Violent Behaviors: Denies   Musculoskeletal: Strength & Muscle Tone: within normal limits Gait & Station: normal Patient leans: N/A  DSM5: AXIS I: Bipolar affective disorder, current episode manic with psychotic symptoms  AXIS II: Deferred  AXIS III:  Past Medical History   Diagnosis  Date   .  High cholesterol    .  Iron deficiency anemia  12/09/2011   .  Depression    .  Arthritis    .  Tinnitus    .  Migraine    .  Bipolar disorder    .  Anxiety     AXIS IV: other psychosocial or environmental problems and problems related to social environment  AXIS V: 61-70 mild symptoms  Level of Care:  OP  Hospital Course:  Patient is a 63 year old man who has poor memory and unable to recall most of the past and presents event. History is obtained form him and the charts. Patient reports prior history of Bipolar depression, generalized anxiety disorder , major depressive disorder, chronic migraine and iron deficiency anemia. Per records, history obtained through patient and his wife revealed that patient has had unusual behavior changes with mood swings, slurring of speech and gait, anger issues along with auditory and visual hallucinations  ongoing for more than a month. Patient reports he hears sounds, and often see human figures to the side of his peripheral view usually at night. His wife also reports that he has been getting angry easily and could be confrontational. This is not normal for him. Denies any thought of hurting himself. His wife believes his change in behavior since likely secondary to the many medications that he is currently taking, specifically Topamax.  States that he has been taking that for his headache in the past it has caused him to be behave strangely, he did stop medication for a period of time which has helped with his mental status, however he is back on the medication and again she noticed a change in his personality. He has history of chronic back pain and does take pain medication and muscle relaxant. His wife believes taking too many medication has affecting his health negatively. At this time patient denies any acute pain, denies any headache, change in his vision, chest and abdominal pain. Still endorsed chronic back pain. He continues to endorse anxiety, excessive worries, racing thoughts and depressive symptoms. He denies homicidal ideation, intent or plan.  Patient was admitted to the 400 hall for further assessment and medication management. The patient was taken off Topamax and Klonopin as patient felt they were causing cognitive impairment. He was started on Abilify 10 mg daily for psychosis and mood control. Patient was started on Remeron 15 mg at hs for depression and insomnia. His Wellbutrin XL was continued at 300 mg daily along with Depakote 500 mg BID. Deniel was active on the unit and attended the scheduled groups. Patient interacted well with peers. The patient felt that his symptoms of psychosis could have been caused by running out of Klonopin per his report. Ashkan denied that he experienced any hallucinations during his hospital stay. His family was very supportive of his recovery. Patient reported decreased agitation, labile mood, racing thoughts, anxiety and depressive symptoms. Patient reported having good support from his family especially his wife. His wife came to visit him during his admission. Garlan was compliant with his medication and denied any adverse effects. Patient was found stable to discharge by the treatment team. He was provided with prescriptions and medication samples.   Consults:  psychiatry  Significant  Diagnostic Studies:  Admission labs reviewed and completed   Discharge Vitals:   Blood pressure 112/75, pulse 97, temperature 97.8 F (36.6 C), temperature source Oral, resp. rate 18, height 5\' 10"  (1.778 m), weight 83.462 kg (184 lb). Body mass index is 26.4 kg/(m^2). Lab Results:   Results for orders placed during the hospital encounter of 04/06/13 (from the past 72 hour(s))  VALPROIC ACID LEVEL     Status: None   Collection Time    04/12/13  6:15 AM      Result Value Ref Range   Valproic Acid Lvl 61.6  50.0 - 100.0 ug/mL   Comment: Performed at [facility]    Physical Findings: AIMS: Facial and Oral Movements Muscles of Facial Expression: None, normal Lips and Perioral Area: None, normal Jaw: None, normal Tongue: None, normal,Extremity Movements Upper (arms, wrists, hands, fingers): None, normal Lower (legs, knees, ankles, toes): None, normal, Trunk Movements Neck, shoulders, hips: None, normal, Overall Severity Severity of abnormal movements (highest score from questions above): None, normal Incapacitation due to abnormal movements: None, normal Patient's awareness of abnormal movements (rate only patient's report): No Awareness, Dental Status Current problems with teeth and/or dentures?: No Does patient usually  wear dentures?: No  CIWA:    COWS:     Psychiatric Specialty Exam: See Psychiatric Specialty Exam and Suicide Risk Assessment completed by Attending Physician prior to discharge.  Discharge destination:  Home  Is patient on multiple antipsychotic therapies at discharge:  No   Has Patient had three or more failed trials of antipsychotic monotherapy by history:  No  Recommended Plan for Multiple Antipsychotic Therapies: NA     Medication List    STOP taking these medications       clonazePAM 0.5 MG tablet  Commonly known as:  KLONOPIN     topiramate 100 MG tablet  Commonly known as:  TOPAMAX      TAKE these medications     Indication    ARIPiprazole 10 MG tablet  Commonly known as:  ABILIFY  Take 1 tablet (10 mg total) by mouth daily.   Indication:  Manic Phase of Manic-Depression     aspirin EC 81 MG tablet  Take 1 tablet (81 mg total) by mouth daily.   Indication:  Joint Damage causing Pain and Loss of Function     BOTOX IJ  Inject as directed. Every 3 to 4 months for migraines.      buPROPion 300 MG 24 hr tablet  Commonly known as:  WELLBUTRIN XL  Take 1 tablet (300 mg total) by mouth every morning.   Indication:  Depressive Phase of Manic-Depression     divalproex 500 MG 24 hr tablet  Commonly known as:  DEPAKOTE ER  Take 1 tablet (500 mg total) by mouth 2 (two) times daily after a meal.   Indication:  Migraine Headache, Rapidly Alternating Manic-Depressive Psychosis     fluticasone 27.5 MCG/SPRAY nasal spray  Commonly known as:  VERAMYST  Place 2 sprays into the nose daily as needed for rhinitis or allergies.      mirtazapine 15 MG tablet  Commonly known as:  REMERON  Take 1 tablet (15 mg total) by mouth at bedtime.   Indication:  Trouble Sleeping, Treatment to Prevent Migraine Headaches     multivitamin with minerals Tabs tablet  Take 1 tablet by mouth daily. Centrum Silver   Indication:  Vitamin Supplementation     naphazoline-glycerin 0.012-0.2 % Soln  Commonly known as:  CLEAR EYES  Place 2 drops into both eyes daily as needed for irritation.      niacin 500 MG tablet  Take 2 tablets (1,000 mg total) by mouth every morning.   Indication:  Deficiency of the Vitamin Niacin     pantoprazole 40 MG tablet  Commonly known as:  PROTONIX  Take 1 tablet (40 mg total) by mouth daily.   Indication:  Gastroesophageal Reflux Disease     simvastatin 40 MG tablet  Commonly known as:  ZOCOR  Take 1 tablet (40 mg total) by mouth daily.   Indication:  Heart Attack     Testosterone 30 MG/ACT Soln  Commonly known as:  AXIRON  Apply 1 application topically daily.   Indication:  Deficient Activity of  Testis or Ovary     Vitamin C 500 MG Chew  Chew 1 tablet (500 mg total) by mouth every morning. One to three times per day.   Indication:  Disease involving Cholesterol Deposits in the Arteries           Follow-up Information   Follow up with [facility]. (Monday, March 23rd at 3:45 with Dr Casimiro Needle.  Tuesday March 24th at 4:00 with Ms Veverly Fells.  Monday  the 30th at 5:00 with Ms Veverly Fells)    Contact information:   [place] [phone]      Follow-up recommendations:   Activity: as tolerated  Diet: healthy  Tests: Valproic acid: 61.6  Other: patient to keep his after care appointment  Comments:  Take all your medications as prescribed by your mental healthcare provider.  Report any adverse effects and or reactions from your medicines to your outpatient provider promptly.  Patient is instructed and cautioned to not engage in alcohol and or illegal drug use while on prescription medicines.  In the event of worsening symptoms, patient is instructed to call the crisis hotline, 911 and or go to the nearest ED for appropriate evaluation and treatment of symptoms.  Follow-up with your primary care provider for your other medical issues, concerns and or health care needs.   Total Discharge Time:  Greater than 30 minutes.  SignedElmarie Shiley NP-C 04/13/2013, 9:30 AM Patient seen, evaluated and I agree with notes by Nurse Practitioner. Corena Pilgrim, MD

## 2013-04-13 NOTE — Progress Notes (Signed)
Patient ID: Corey [name], male   DOB: 07/08/1950, 63 y.o.   MRN: [id number] Patient denies si/hi/avh. Pt verbalizes understanding of discharge instructions, prescriptions and follow up appts.he also got sample medications.  Pt signed for their belongings from locker. Pt was walked to the lobby and was transported by his wife named pam.

## 2013-04-13 NOTE — [facility] Suicide Risk Assessment (Signed)
   Demographic Factors:  Male, Caucasian and lives with his wife  Total Time spent with patient: 20 minutes  Psychiatric Specialty Exam: Physical Exam  Psychiatric: He has a normal mood and affect. His speech is normal and behavior is normal. Judgment and thought content normal. Cognition and memory are normal.    Review of Systems  Constitutional: Negative.   HENT: Negative.   Eyes: Negative.   Respiratory: Negative.   Cardiovascular: Negative.   Gastrointestinal: Negative.   Genitourinary: Negative.   Musculoskeletal: Negative.   Skin: Negative.   Neurological: Negative.   Endo/Heme/Allergies: Negative.   Psychiatric/Behavioral: Negative.     Blood pressure 112/75, pulse 97, temperature 97.8 F (36.6 C), temperature source Oral, resp. rate 18, height 5\' 10"  (1.778 m), weight 83.462 kg (184 lb).Body mass index is 26.4 kg/(m^2).  General Appearance: Fairly Groomed  Engineer, water::  Good  Speech:  Clear and Coherent  Volume:  Normal  Mood:  Euthymic  Affect:  Appropriate  Thought Process:  Goal Directed  Orientation:  Full (Time, Place, and Person)  Thought Content:  Negative  Suicidal Thoughts:  No  Homicidal Thoughts:  No  Memory:  Immediate;   Fair Recent;   Fair Remote;   Fair  Judgement:  Fair  Insight:  Fair  Psychomotor Activity:  Normal  Concentration:  Fair  Recall:  [employer] of Knowledge:Good  Language: Fair  Akathisia:  No  Handed:  Right  AIMS (if indicated):     Assets:  Communication Skills Desire for Improvement Physical Health  Sleep:  Number of Hours: 6.75    Musculoskeletal: Strength & Muscle Tone: within normal limits Gait & Station: normal Patient leans: N/A   Mental Status Per Nursing Assessment::   On Admission:     Current Mental Status by Physician: patient denies suicidal ideation, intent or plan  Loss Factors: Financial problems/change in socioeconomic status  Historical Factors: NA  Risk Reduction Factors:   Sense of  responsibility to family, Living with another person, especially a relative, Positive social support and Positive therapeutic relationship  Continued Clinical Symptoms:  Resolving memory problem and mood symptoms  Cognitive Features That Contribute To Risk:  Closed-mindedness    Suicide Risk:  Minimal: No identifiable suicidal ideation.  Patients presenting with no risk factors but with morbid ruminations; may be classified as minimal risk based on the severity of the depressive symptoms  Discharge Diagnoses:   AXIS I:  Bipolar affective disorder, current episode manic with psychotic symptoms  AXIS II:  Deferred AXIS III:   Past Medical History  Diagnosis Date  . High cholesterol   . Iron deficiency anemia 12/09/2011  . Depression   . Arthritis   . Tinnitus   . Migraine   . Bipolar disorder   . Anxiety    AXIS IV:  other psychosocial or environmental problems and problems related to social environment AXIS V:  61-70 mild symptoms  Plan Of Care/Follow-up recommendations:  Activity:  as tolerated Diet:  healthy Tests:  Valproic acid: 61.6 Other:  patient to keep his after care appointment  Is patient on multiple antipsychotic therapies at discharge:  No   Has Patient had three or more failed trials of antipsychotic monotherapy by history:  No  Recommended Plan for Multiple Antipsychotic Therapies: NA    Corena Pilgrim, MD 04/13/2013, 9:21 AM

## 2013-04-16 ENCOUNTER — Other Ambulatory Visit: Payer: Self-pay | Admitting: Neurology

## 2013-04-16 ENCOUNTER — Other Ambulatory Visit (HOSPITAL_COMMUNITY): Payer: Self-pay | Admitting: Nurse Practitioner

## 2013-04-16 DIAGNOSIS — R413 Other amnesia: Secondary | ICD-10-CM | POA: Diagnosis not present

## 2013-04-16 DIAGNOSIS — R51 Headache: Secondary | ICD-10-CM | POA: Diagnosis not present

## 2013-04-16 DIAGNOSIS — R4182 Altered mental status, unspecified: Secondary | ICD-10-CM | POA: Diagnosis not present

## 2013-04-16 DIAGNOSIS — Z79899 Other long term (current) drug therapy: Secondary | ICD-10-CM | POA: Diagnosis not present

## 2013-04-17 DIAGNOSIS — F331 Major depressive disorder, recurrent, moderate: Secondary | ICD-10-CM | POA: Diagnosis not present

## 2013-04-18 ENCOUNTER — Other Ambulatory Visit (HOSPITAL_COMMUNITY): Payer: Self-pay | Admitting: Nurse Practitioner

## 2013-04-18 ENCOUNTER — Other Ambulatory Visit: Payer: Self-pay | Admitting: Neurology

## 2013-04-18 DIAGNOSIS — R4182 Altered mental status, unspecified: Secondary | ICD-10-CM

## 2013-04-18 DIAGNOSIS — G43909 Migraine, unspecified, not intractable, without status migrainosus: Secondary | ICD-10-CM

## 2013-04-18 NOTE — Progress Notes (Signed)
Patient Discharge Instructions:  After Visit Summary (AVS):   Faxed to:  04/18/13 Discharge Summary Note:   Faxed to:  04/18/13 Psychiatric Admission Assessment Note:   Faxed to:  04/18/13 Suicide Risk Assessment - Discharge Assessment:   Faxed to:  04/18/13 Faxed/Sent to the Next Level Care provider:  04/18/13 Faxed to [facility] @ [phone]  Patsey Berthold, 04/18/2013, 3:48 PM

## 2013-04-25 ENCOUNTER — Ambulatory Visit (HOSPITAL_COMMUNITY): Payer: No Typology Code available for payment source | Attending: Nurse Practitioner

## 2013-05-31 ENCOUNTER — Ambulatory Visit (HOSPITAL_COMMUNITY)
Admission: RE | Admit: 2013-05-31 | Discharge: 2013-05-31 | Disposition: A | Discharge status: Home / Self Care | Payer: No Typology Code available for payment source | Source: Ambulatory Visit | Attending: Nurse Practitioner | Admitting: Nurse Practitioner

## 2013-05-31 DIAGNOSIS — G9389 Other specified disorders of brain: Secondary | ICD-10-CM | POA: Diagnosis not present

## 2013-05-31 DIAGNOSIS — R4182 Altered mental status, unspecified: Secondary | ICD-10-CM | POA: Insufficient documentation

## 2013-05-31 DIAGNOSIS — G319 Degenerative disease of nervous system, unspecified: Secondary | ICD-10-CM | POA: Diagnosis not present

## 2013-05-31 DIAGNOSIS — G43909 Migraine, unspecified, not intractable, without status migrainosus: Secondary | ICD-10-CM

## 2013-05-31 DIAGNOSIS — R51 Headache: Secondary | ICD-10-CM | POA: Diagnosis present

## 2013-06-07 DIAGNOSIS — Z23 Encounter for immunization: Secondary | ICD-10-CM | POA: Diagnosis not present

## 2013-08-09 DIAGNOSIS — Z6828 Body mass index (BMI) 28.0-28.9, adult: Secondary | ICD-10-CM | POA: Diagnosis not present

## 2013-08-09 DIAGNOSIS — R5381 Other malaise: Secondary | ICD-10-CM | POA: Diagnosis not present

## 2013-08-09 DIAGNOSIS — R5383 Other fatigue: Secondary | ICD-10-CM | POA: Diagnosis not present

## 2013-08-09 DIAGNOSIS — F411 Generalized anxiety disorder: Secondary | ICD-10-CM | POA: Diagnosis not present

## 2013-08-21 DIAGNOSIS — S335XXA Sprain of ligaments of lumbar spine, initial encounter: Secondary | ICD-10-CM | POA: Diagnosis not present

## 2013-08-21 DIAGNOSIS — Z6829 Body mass index (BMI) 29.0-29.9, adult: Secondary | ICD-10-CM | POA: Diagnosis not present

## 2013-11-14 DIAGNOSIS — F331 Major depressive disorder, recurrent, moderate: Secondary | ICD-10-CM | POA: Diagnosis not present

## 2013-11-15 DIAGNOSIS — Z6828 Body mass index (BMI) 28.0-28.9, adult: Secondary | ICD-10-CM | POA: Diagnosis not present

## 2013-11-15 DIAGNOSIS — D492 Neoplasm of unspecified behavior of bone, soft tissue, and skin: Secondary | ICD-10-CM | POA: Diagnosis not present

## 2013-11-29 NOTE — H&P (Signed)
  NTS SOAP Note  Vital Signs:  Vitals as of: 57/02/6201: Systolic 559: Diastolic 67: Heart Rate 64: Temp 97.22F: Height 81ft 11in: Weight 209Lbs 0 Ounces: Pain Level 3: BMI 29.15  BMI : 29.15 kg/m2  Subjective: This 63 year old male presents for of an umbilical hernia.  Was lifiting some thing heavy and started having pain and swelling at umbilicus.  Made worse with straining.  Does go back in when lying down.  No nausea,  vomiting.  Review of Symptoms:  Constitutional:unremarkable   Head:unremarkable Eyes:unremarkable   Nose/Mouth/Throat:unremarkable Cardiovascular:  unremarkable Respiratory:unremarkable Gastrointestinal:  unremarkable   Genitourinary:unremarkable   Musculoskeletal:unremarkable Skin:unremarkable Hematolgic/Lymphatic:unremarkable   Allergic/Immunologic:unremarkable   Past Medical History:  Reviewed  Past Medical History  Medical Problems:  High Blood pressure, High cholesterol Psychiatric History:  Anxiety, Depression Allergies: nkda Medications: baby asa, xanax, bumex, sertraline, toprimate, simvastatin, depakote, protonix   Social History:Reviewed  Social History  Preferred Language: English Race:  White Ethnicity: Not Hispanic / Latino Age: 71 Years 6 Months Marital Status:  M Alcohol:  No Recreational drug(s):  No   Smoking Status: Light tobacco smoker reviewed on 11/27/2013 Started Date:  Packs per week: 1.00 Functional Status reviewed on 11/27/2013 ------------------------------------------------ Bathing: Normal Cooking: Normal Dressing: Normal Driving: Normal Eating: Normal Managing Meds: Normal Oral Care: Normal Shopping: Normal Toileting: Normal Transferring: Normal Walking: Normal Cognitive Status reviewed on 11/27/2013 ------------------------------------------------ Attention: Normal Decision Making: Normal Language: Normal Memory: Normal Motor: Normal Perception: Normal Problem Solving:  Normal Visual and Spatial: Normal   Family History:Reviewed  Family Health History Mother, Deceased; Healthy;  Father, Deceased; Coronary arteriosclerosis;     Objective Information: General:Well appearing, well nourished in no distress. Heart:RRR, no murmur or gallop.  Normal S1, S2.  No S3, S4.  Lungs:  CTA bilaterally, no wheezes, rhonchi, rales.  Breathing unlabored. Abdomen:Soft, NT/ND, no HSM, no masses.  Reducible umbilical hernia.  Assessment:Umbilical hernia  Diagnoses: 741.6  L84.5 Umbilical hernia (Umbilical hernia without obstruction or gangrene)  Procedures: 36468 - OFFICE OUTPATIENT VISIT 25 MINUTES    Plan:  Scheduled for umbilical herniorrhaphy with mesh on 12/14/13.   Patient Education:Alternative treatments to surgery were discussed with patient (and family).  Risks and benefits  of procedure including bleeding,  infection,  mesh use,  and recurrence of the hernia were fully explained to the patient (and family) who gave informed consent. Patient/family questions were addressed.  Follow-up:Pending Surgery

## 2013-12-02 ENCOUNTER — Emergency Department (HOSPITAL_COMMUNITY): Payer: No Typology Code available for payment source

## 2013-12-02 ENCOUNTER — Emergency Department (HOSPITAL_COMMUNITY)
Admission: EM | Admit: 2013-12-02 | Discharge: 2013-12-03 | Disposition: A | Discharge status: Home / Self Care | Payer: No Typology Code available for payment source | Attending: Emergency Medicine | Admitting: Emergency Medicine

## 2013-12-02 ENCOUNTER — Encounter (HOSPITAL_COMMUNITY): Payer: Self-pay

## 2013-12-02 DIAGNOSIS — Z7982 Long term (current) use of aspirin: Secondary | ICD-10-CM | POA: Insufficient documentation

## 2013-12-02 DIAGNOSIS — R05 Cough: Secondary | ICD-10-CM

## 2013-12-02 DIAGNOSIS — R059 Cough, unspecified: Secondary | ICD-10-CM

## 2013-12-02 DIAGNOSIS — W1830XA Fall on same level, unspecified, initial encounter: Secondary | ICD-10-CM | POA: Insufficient documentation

## 2013-12-02 DIAGNOSIS — F319 Bipolar disorder, unspecified: Secondary | ICD-10-CM | POA: Diagnosis not present

## 2013-12-02 DIAGNOSIS — F419 Anxiety disorder, unspecified: Secondary | ICD-10-CM | POA: Diagnosis not present

## 2013-12-02 DIAGNOSIS — Z043 Encounter for examination and observation following other accident: Secondary | ICD-10-CM | POA: Diagnosis not present

## 2013-12-02 DIAGNOSIS — Z720 Tobacco use: Secondary | ICD-10-CM | POA: Insufficient documentation

## 2013-12-02 DIAGNOSIS — Y9289 Other specified places as the place of occurrence of the external cause: Secondary | ICD-10-CM | POA: Diagnosis not present

## 2013-12-02 DIAGNOSIS — Z862 Personal history of diseases of the blood and blood-forming organs and certain disorders involving the immune mechanism: Secondary | ICD-10-CM | POA: Insufficient documentation

## 2013-12-02 DIAGNOSIS — R509 Fever, unspecified: Secondary | ICD-10-CM | POA: Diagnosis present

## 2013-12-02 DIAGNOSIS — M1990 Unspecified osteoarthritis, unspecified site: Secondary | ICD-10-CM | POA: Diagnosis not present

## 2013-12-02 DIAGNOSIS — G43909 Migraine, unspecified, not intractable, without status migrainosus: Secondary | ICD-10-CM | POA: Diagnosis not present

## 2013-12-02 DIAGNOSIS — Y9389 Activity, other specified: Secondary | ICD-10-CM | POA: Diagnosis not present

## 2013-12-02 DIAGNOSIS — Z79899 Other long term (current) drug therapy: Secondary | ICD-10-CM | POA: Diagnosis not present

## 2013-12-02 DIAGNOSIS — J209 Acute bronchitis, unspecified: Secondary | ICD-10-CM

## 2013-12-02 DIAGNOSIS — E780 Pure hypercholesterolemia: Secondary | ICD-10-CM | POA: Insufficient documentation

## 2013-12-02 DIAGNOSIS — R410 Disorientation, unspecified: Secondary | ICD-10-CM

## 2013-12-02 LAB — CBC WITH DIFFERENTIAL/PLATELET
Basophils Absolute: 0.1 10*3/uL (ref 0.0–0.1)
Basophils Relative: 1 % (ref 0–1)
Eosinophils Absolute: 0 10*3/uL (ref 0.0–0.7)
Eosinophils Relative: 0 % (ref 0–5)
HEMATOCRIT: 36.7 % — AB (ref 39.0–52.0)
Hemoglobin: 11.6 g/dL — ABNORMAL LOW (ref 13.0–17.0)
Lymphocytes Relative: 9 % — ABNORMAL LOW (ref 12–46)
Lymphs Abs: 1.1 10*3/uL (ref 0.7–4.0)
MCH: 24.2 pg — AB (ref 26.0–34.0)
MCHC: 31.6 g/dL (ref 30.0–36.0)
MCV: 76.5 fL — ABNORMAL LOW (ref 78.0–100.0)
MONOS PCT: 11 % (ref 3–12)
Monocytes Absolute: 1.4 10*3/uL — ABNORMAL HIGH (ref 0.1–1.0)
NEUTROS ABS: 10.4 10*3/uL — AB (ref 1.7–7.7)
Neutrophils Relative %: 79 % — ABNORMAL HIGH (ref 43–77)
Platelets: 205 10*3/uL (ref 150–400)
RBC: 4.8 MIL/uL (ref 4.22–5.81)
RDW: 14.9 % (ref 11.5–15.5)
WBC: 13 10*3/uL — ABNORMAL HIGH (ref 4.0–10.5)

## 2013-12-02 LAB — COMPREHENSIVE METABOLIC PANEL
ALBUMIN: 3.3 g/dL — AB (ref 3.5–5.2)
ALK PHOS: 60 U/L (ref 39–117)
ALT: 27 U/L (ref 0–53)
AST: 38 U/L — ABNORMAL HIGH (ref 0–37)
Anion gap: 11 (ref 5–15)
BUN: 11 mg/dL (ref 6–23)
CHLORIDE: 102 meq/L (ref 96–112)
CO2: 24 mEq/L (ref 19–32)
Calcium: 8.7 mg/dL (ref 8.4–10.5)
Creatinine, Ser: 1.28 mg/dL (ref 0.50–1.35)
GFR calc Af Amer: 68 mL/min — ABNORMAL LOW (ref >90)
GFR calc non Af Amer: 59 mL/min — ABNORMAL LOW (ref >90)
Glucose, Bld: 118 mg/dL — ABNORMAL HIGH (ref 70–99)
Potassium: 4.2 mEq/L (ref 3.7–5.3)
Sodium: 137 mEq/L (ref 137–147)
TOTAL PROTEIN: 6.9 g/dL (ref 6.0–8.3)
Total Bilirubin: 0.2 mg/dL — ABNORMAL LOW (ref 0.3–1.2)

## 2013-12-02 NOTE — ED Notes (Signed)
Fever, cough, family states confused at times

## 2013-12-02 NOTE — ED Notes (Signed)
Family states he's fallen 3 times today

## 2013-12-02 NOTE — ED Provider Notes (Signed)
CSN: [id number]     Arrival date & time 12/02/13  2137 History   None    Chief Complaint  Patient presents with  . Fever     (Consider location/radiation/quality/duration/timing/severity/associated sxs/prior Treatment) Patient is a 63 y.o. male presenting with fever. The history is provided by the patient and a relative. No language interpreter was used.  Fever Max temp prior to arrival:  101 Temp source:  Oral Severity:  Moderate Onset quality:  Gradual Duration:  3 days Timing:  Constant Progression:  Worsening Chronicity:  New Relieved by:  Nothing Worsened by:  Nothing tried Ineffective treatments:  None tried Associated symptoms: cough   Associated symptoms: no dysuria, no nausea, no sore throat and no vomiting   Risk factors: no sick contacts   Pt reports cough for several days.  Family reports pt fell three times today.  They report pt has been confused today.   Pt had similar 1 year ago and was diagnosed with pneumonia.  Pt has a bruise to left elbow but he reports no pain.   Past Medical History  Diagnosis Date  . High cholesterol   . Iron deficiency anemia 12/09/2011  . Depression   . Arthritis   . Tinnitus   . Migraine   . Bipolar disorder   . Anxiety    Past Surgical History  Procedure Laterality Date  . Rotator cuff repair Bilateral   . Hand surgery Bilateral   . Esophagogastroduodenoscopy  01/27/2012    Procedure: ESOPHAGOGASTRODUODENOSCOPY (EGD);  Surgeon: Rogene Houston, MD;  Location: [facility];  Service: Endoscopy;  Laterality: N/A;  350-rescheduled to 9:15 Ann notitied pt  . Hernia repair    . Back surgery    . Mass excision Left 07/14/2012    Procedure: EXCISION SOFT TISSUE MASS LEG;  Surgeon: Jamesetta So, MD;  Location: [facility];  Service: General;  Laterality: Left;   No family history on file. History  Substance Use Topics  . Smoking status: Current Every Day Smoker  . Smokeless tobacco: Not on file     Comment: quit over 3 yrs ago  .  Alcohol Use: No    Review of Systems  Constitutional: Positive for fever.  HENT: Negative for sore throat.   Respiratory: Positive for cough.   Gastrointestinal: Negative for nausea and vomiting.  Genitourinary: Negative for dysuria.  All other systems reviewed and are negative.     Allergies  Review of patient's allergies indicates no known allergies.  Home Medications   Prior to Admission medications   Medication Sig Start Date End Date Taking? Authorizing Provider  ARIPiprazole (ABILIFY) 10 MG tablet Take 10 mg by mouth daily.   Yes Historical Provider, MD  Ascorbic Acid (VITAMIN C) 500 MG CHEW Chew 1 tablet (500 mg total) by mouth every morning. One to three times per day. 04/13/13  Yes Elmarie Shiley, NP  aspirin EC 81 MG tablet Take 1 tablet (81 mg total) by mouth daily. 04/13/13  Yes Elmarie Shiley, NP  buPROPion (WELLBUTRIN XL) 150 MG 24 hr tablet Take 450 mg by mouth daily.   Yes Historical Provider, MD  divalproex (DEPAKOTE ER) 250 MG 24 hr tablet Take 750 mg by mouth daily.   Yes Historical Provider, MD  gabapentin (NEURONTIN) 300 MG capsule Take 300 mg by mouth 2 (two) times daily.   Yes Historical Provider, MD  mirtazapine (REMERON) 15 MG tablet Take 1 tablet (15 mg total) by mouth at bedtime. 04/13/13  Yes Elmarie Shiley,  NP  Multiple Vitamin (MULTIVITAMIN WITH MINERALS) TABS tablet Take 1 tablet by mouth daily. Centrum Silver 04/13/13  Yes Elmarie Shiley, NP  niacin 500 MG tablet Take 2 tablets (1,000 mg total) by mouth every morning. 04/13/13  Yes Elmarie Shiley, NP  pantoprazole (PROTONIX) 40 MG tablet Take 1 tablet (40 mg total) by mouth daily. 04/13/13  Yes Elmarie Shiley, NP  simvastatin (ZOCOR) 40 MG tablet Take 1 tablet (40 mg total) by mouth daily. 04/13/13  Yes Elmarie Shiley, NP  ARIPiprazole (ABILIFY) 10 MG tablet Take 1 tablet (10 mg total) by mouth daily. Patient not taking: Reported on 11/30/2013 04/13/13   Elmarie Shiley, NP  buPROPion (WELLBUTRIN XL) 300 MG 24 hr tablet Take 1 tablet  (300 mg total) by mouth every morning. Patient not taking: Reported on 12/02/2013 04/13/13   Elmarie Shiley, NP  divalproex (DEPAKOTE ER) 500 MG 24 hr tablet Take 1 tablet (500 mg total) by mouth 2 (two) times daily after a meal. Patient not taking: Reported on 12/02/2013 04/13/13   Elmarie Shiley, NP  naphazoline-glycerin (CLEAR EYES) 0.012-0.2 % SOLN Place 2 drops into both eyes daily as needed for irritation.    Historical Provider, MD  OnabotulinumtoxinA (BOTOX IJ) Inject as directed. Every 3 to 4 months for migraines.    Historical Provider, MD  Potassium Gluconate 595 MG CAPS Take 1 capsule by mouth 2 (two) times daily.    Historical Provider, MD  Testosterone (AXIRON) 30 MG/ACT SOLN Apply 1 application topically daily. 04/13/13   Elmarie Shiley, NP   BP 147/67 mmHg  Pulse 89  Temp(Src) 100.9 F (38.3 C) (Oral)  Resp 24  Ht 5\' 11"  (1.803 m)  Wt 210 lb (95.255 kg)  BMI 29.30 kg/m2  SpO2 95% Physical Exam  Constitutional: He appears well-developed and well-nourished.  HENT:  Head: Normocephalic.  Eyes: Conjunctivae and EOM are normal. Pupils are equal, round, and reactive to light.  Neck: Normal range of motion.  Cardiovascular: Normal rate and normal heart sounds.   Pulmonary/Chest: Effort normal.  rhonchi  Abdominal: Soft.  Musculoskeletal: Normal range of motion.  Neurological: He is alert.  Skin: Skin is warm.  Psychiatric: He has a normal mood and affect.  Nursing note and vitals reviewed.   ED Course  Procedures (including critical care time) Labs Review Labs Reviewed  CBC WITH DIFFERENTIAL    Imaging Review Dg Chest 2 View  12/02/2013   CLINICAL DATA:  Pt c/o cough, sob, congestion, dizziness, weakness, and fever x 2 days. Pt became very dizzy and unbalanced during standing imaging, so a sitting [facility] view was done to better image the apices  EXAM: CHEST  2 VIEW  COMPARISON:  02/13/2013  FINDINGS: Lungs are adequately inflated without consolidation or effusion. The  cardiomediastinal silhouette and remainder the exam is unchanged.  IMPRESSION: No active cardiopulmonary disease.   Electronically Signed   By: Marin Olp M.D.   On: 12/02/2013 23:00     EKG Interpretation None      MDM old records reviewed.  Pt has a history of confusion and falls second to taking medications incorrectly.   Pt also has a history of similar second to pneumonia.    Pt denies taking any extra medication or confusion medications.      Final diagnoses:  None    Pt's care turned over to Dr. Christy Gentles.   Ct and labs pending.    Hollace Kinnier [place], PA-C 12/03/13 [place], MD 12/03/13 [phone]

## 2013-12-03 ENCOUNTER — Emergency Department (HOSPITAL_COMMUNITY): Payer: No Typology Code available for payment source

## 2013-12-03 LAB — RAPID URINE DRUG SCREEN, HOSP PERFORMED
AMPHETAMINES: NOT DETECTED
BENZODIAZEPINES: NOT DETECTED
Barbiturates: NOT DETECTED
Cocaine: NOT DETECTED
OPIATES: NOT DETECTED
Tetrahydrocannabinol: NOT DETECTED

## 2013-12-03 LAB — URINALYSIS, ROUTINE W REFLEX MICROSCOPIC
Bilirubin Urine: NEGATIVE
Glucose, UA: NEGATIVE mg/dL
Hgb urine dipstick: NEGATIVE
Ketones, ur: NEGATIVE mg/dL
LEUKOCYTES UA: NEGATIVE
NITRITE: NEGATIVE
PH: 7 (ref 5.0–8.0)
Protein, ur: NEGATIVE mg/dL
SPECIFIC GRAVITY, URINE: 1.01 (ref 1.005–1.030)
Urobilinogen, UA: 0.2 mg/dL (ref 0.0–1.0)

## 2013-12-03 LAB — VALPROIC ACID LEVEL: Valproic Acid Lvl: 22.1 ug/mL — ABNORMAL LOW (ref 50.0–100.0)

## 2013-12-03 LAB — AMMONIA: Ammonia: 31 umol/L (ref 11–60)

## 2013-12-03 MED ORDER — ALBUTEROL SULFATE HFA 108 (90 BASE) MCG/ACT IN AERS
2.0000 | INHALATION_SPRAY | Freq: Once | RESPIRATORY_TRACT | Status: AC
  Administered 2013-12-03: 2 via RESPIRATORY_TRACT
  Filled 2013-12-03: qty 6.7

## 2013-12-03 MED ORDER — LEVOFLOXACIN 750 MG PO TABS
750.0000 mg | ORAL_TABLET | Freq: Once | ORAL | Status: AC
  Administered 2013-12-03: 750 mg via ORAL
  Filled 2013-12-03: qty 1

## 2013-12-03 MED ORDER — IBUPROFEN 800 MG PO TABS
800.0000 mg | ORAL_TABLET | Freq: Once | ORAL | Status: AC
  Administered 2013-12-03: 800 mg via ORAL
  Filled 2013-12-03: qty 1

## 2013-12-03 MED ORDER — LEVOFLOXACIN 750 MG PO TABS: ORAL_TABLET | ORAL | Status: DC

## 2013-12-03 NOTE — ED Provider Notes (Signed)
Date: 12/03/2013 78  Rate: 78  Rhythm: normal sinus rhythm  QRS Axis: normal  Intervals: normal  ST/T Wave abnormalities: normal  Conduction Disutrbances:none     Sharyon Cable, MD 12/03/13 0116

## 2013-12-03 NOTE — ED Provider Notes (Signed)
Pt well appearing He is in no distress He has cough, but denies HA or any other complaints He ambulates without difficulty No ataxia No arm/leg drift He is awake/alert/oriented to person/place/time.  No confusion noted He has no injury from fall Lung exam reveals coarse BS/wheeze bilaterally CXR reviewed and negative, however patient/nephew reports similar to prior pneumonia (had pneumonia last year diagnosed by CT imaging) Will place on levaquin and given albuterol inhaler Vitals improved I feel he is safe/appropriate for outpatient management BP 129/70 mmHg  Pulse 77  Temp(Src) 100.9 F (38.3 C) (Oral)  Resp 20  Ht 5\' 11"  (1.803 m)  Wt 210 lb (95.255 kg)  BMI 29.30 kg/m2  SpO2 100% We discussed return precautions Also = advised need for PCP followup this week   Sharyon Cable, MD 12/03/13 [phone]

## 2013-12-03 NOTE — Progress Notes (Signed)
Patient given inhaler demonstrated use,

## 2013-12-10 ENCOUNTER — Other Ambulatory Visit (HOSPITAL_COMMUNITY): Payer: Self-pay

## 2013-12-10 NOTE — Patient Instructions (Signed)
Corey [name]  12/10/2013   Your procedure is scheduled on:   12/14/2013  Report to [facility] at  69  AM.  Call this number if you have problems the morning of surgery: [phone]   Remember:   Do not eat food or drink liquids after midnight.   Take these medicines the morning of surgery with A SIP OF WATER:  Abilify, depakote, neurontin, protonix   Do not wear jewelry, make-up or nail polish.  Do not wear lotions, powders, or perfumes.   Do not shave 48 hours prior to surgery. Men may shave face and neck.  Do not bring valuables to the hospital.  [facility] is not responsible for any belongings or valuables.               Contacts, dentures or bridgework may not be worn into surgery.  Leave suitcase in the car. After surgery it may be brought to your room.  For patients admitted to the hospital, discharge time is determined by your treatment team.               Patients discharged the day of surgery will not be allowed to drive home.  Name and phone number of your driver: family  Special Instructions: Shower using CHG 2 nights before surgery and the night before surgery.  If you shower the day of surgery use CHG.  Use special wash - you have one bottle of CHG for all showers.  You should use approximately 1/3 of the bottle for each shower.   Please read over the following fact sheets that you were given: Pain Booklet, Coughing and Deep Breathing, Surgical Site Infection Prevention, Anesthesia Post-op Instructions and Care and Recovery After Surgery Hernia A hernia occurs when an internal organ pushes out through a weak spot in the abdominal wall. Hernias most commonly occur in the groin and around the navel. Hernias often can be pushed back into place (reduced). Most hernias tend to get worse over time. Some abdominal hernias can get stuck in the opening (irreducible or incarcerated hernia) and cannot be reduced. An irreducible abdominal hernia which is tightly squeezed  into the opening is at risk for impaired blood supply (strangulated hernia). A strangulated hernia is a medical emergency. Because of the risk for an irreducible or strangulated hernia, surgery may be recommended to repair a hernia. CAUSES   Heavy lifting.  Prolonged coughing.  Straining to have a bowel movement.  A cut (incision) made during an abdominal surgery. HOME CARE INSTRUCTIONS   Bed rest is not required. You may continue your normal activities.  Avoid lifting more than 10 pounds (4.5 kg) or straining.  Cough gently. If you are a smoker it is best to stop. Even the best hernia repair can break down with the continual strain of coughing. Even if you do not have your hernia repaired, a cough will continue to aggravate the problem.  Do not wear anything tight over your hernia. Do not try to keep it in with an outside bandage or truss. These can damage abdominal contents if they are trapped within the hernia sac.  Eat a normal diet.  Avoid constipation. Straining over long periods of time will increase hernia size and encourage breakdown of repairs. If you cannot do this with diet alone, stool softeners may be used. SEEK IMMEDIATE MEDICAL CARE IF:   You have a fever.  You develop increasing abdominal pain.  You feel nauseous or vomit.  Your hernia is stuck outside the abdomen, looks discolored, feels hard, or is tender.  You have any changes in your bowel habits or in the hernia that are unusual for you.  You have increased pain or swelling around the hernia.  You cannot push the hernia back in place by applying gentle pressure while lying down. MAKE SURE YOU:   Understand these instructions.  Will watch your condition.  Will get help right away if you are not doing well or get worse. Document Released: 01/11/2005 Document Revised: 04/05/2011 Document Reviewed: 08/31/2007 [facility] Patient Information 2015 [place], [place]. This information is not intended to replace  advice given to you by your health care provider. Make sure you discuss any questions you have with your health care provider. PATIENT INSTRUCTIONS POST-ANESTHESIA  IMMEDIATELY FOLLOWING SURGERY:  Do not drive or operate machinery for the first twenty four hours after surgery.  Do not make any important decisions for twenty four hours after surgery or while taking narcotic pain medications or sedatives.  If you develop intractable nausea and vomiting or a severe headache please notify your doctor immediately.  FOLLOW-UP:  Please make an appointment with your surgeon as instructed. You do not need to follow up with anesthesia unless specifically instructed to do so.  WOUND CARE INSTRUCTIONS (if applicable):  Keep a dry clean dressing on the anesthesia/puncture wound site if there is drainage.  Once the wound has quit draining you may leave it open to air.  Generally you should leave the bandage intact for twenty four hours unless there is drainage.  If the epidural site drains for more than 36-48 hours please call the anesthesia department.  QUESTIONS?:  Please feel free to call your physician or the hospital operator if you have any questions, and they will be happy to assist you.

## 2013-12-11 ENCOUNTER — Encounter (HOSPITAL_COMMUNITY): Payer: Self-pay

## 2013-12-11 ENCOUNTER — Encounter (HOSPITAL_COMMUNITY)
Admission: RE | Admit: 2013-12-11 | Discharge: 2013-12-11 | Disposition: A | Discharge status: Home / Self Care | Payer: No Typology Code available for payment source | Source: Ambulatory Visit | Attending: General Surgery | Admitting: General Surgery

## 2013-12-11 DIAGNOSIS — F418 Other specified anxiety disorders: Secondary | ICD-10-CM | POA: Diagnosis not present

## 2013-12-11 DIAGNOSIS — Z79899 Other long term (current) drug therapy: Secondary | ICD-10-CM | POA: Diagnosis not present

## 2013-12-11 DIAGNOSIS — E780 Pure hypercholesterolemia: Secondary | ICD-10-CM | POA: Diagnosis not present

## 2013-12-11 DIAGNOSIS — F209 Schizophrenia, unspecified: Secondary | ICD-10-CM | POA: Diagnosis not present

## 2013-12-11 DIAGNOSIS — F319 Bipolar disorder, unspecified: Secondary | ICD-10-CM | POA: Diagnosis not present

## 2013-12-11 DIAGNOSIS — D649 Anemia, unspecified: Secondary | ICD-10-CM | POA: Diagnosis not present

## 2013-12-11 DIAGNOSIS — K429 Umbilical hernia without obstruction or gangrene: Secondary | ICD-10-CM | POA: Diagnosis present

## 2013-12-11 DIAGNOSIS — Z87891 Personal history of nicotine dependence: Secondary | ICD-10-CM | POA: Diagnosis not present

## 2013-12-11 DIAGNOSIS — I10 Essential (primary) hypertension: Secondary | ICD-10-CM | POA: Diagnosis not present

## 2013-12-11 LAB — BASIC METABOLIC PANEL
ANION GAP: 10 (ref 5–15)
BUN: 9 mg/dL (ref 6–23)
CALCIUM: 9 mg/dL (ref 8.4–10.5)
CHLORIDE: 105 meq/L (ref 96–112)
CO2: 28 meq/L (ref 19–32)
CREATININE: 1.32 mg/dL (ref 0.50–1.35)
GFR calc Af Amer: 65 mL/min — ABNORMAL LOW (ref >90)
GFR calc non Af Amer: 56 mL/min — ABNORMAL LOW (ref >90)
Glucose, Bld: 106 mg/dL — ABNORMAL HIGH (ref 70–99)
Potassium: 4.9 mEq/L (ref 3.7–5.3)
Sodium: 143 mEq/L (ref 137–147)

## 2013-12-11 LAB — CBC WITH DIFFERENTIAL/PLATELET
BASOS ABS: 0.1 10*3/uL (ref 0.0–0.1)
BASOS PCT: 1 % (ref 0–1)
Eosinophils Absolute: 0.2 10*3/uL (ref 0.0–0.7)
Eosinophils Relative: 2 % (ref 0–5)
HEMATOCRIT: 38.1 % — AB (ref 39.0–52.0)
HEMOGLOBIN: 12.1 g/dL — AB (ref 13.0–17.0)
LYMPHS ABS: 3.2 10*3/uL (ref 0.7–4.0)
LYMPHS PCT: 28 % (ref 12–46)
MCH: 23.9 pg — AB (ref 26.0–34.0)
MCHC: 31.8 g/dL (ref 30.0–36.0)
MCV: 75.3 fL — AB (ref 78.0–100.0)
MONO ABS: 0.7 10*3/uL (ref 0.1–1.0)
Monocytes Relative: 6 % (ref 3–12)
Neutro Abs: 7.1 10*3/uL (ref 1.7–7.7)
Neutrophils Relative %: 63 % (ref 43–77)
Platelets: 337 10*3/uL (ref 150–400)
RBC: 5.06 MIL/uL (ref 4.22–5.81)
RDW: 15.6 % — ABNORMAL HIGH (ref 11.5–15.5)
WBC: 11.3 10*3/uL — ABNORMAL HIGH (ref 4.0–10.5)

## 2013-12-14 ENCOUNTER — Ambulatory Visit (HOSPITAL_COMMUNITY): Payer: No Typology Code available for payment source | Admitting: Anesthesiology

## 2013-12-14 ENCOUNTER — Encounter (HOSPITAL_COMMUNITY): Payer: Self-pay | Admitting: *Deleted

## 2013-12-14 ENCOUNTER — Encounter (HOSPITAL_COMMUNITY)
Admission: RE | Disposition: A | Discharge status: Home / Self Care | Payer: Self-pay | Source: Ambulatory Visit | Attending: General Surgery

## 2013-12-14 ENCOUNTER — Ambulatory Visit (HOSPITAL_COMMUNITY)
Admission: RE | Admit: 2013-12-14 | Discharge: 2013-12-14 | Disposition: A | Discharge status: Home / Self Care | Payer: No Typology Code available for payment source | Source: Ambulatory Visit | Attending: General Surgery | Admitting: General Surgery

## 2013-12-14 DIAGNOSIS — Z79899 Other long term (current) drug therapy: Secondary | ICD-10-CM | POA: Insufficient documentation

## 2013-12-14 DIAGNOSIS — K429 Umbilical hernia without obstruction or gangrene: Secondary | ICD-10-CM | POA: Insufficient documentation

## 2013-12-14 DIAGNOSIS — F209 Schizophrenia, unspecified: Secondary | ICD-10-CM | POA: Insufficient documentation

## 2013-12-14 DIAGNOSIS — E780 Pure hypercholesterolemia: Secondary | ICD-10-CM | POA: Insufficient documentation

## 2013-12-14 DIAGNOSIS — F319 Bipolar disorder, unspecified: Secondary | ICD-10-CM | POA: Insufficient documentation

## 2013-12-14 DIAGNOSIS — I10 Essential (primary) hypertension: Secondary | ICD-10-CM | POA: Insufficient documentation

## 2013-12-14 DIAGNOSIS — D649 Anemia, unspecified: Secondary | ICD-10-CM | POA: Insufficient documentation

## 2013-12-14 DIAGNOSIS — F418 Other specified anxiety disorders: Secondary | ICD-10-CM | POA: Insufficient documentation

## 2013-12-14 DIAGNOSIS — Z87891 Personal history of nicotine dependence: Secondary | ICD-10-CM | POA: Insufficient documentation

## 2013-12-14 HISTORY — PX: INSERTION OF MESH: SHX5868

## 2013-12-14 HISTORY — PX: UMBILICAL HERNIA REPAIR: SHX196

## 2013-12-14 SURGERY — REPAIR, HERNIA, UMBILICAL, ADULT
Anesthesia: General | Site: Abdomen

## 2013-12-14 MED ORDER — ONDANSETRON HCL 4 MG/2ML IJ SOLN
4.0000 mg | Freq: Once | INTRAMUSCULAR | Status: AC | PRN
  Administered 2013-12-14: 4 mg via INTRAVENOUS
  Filled 2013-12-14: qty 2

## 2013-12-14 MED ORDER — OXYCODONE-ACETAMINOPHEN 7.5-325 MG PO TABS: 1.0000 | ORAL_TABLET | ORAL | Status: DC | PRN

## 2013-12-14 MED ORDER — CEFAZOLIN SODIUM-DEXTROSE 2-3 GM-% IV SOLR
2.0000 g | INTRAVENOUS | Status: AC
  Administered 2013-12-14: 2 g via INTRAVENOUS
  Filled 2013-12-14: qty 50

## 2013-12-14 MED ORDER — ONDANSETRON HCL 4 MG/2ML IJ SOLN
4.0000 mg | Freq: Once | INTRAMUSCULAR | Status: AC
  Administered 2013-12-14: 4 mg via INTRAVENOUS

## 2013-12-14 MED ORDER — BUPIVACAINE HCL (PF) 0.5 % IJ SOLN
INTRAMUSCULAR | Status: DC | PRN
  Administered 2013-12-14: 5 mL

## 2013-12-14 MED ORDER — FENTANYL CITRATE 0.05 MG/ML IJ SOLN
INTRAMUSCULAR | Status: AC
  Filled 2013-12-14: qty 2

## 2013-12-14 MED ORDER — GLYCOPYRROLATE 0.2 MG/ML IJ SOLN
INTRAMUSCULAR | Status: AC
  Filled 2013-12-14: qty 1

## 2013-12-14 MED ORDER — ONDANSETRON HCL 4 MG/2ML IJ SOLN
INTRAMUSCULAR | Status: AC
  Filled 2013-12-14: qty 2

## 2013-12-14 MED ORDER — LIDOCAINE HCL (PF) 1 % IJ SOLN
INTRAMUSCULAR | Status: AC
Start: 2013-12-14 — End: 2013-12-14
  Filled 2013-12-14: qty 5

## 2013-12-14 MED ORDER — PROPOFOL 10 MG/ML IV BOLUS
INTRAVENOUS | Status: AC
  Filled 2013-12-14: qty 20

## 2013-12-14 MED ORDER — FENTANYL CITRATE 0.05 MG/ML IJ SOLN
25.0000 ug | INTRAMUSCULAR | Status: DC | PRN
  Administered 2013-12-14: 50 ug via INTRAVENOUS
  Filled 2013-12-14: qty 2

## 2013-12-14 MED ORDER — LIDOCAINE HCL (CARDIAC) 10 MG/ML IV SOLN
INTRAVENOUS | Status: DC | PRN
  Administered 2013-12-14: 20 mg via INTRAVENOUS

## 2013-12-14 MED ORDER — MIDAZOLAM HCL 2 MG/2ML IJ SOLN
1.0000 mg | INTRAMUSCULAR | Status: DC | PRN
Start: 2013-12-14 — End: 2013-12-14
  Administered 2013-12-14: 2 mg via INTRAVENOUS

## 2013-12-14 MED ORDER — KETOROLAC TROMETHAMINE 30 MG/ML IJ SOLN
30.0000 mg | Freq: Once | INTRAMUSCULAR | Status: AC
  Administered 2013-12-14: 30 mg via INTRAVENOUS
  Filled 2013-12-14: qty 1

## 2013-12-14 MED ORDER — MIDAZOLAM HCL 2 MG/2ML IJ SOLN
INTRAMUSCULAR | Status: AC
  Filled 2013-12-14: qty 2

## 2013-12-14 MED ORDER — 0.9 % SODIUM CHLORIDE (POUR BTL) OPTIME
TOPICAL | Status: DC | PRN
  Administered 2013-12-14: 1000 mL

## 2013-12-14 MED ORDER — POVIDONE-IODINE 10 % OINT PACKET
TOPICAL_OINTMENT | CUTANEOUS | Status: DC | PRN
  Administered 2013-12-14: 1 via TOPICAL

## 2013-12-14 MED ORDER — GLYCOPYRROLATE 0.2 MG/ML IJ SOLN
0.2000 mg | Freq: Once | INTRAMUSCULAR | Status: AC
  Administered 2013-12-14: 0.2 mg via INTRAVENOUS

## 2013-12-14 MED ORDER — FENTANYL CITRATE 0.05 MG/ML IJ SOLN
INTRAMUSCULAR | Status: DC | PRN
  Administered 2013-12-14 (×2): 25 ug via INTRAVENOUS

## 2013-12-14 MED ORDER — LACTATED RINGERS IV SOLN
INTRAVENOUS | Status: DC
Start: 2013-12-14 — End: 2013-12-14
  Administered 2013-12-14: 08:00:00 via INTRAVENOUS

## 2013-12-14 MED ORDER — CHLORHEXIDINE GLUCONATE 4 % EX LIQD: 1.0000 "application " | Freq: Once | CUTANEOUS | Status: DC

## 2013-12-14 MED ORDER — BUPIVACAINE HCL (PF) 0.5 % IJ SOLN
INTRAMUSCULAR | Status: AC
  Filled 2013-12-14: qty 30

## 2013-12-14 MED ORDER — FENTANYL CITRATE 0.05 MG/ML IJ SOLN
25.0000 ug | INTRAMUSCULAR | Status: AC
  Administered 2013-12-14 (×2): 25 ug via INTRAVENOUS

## 2013-12-14 MED ORDER — PROPOFOL 10 MG/ML IV BOLUS
INTRAVENOUS | Status: DC | PRN
  Administered 2013-12-14: 20 mg via INTRAVENOUS
  Administered 2013-12-14: 140 mg via INTRAVENOUS

## 2013-12-14 MED ORDER — POVIDONE-IODINE 10 % EX OINT
TOPICAL_OINTMENT | CUTANEOUS | Status: AC
  Filled 2013-12-14: qty 1

## 2013-12-14 SURGICAL SUPPLY — 36 items
BAG HAMPER (MISCELLANEOUS) ×3 IMPLANT
BLADE 11 SAFETY STRL DISP (BLADE) IMPLANT
BLADE SURG SZ11 CARB STEEL (BLADE) ×3 IMPLANT
CHLORAPREP W/TINT 26ML (MISCELLANEOUS) ×3 IMPLANT
CLOTH BEACON ORANGE TIMEOUT ST (SAFETY) ×3 IMPLANT
COVER LIGHT HANDLE STERIS (MISCELLANEOUS) ×6 IMPLANT
DECANTER SPIKE VIAL GLASS SM (MISCELLANEOUS) ×3 IMPLANT
ELECT REM PT RETURN 9FT ADLT (ELECTROSURGICAL) ×3
ELECTRODE REM PT RTRN 9FT ADLT (ELECTROSURGICAL) ×1 IMPLANT
GLOVE BIOGEL M 7.0 STRL (GLOVE) ×6 IMPLANT
GLOVE BIOGEL PI IND STRL 7.0 (GLOVE) ×2 IMPLANT
GLOVE BIOGEL PI INDICATOR 7.0 (GLOVE) ×4
GLOVE EXAM NITRILE MD LF STRL (GLOVE) ×3 IMPLANT
GLOVE SURG SS PI 7.5 STRL IVOR (GLOVE) ×6 IMPLANT
GOWN STRL REUS W/ TWL XL LVL3 (GOWN DISPOSABLE) ×1 IMPLANT
GOWN STRL REUS W/TWL LRG LVL3 (GOWN DISPOSABLE) ×6 IMPLANT
GOWN STRL REUS W/TWL XL LVL3 (GOWN DISPOSABLE) ×2
INST SET MINOR GENERAL (KITS) ×3 IMPLANT
KIT ROOM TURNOVER APOR (KITS) ×3 IMPLANT
MANIFOLD NEPTUNE II (INSTRUMENTS) ×3 IMPLANT
MESH VENTRALEX ST 1-7/10 CRC S (Mesh General) ×3 IMPLANT
NEEDLE HYPO 25X1 1.5 SAFETY (NEEDLE) ×3 IMPLANT
NS IRRIG 1000ML POUR BTL (IV SOLUTION) ×3 IMPLANT
PACK MINOR (CUSTOM PROCEDURE TRAY) ×3 IMPLANT
PAD ARMBOARD 7.5X6 YLW CONV (MISCELLANEOUS) ×3 IMPLANT
SET BASIN LINEN APH (SET/KITS/TRAYS/PACK) ×3 IMPLANT
SPONGE GAUZE 2X2 8PLY STER LF (GAUZE/BANDAGES/DRESSINGS) ×1
SPONGE GAUZE 2X2 8PLY STRL LF (GAUZE/BANDAGES/DRESSINGS) ×2 IMPLANT
STAPLER VISISTAT (STAPLE) ×3 IMPLANT
SUT ETHIBOND NAB MO 7 #0 18IN (SUTURE) ×3 IMPLANT
SUT VIC AB 2-0 CT2 27 (SUTURE) ×3 IMPLANT
SUT VIC AB 3-0 SH 27 (SUTURE) ×2
SUT VIC AB 3-0 SH 27X BRD (SUTURE) ×1 IMPLANT
SUT VICRYL AB 3 0 TIES (SUTURE) IMPLANT
SYR CONTROL 10ML LL (SYRINGE) ×3 IMPLANT
TAPE CLOTH SURG 4X10 WHT LF (GAUZE/BANDAGES/DRESSINGS) ×3 IMPLANT

## 2013-12-14 NOTE — Transfer of Care (Signed)
Immediate Anesthesia Transfer of Care Note  Patient: Corey [name]  Procedure(s) Performed: Procedure(s) (LRB): UMBILICAL HERNIA REPAIR UMBILICAL (N/A) INSERTION OF MESH (N/A)  Patient Location: PACU  Anesthesia Type: General  Level of Consciousness: awake  Airway & Oxygen Therapy: Patient Spontanous Breathing and non-rebreather face mask  Post-op Assessment: Report given to PACU RN, Post -op Vital signs reviewed and stable and Patient moving all extremities  Post vital signs: Reviewed and stable  Complications: No apparent anesthesia complications

## 2013-12-14 NOTE — Interval H&P Note (Signed)
History and Physical Interval Note:  12/14/2013 7:58 AM  Corey [name]  has presented today for surgery, with the diagnosis of umbilical hernia  The various methods of treatment have been discussed with the patient and family. After consideration of risks, benefits and other options for treatment, the patient has consented to  Procedure(s): HERNIA REPAIR UMBILICAL ADULT WITH MESH (N/A) as a surgical intervention .  The patient's history has been reviewed, patient examined, no change in status, stable for surgery.  I have reviewed the patient's chart and labs.  Questions were answered to the patient's satisfaction.     Aviva Signs A

## 2013-12-14 NOTE — Anesthesia Preprocedure Evaluation (Signed)
Anesthesia Evaluation  Patient identified by MRN, date of birth, ID band Patient awake    Reviewed: Allergy & Precautions, H&P , NPO status , Patient's Chart, lab work & pertinent test results  History of Anesthesia Complications Negative for: history of anesthetic complications  Airway Mallampati: I  TM Distance: >3 FB Neck ROM: Full    Dental  (+) Teeth Intact, Partial Upper, Implants   Pulmonary neg pulmonary ROS, former smoker,  breath sounds clear to auscultation        Cardiovascular negative cardio ROS  Rhythm:Regular Rate:Normal     Neuro/Psych  Headaches, PSYCHIATRIC DISORDERS Anxiety Depression Bipolar Disorder Schizophrenia    GI/Hepatic   Endo/Other    Renal/GU Renal disease     Musculoskeletal  (+) Arthritis -,   Abdominal   Peds  Hematology  (+) anemia ,   Anesthesia Other Findings   Reproductive/Obstetrics                             Anesthesia Physical Anesthesia Plan  ASA: II  Anesthesia Plan: General   Post-op Pain Management:    Induction: Intravenous  Airway Management Planned: LMA  Additional Equipment:   Intra-op Plan:   Post-operative Plan: Extubation in OR  Informed Consent: I have reviewed the patients History and Physical, chart, labs and discussed the procedure including the risks, benefits and alternatives for the proposed anesthesia with the patient or authorized representative who has indicated his/her understanding and acceptance.     Plan Discussed with:   Anesthesia Plan Comments:         Anesthesia Quick Evaluation

## 2013-12-14 NOTE — Anesthesia Postprocedure Evaluation (Signed)
  Anesthesia Post-op Note  Patient: Corey [name]  Procedure(s) Performed: Procedure(s): UMBILICAL HERNIA REPAIR UMBILICAL (N/A) INSERTION OF MESH (N/A)  Patient Location: PACU  Anesthesia Type:General  Level of Consciousness: patient cooperative easily arouses  Airway and Oxygen Therapy: Patient Spontanous Breathing  Post-op Pain: none  Post-op Assessment: Post-op Vital signs reviewed, Patient's Cardiovascular Status Stable, Respiratory Function Stable, Patent Airway and No signs of Nausea or vomiting  Post-op Vital Signs: Reviewed and stable  Last Vitals:  Filed Vitals:   12/14/13 0930  BP: 114/74  Pulse: 64  Temp:   Resp: 8    Complications: No apparent anesthesia complications

## 2013-12-14 NOTE — Discharge Instructions (Signed)
Umbilical Herniorrhaphy, Care After °Refer to this sheet in the next few weeks. These instructions provide you with information on caring for yourself after your procedure. Your health care provider may also give you more specific instructions. Your treatment has been planned according to current medical practices, but problems sometimes occur. Call your health care provider if you have any problems or questions after your procedure. °HOME CARE INSTRUCTIONS °· If you are given antibiotic medicine, take it as directed. Finish it even if you start to feel better. °· Only take over-the-counter or prescription medicines for pain, fever, or discomfort as directed by your health care provider. Do not take aspirin. It can cause bleeding. °· Do not get your surgical cut (incision) area wet unless your health care provider says it is okay. °· Avoid lifting objects heavier than 10 lb (4.5 kg) for 8 weeks after surgery. °· Avoid sexual activity for 5 weeks after surgery or as directed by your health care provider. °· Do not drive while taking prescription pain medicine. °· You may return to your other normal, daily activities after 3 days or as directed by your health care provider. °SEEK MEDICAL CARE IF: °· You notice blood or fluid leaking from the surgical site. °· Your incision area becomes red or swollen. °· Your pain at the surgical site becomes worse or is not relieved by medicine. °· You have problems urinating. °· You feel nauseous or vomit more than 2 days after surgery. °· You notice the bulge in your abdomen returns after the procedure. °SEEK IMMEDIATE MEDICAL CARE IF: °· You have a fever. °· You have nausea or vomiting that will not stop. °Document Released: 07/13/2011 Document Revised: 05/28/2013 Document Reviewed: 07/13/2011 °[employer]® Patient Information ©2015 [employer]. This information is not intended to replace advice given to you by your health care provider. Make sure you discuss any questions you have  with your health care provider. ° °

## 2013-12-14 NOTE — Op Note (Signed)
Patient:  Corey [name]  DOB:  04/06/1950  MRN:  [id number]   Preop Diagnosis:  Umbilical hernia  Postop Diagnosis:  Same  Procedure:  Umbilical herniorrhaphy with mesh  Surgeon:  Aviva Signs, M.D.  Anes:  Gen.  Indications:  Patient is a 63 year old white male who presents with an umbilical hernia. The risks and benefits of the procedure including bleeding, infection, and recurrence of the hernia were fully explained to the patient, who gave informed consent.  Procedure note:  The patient was placed the supine position. After general anesthesia was administered, the abdomen was prepped and draped using usual sterile technique with ChloraPrep. Surgical site confirmation was performed.  An infraumbilical incision was made down to the fascia. The umbilicus was freed away from the underlying fascia. Omentum was noted within the hernia sac. The hernia sac was excised at the fascial level and the omentum reduced. A finger was then used and the anterior abdominal wall was free of any adhesions. A 4.3 cm ventralex ST hernia patch was then inserted. It was secured to the fascia using 0 Ethibond interrupted sutures. The overlying fascia was reapproximated transversely using 0 Ethibond interrupted sutures. The base the umbilicus was secured back to the fascia using a 2-0 Vicryl interrupted suture. The subcutaneous layer was reapproximated using a 3-0 Vicryl interrupted suture. The skin was closed using staples. 0.5% Sensorcaine was instilled the surrounding wound. Betadine ointment and a dry sterile dressing were applied.  All tape and needle counts were correct at the end of the procedure. Patient was awakened and transferred to PACU in stable condition.  Complications:  None  EBL:  Minimal  Specimen:  None

## 2013-12-14 NOTE — Anesthesia Procedure Notes (Signed)
Procedure Name: LMA Insertion Date/Time: 12/14/2013 8:20 AM Performed by: Vista Deck Pre-anesthesia Checklist: Patient identified, Patient being monitored, Emergency Drugs available, Timeout performed and Suction available Patient Re-evaluated:Patient Re-evaluated prior to inductionOxygen Delivery Method: Circle System Utilized Preoxygenation: Pre-oxygenation with 100% oxygen Intubation Type: IV induction Ventilation: Mask ventilation without difficulty LMA: LMA inserted LMA Size: 4.0 Number of attempts: 1 Placement Confirmation: positive ETCO2 and breath sounds checked- equal and bilateral Tube secured with: Tape

## 2013-12-17 ENCOUNTER — Encounter (HOSPITAL_COMMUNITY): Payer: Self-pay | Admitting: General Surgery

## 2014-01-02 ENCOUNTER — Encounter (INDEPENDENT_AMBULATORY_CARE_PROVIDER_SITE_OTHER): Payer: Self-pay | Admitting: *Deleted

## 2014-01-10 ENCOUNTER — Other Ambulatory Visit (INDEPENDENT_AMBULATORY_CARE_PROVIDER_SITE_OTHER): Payer: Self-pay | Admitting: Internal Medicine

## 2014-02-18 DIAGNOSIS — F331 Major depressive disorder, recurrent, moderate: Secondary | ICD-10-CM | POA: Diagnosis not present

## 2014-03-15 DIAGNOSIS — E291 Testicular hypofunction: Secondary | ICD-10-CM | POA: Diagnosis not present

## 2014-03-15 DIAGNOSIS — Z6830 Body mass index (BMI) 30.0-30.9, adult: Secondary | ICD-10-CM | POA: Diagnosis not present

## 2014-03-15 DIAGNOSIS — F419 Anxiety disorder, unspecified: Secondary | ICD-10-CM | POA: Diagnosis not present

## 2014-03-15 DIAGNOSIS — Z23 Encounter for immunization: Secondary | ICD-10-CM | POA: Diagnosis not present

## 2014-03-15 DIAGNOSIS — E782 Mixed hyperlipidemia: Secondary | ICD-10-CM | POA: Diagnosis not present

## 2014-04-08 DIAGNOSIS — F331 Major depressive disorder, recurrent, moderate: Secondary | ICD-10-CM | POA: Diagnosis not present

## 2014-04-29 ENCOUNTER — Ambulatory Visit (INDEPENDENT_AMBULATORY_CARE_PROVIDER_SITE_OTHER): Payer: No Typology Code available for payment source | Admitting: Internal Medicine

## 2014-04-29 VITALS — BP 110/78 | HR 74 | Temp 97.0°F | Resp 18 | Ht 71.0 in | Wt 211.8 lb

## 2014-04-29 DIAGNOSIS — K219 Gastro-esophageal reflux disease without esophagitis: Secondary | ICD-10-CM

## 2014-04-29 DIAGNOSIS — K2270 Barrett's esophagus without dysplasia: Secondary | ICD-10-CM | POA: Diagnosis not present

## 2014-04-29 NOTE — Progress Notes (Signed)
Presenting complaint;  Follow-up for chronic GERD/Barrett's esophagus.  Subjective:  Patient is 64 year old Caucasian male was here for scheduled visit. He has chronic GERD complicated by 3 cm long Barrett's esophagus. He has no complaints. He has rare heartburn with certain foods. He denies dysphagia and nocturnal regurgitation hoarseness or chronic cough. Bowels move daily and he denies melena or rectal bleeding. He is not having any side effects with pantoprazole. He has gained 18 pounds since his last visit 13 months ago.   Current Medications: Outpatient Encounter Prescriptions as of 04/29/2014  Medication Sig  . ARIPiprazole (ABILIFY) 10 MG tablet Take 10 mg by mouth daily.  . Ascorbic Acid (VITAMIN C) 500 MG CHEW Chew 1 tablet (500 mg total) by mouth every morning. One to three times per day.  Marland Kitchen aspirin EC 81 MG tablet Take 1 tablet (81 mg total) by mouth daily.  . divalproex (DEPAKOTE ER) 250 MG 24 hr tablet Take 750 mg by mouth daily.  Marland Kitchen gabapentin (NEURONTIN) 300 MG capsule Take 300 mg by mouth 2 (two) times daily.  Marland Kitchen levofloxacin (LEVAQUIN) 750 MG tablet X 4 days  . mirtazapine (REMERON) 15 MG tablet Take 1 tablet (15 mg total) by mouth at bedtime.  . Multiple Vitamin (MULTIVITAMIN WITH MINERALS) TABS tablet Take 1 tablet by mouth daily. Centrum Silver  . naphazoline-glycerin (CLEAR EYES) 0.012-0.2 % SOLN Place 2 drops into both eyes daily as needed for irritation.  . niacin 500 MG tablet Take 2 tablets (1,000 mg total) by mouth every morning.  . OnabotulinumtoxinA (BOTOX IJ) Inject as directed. Every 3 to 4 months for migraines.  Marland Kitchen oxyCODONE-acetaminophen (PERCOCET) 7.5-325 MG per tablet Take 1-2 tablets by mouth every 4 (four) hours as needed.  . pantoprazole (PROTONIX) 40 MG tablet TAKE ONE TABLET BY MOUTH ONCE DAILY 30 MINUTES BEFORE BREAKFAST.  Marland Kitchen Potassium Gluconate 595 MG CAPS Take 1 capsule by mouth 2 (two) times daily.  . simvastatin (ZOCOR) 40 MG tablet Take 1 tablet  (40 mg total) by mouth daily.  . Testosterone (AXIRON) 30 MG/ACT SOLN Apply 1 application topically daily.     Objective: There were no vitals taken for this visit. Patient is alert and in no acute distress. Conjunctiva is pink. Sclera is nonicteric Oropharyngeal mucosa is normal. No neck masses or thyromegaly noted. Cardiac exam with regular rhythm normal S1 and S2. No murmur or gallop noted. Lungs are clear to auscultation. Abdomen symmetrical soft and nontender without organomegaly or masses.  No LE edema or clubbing noted.    Assessment:  #1. Chronic GERD complicated by 3 cm long Barrett's segment. Last EGD with biopsy was in January 2014. Symptoms well controlled with therapy. #2. History of iron deficiency anemia diagnosed 2 years ago felt to be secondary to blood donation. H&H over a year ago was normal. #3. Patient is average risk for CRC. Last colonoscopy was normal in December 2011.   Plan:  Continue anti-reflux measures. Continue pantoprazole at 40 mg by mouth every morning. Patient will call if he develops dysphagia or medication quit working. Will request copy of recent blood work from Dr. Nolon Rod office.. Office visit in 1 year.

## 2014-04-29 NOTE — Patient Instructions (Signed)
Patient to call for swallowing difficulty or heartburn.

## 2014-04-30 ENCOUNTER — Encounter (INDEPENDENT_AMBULATORY_CARE_PROVIDER_SITE_OTHER): Payer: Self-pay | Admitting: Internal Medicine

## 2014-04-30 DIAGNOSIS — H25013 Cortical age-related cataract, bilateral: Secondary | ICD-10-CM | POA: Diagnosis not present

## 2014-04-30 DIAGNOSIS — H2513 Age-related nuclear cataract, bilateral: Secondary | ICD-10-CM | POA: Diagnosis not present

## 2014-05-15 DIAGNOSIS — H25011 Cortical age-related cataract, right eye: Secondary | ICD-10-CM | POA: Diagnosis not present

## 2014-05-15 DIAGNOSIS — H2511 Age-related nuclear cataract, right eye: Secondary | ICD-10-CM | POA: Diagnosis not present

## 2014-05-29 DIAGNOSIS — Z6831 Body mass index (BMI) 31.0-31.9, adult: Secondary | ICD-10-CM | POA: Diagnosis not present

## 2014-05-29 DIAGNOSIS — Z0000 Encounter for general adult medical examination without abnormal findings: Secondary | ICD-10-CM | POA: Diagnosis not present

## 2014-05-29 DIAGNOSIS — E6609 Other obesity due to excess calories: Secondary | ICD-10-CM | POA: Diagnosis not present

## 2014-07-02 ENCOUNTER — Ambulatory Visit (HOSPITAL_COMMUNITY)
Admission: RE | Admit: 2014-07-02 | Discharge: 2014-07-02 | Disposition: A | Discharge status: Home / Self Care | Payer: No Typology Code available for payment source | Source: Ambulatory Visit | Attending: Family Medicine | Admitting: Family Medicine

## 2014-07-02 ENCOUNTER — Other Ambulatory Visit (HOSPITAL_COMMUNITY): Payer: Self-pay | Admitting: Family Medicine

## 2014-07-02 DIAGNOSIS — M5432 Sciatica, left side: Secondary | ICD-10-CM

## 2014-07-02 DIAGNOSIS — M25552 Pain in left hip: Secondary | ICD-10-CM | POA: Insufficient documentation

## 2014-07-09 ENCOUNTER — Other Ambulatory Visit (HOSPITAL_COMMUNITY): Payer: Self-pay | Admitting: Family Medicine

## 2014-07-09 ENCOUNTER — Ambulatory Visit (HOSPITAL_COMMUNITY)
Admission: RE | Admit: 2014-07-09 | Discharge: 2014-07-09 | Disposition: A | Discharge status: Home / Self Care | Payer: No Typology Code available for payment source | Source: Ambulatory Visit | Attending: Family Medicine | Admitting: Family Medicine

## 2014-07-09 DIAGNOSIS — M25551 Pain in right hip: Secondary | ICD-10-CM | POA: Diagnosis not present

## 2014-07-09 DIAGNOSIS — M85851 Other specified disorders of bone density and structure, right thigh: Secondary | ICD-10-CM

## 2014-07-09 DIAGNOSIS — I7090 Unspecified atherosclerosis: Secondary | ICD-10-CM | POA: Diagnosis not present

## 2014-07-24 DIAGNOSIS — F331 Major depressive disorder, recurrent, moderate: Secondary | ICD-10-CM | POA: Diagnosis not present

## 2014-08-06 ENCOUNTER — Emergency Department (HOSPITAL_COMMUNITY)
Admission: EM | Admit: 2014-08-06 | Discharge: 2014-08-06 | Disposition: A | Discharge status: Home / Self Care | Payer: No Typology Code available for payment source | Attending: Emergency Medicine | Admitting: Emergency Medicine

## 2014-08-06 ENCOUNTER — Encounter (HOSPITAL_COMMUNITY): Payer: Self-pay | Admitting: Emergency Medicine

## 2014-08-06 DIAGNOSIS — Y998 Other external cause status: Secondary | ICD-10-CM | POA: Insufficient documentation

## 2014-08-06 DIAGNOSIS — Y9389 Activity, other specified: Secondary | ICD-10-CM | POA: Insufficient documentation

## 2014-08-06 DIAGNOSIS — Y9289 Other specified places as the place of occurrence of the external cause: Secondary | ICD-10-CM | POA: Insufficient documentation

## 2014-08-06 DIAGNOSIS — Z79899 Other long term (current) drug therapy: Secondary | ICD-10-CM | POA: Insufficient documentation

## 2014-08-06 DIAGNOSIS — S6992XA Unspecified injury of left wrist, hand and finger(s), initial encounter: Secondary | ICD-10-CM | POA: Diagnosis present

## 2014-08-06 DIAGNOSIS — F319 Bipolar disorder, unspecified: Secondary | ICD-10-CM | POA: Insufficient documentation

## 2014-08-06 DIAGNOSIS — Z8669 Personal history of other diseases of the nervous system and sense organs: Secondary | ICD-10-CM | POA: Diagnosis not present

## 2014-08-06 DIAGNOSIS — F419 Anxiety disorder, unspecified: Secondary | ICD-10-CM | POA: Insufficient documentation

## 2014-08-06 DIAGNOSIS — M1990 Unspecified osteoarthritis, unspecified site: Secondary | ICD-10-CM | POA: Insufficient documentation

## 2014-08-06 DIAGNOSIS — S61221A Laceration with foreign body of left index finger without damage to nail, initial encounter: Secondary | ICD-10-CM | POA: Insufficient documentation

## 2014-08-06 DIAGNOSIS — G43909 Migraine, unspecified, not intractable, without status migrainosus: Secondary | ICD-10-CM | POA: Diagnosis not present

## 2014-08-06 DIAGNOSIS — Z862 Personal history of diseases of the blood and blood-forming organs and certain disorders involving the immune mechanism: Secondary | ICD-10-CM | POA: Diagnosis not present

## 2014-08-06 DIAGNOSIS — Z7982 Long term (current) use of aspirin: Secondary | ICD-10-CM | POA: Insufficient documentation

## 2014-08-06 DIAGNOSIS — S61219A Laceration without foreign body of unspecified finger without damage to nail, initial encounter: Secondary | ICD-10-CM

## 2014-08-06 DIAGNOSIS — Y289XXA Contact with unspecified sharp object, undetermined intent, initial encounter: Secondary | ICD-10-CM | POA: Insufficient documentation

## 2014-08-06 DIAGNOSIS — Z87891 Personal history of nicotine dependence: Secondary | ICD-10-CM | POA: Diagnosis not present

## 2014-08-06 MED ORDER — POVIDONE-IODINE 10 % EX SOLN
CUTANEOUS | Status: AC
  Filled 2014-08-06: qty 118

## 2014-08-06 NOTE — ED Notes (Signed)
Pt cut left index finger on potato peeler last night.

## 2014-08-06 NOTE — Discharge Instructions (Signed)
Keep your wound clean and dry, covered as well until a good scab has formed.  Gently wash twice daily with soap and water, then reapply a new dressing as discussed.  Get rechecked for any worsened symptoms such as infection including redness, increased swelling or drainage.  Ice and elevation will help with discomfort.  You may also use ibuprofen for pain relief.

## 2014-08-06 NOTE — ED Notes (Signed)
Dressing and splint applied. Pt instructed on wound care, verbalized understanding.

## 2014-08-06 NOTE — ED Provider Notes (Signed)
CSN: [id number]     Arrival date & time 08/06/14  0941 History   First MD Initiated Contact with Patient 08/06/14 1030     Chief Complaint  Patient presents with  . Laceration     (Consider location/radiation/quality/duration/timing/severity/associated sxs/prior Treatment) The history is provided by the patient.   Corey [name] is a 64 y.o. male presenting for evaluation of an avulsed laceration to his distal left index finger occuring last night while using a potato peeler.  He reports it bled profusely but he has obtained hemostasis by applying pressure.  He has throbbing pain unless he keeps the finger elevated.  He denies radiation of pain and denies weakness or numbness in the extremity. He is right handed and is utd with his tetanus vaccine.    Past Medical History  Diagnosis Date  . High cholesterol   . Iron deficiency anemia 12/09/2011  . Depression   . Arthritis   . Tinnitus   . Migraine   . Bipolar disorder   . Anxiety    Past Surgical History  Procedure Laterality Date  . Rotator cuff repair Bilateral   . Hand surgery Bilateral   . Esophagogastroduodenoscopy  01/27/2012    Procedure: ESOPHAGOGASTRODUODENOSCOPY (EGD);  Surgeon: Rogene Houston, MD;  Location: [facility];  Service: Endoscopy;  Laterality: N/A;  350-rescheduled to 9:15 Ann notitied pt  . Hernia repair    . Back surgery    . Mass excision Left 07/14/2012    Procedure: EXCISION SOFT TISSUE MASS LEG;  Surgeon: Jamesetta So, MD;  Location: [facility];  Service: General;  Laterality: Left;  . Umbilical hernia repair N/A 12/14/2013    Procedure: UMBILICAL HERNIORRHAPHY;  Surgeon: Jamesetta So, MD;  Location: [facility];  Service: General;  Laterality: N/A;  . Insertion of mesh N/A 12/14/2013    Procedure: INSERTION OF MESH;  Surgeon: Jamesetta So, MD;  Location: [facility];  Service: General;  Laterality: N/A;   No family history on file. History  Substance Use Topics  . Smoking status: Former Research scientist (life sciences)  .  Smokeless tobacco: Not on file     Comment: quit over 3 yrs ago  . Alcohol Use: No    Review of Systems  Constitutional: Negative for fever and chills.  Respiratory: Negative for shortness of breath and wheezing.   Skin: Positive for wound.  Neurological: Negative for numbness.      Allergies  Review of patient's allergies indicates no known allergies.  Home Medications   Prior to Admission medications   Medication Sig Start Date End Date Taking? Authorizing Provider  Ascorbic Acid (VITAMIN C) 500 MG CHEW Chew 1 tablet (500 mg total) by mouth every morning. One to three times per day. 04/13/13  Yes Niel Hummer, NP  aspirin EC 81 MG tablet Take 1 tablet (81 mg total) by mouth daily. 04/13/13  Yes Niel Hummer, NP  buPROPion (WELLBUTRIN XL) 300 MG 24 hr tablet Take 300 mg by mouth daily.   Yes Historical Provider, MD  divalproex (DEPAKOTE ER) 250 MG 24 hr tablet Take 750 mg by mouth daily.   Yes Historical Provider, MD  gabapentin (NEURONTIN) 300 MG capsule Take 300 mg by mouth 2 (two) times daily.   Yes Historical Provider, MD  mirtazapine (REMERON) 15 MG tablet Take 1 tablet (15 mg total) by mouth at bedtime. 04/13/13  Yes Niel Hummer, NP  Multiple Vitamin (MULTIVITAMIN WITH MINERALS) TABS tablet Take 1 tablet by mouth daily. Centrum  Silver 04/13/13  Yes Niel Hummer, NP  naphazoline-glycerin (CLEAR EYES) 0.012-0.2 % SOLN Place 2 drops into both eyes daily as needed for irritation.   Yes Historical Provider, MD  pantoprazole (PROTONIX) 40 MG tablet TAKE ONE TABLET BY MOUTH ONCE DAILY 30 MINUTES BEFORE BREAKFAST. 01/11/14  Yes Butch Penny, NP  potassium gluconate 595 MG TABS tablet Take 595 mg by mouth daily.   Yes Historical Provider, MD  simvastatin (ZOCOR) 40 MG tablet Take 1 tablet (40 mg total) by mouth daily. 04/13/13  Yes Niel Hummer, NP   BP 121/75 mmHg  Pulse 71  Temp(Src) 97.9 F (36.6 C) (Oral)  Resp 14  Ht 5\' 11"  (1.803 m)  Wt 210 lb (95.255 kg)  BMI 29.30  kg/m2  SpO2 100% Physical Exam  Constitutional: He is oriented to person, place, and time. He appears well-developed and well-nourished.  HENT:  Head: Normocephalic.  Cardiovascular: Normal rate.   Pulmonary/Chest: Effort normal.  Musculoskeletal: He exhibits tenderness.  Neurological: He is alert and oriented to person, place, and time. No sensory deficit.  Skin: Laceration noted.  0.5 cm greatest diameter avulsion to left distal finger tip, not involving the nail.  Hemostatic, subcutaneous.      ED Course  Procedures (including critical care time) Labs Review Labs Reviewed - No data to display  Imaging Review No results found.   EKG Interpretation None      MDM   Final diagnoses:  Laceration of finger of left hand, initial encounter    Reassurance given that this wound should heal without complication.  Wound care was performed including cleaning the injury, xeroform applied followed by dressing, then finger splint to protect the wound.  He was advised to wash in soap and water bid, reapply new piece of  xeroform and keep covered until a good scab has developed.  Prn f/u with pcp or return here for any complications.  Evalee Jefferson, PA-C 08/07/14 2157  Milton Ferguson, MD 08/07/14 2220

## 2014-09-04 DIAGNOSIS — F419 Anxiety disorder, unspecified: Secondary | ICD-10-CM | POA: Diagnosis not present

## 2014-09-04 DIAGNOSIS — M25559 Pain in unspecified hip: Secondary | ICD-10-CM | POA: Diagnosis not present

## 2014-09-04 DIAGNOSIS — Z1389 Encounter for screening for other disorder: Secondary | ICD-10-CM | POA: Diagnosis not present

## 2014-09-04 DIAGNOSIS — E6609 Other obesity due to excess calories: Secondary | ICD-10-CM | POA: Diagnosis not present

## 2014-09-04 DIAGNOSIS — Z6830 Body mass index (BMI) 30.0-30.9, adult: Secondary | ICD-10-CM | POA: Diagnosis not present

## 2014-09-13 DIAGNOSIS — N529 Male erectile dysfunction, unspecified: Secondary | ICD-10-CM | POA: Diagnosis not present

## 2014-09-13 DIAGNOSIS — E6609 Other obesity due to excess calories: Secondary | ICD-10-CM | POA: Diagnosis not present

## 2014-09-13 DIAGNOSIS — Z6830 Body mass index (BMI) 30.0-30.9, adult: Secondary | ICD-10-CM | POA: Diagnosis not present

## 2014-09-13 DIAGNOSIS — E291 Testicular hypofunction: Secondary | ICD-10-CM | POA: Diagnosis not present

## 2014-09-13 DIAGNOSIS — F419 Anxiety disorder, unspecified: Secondary | ICD-10-CM | POA: Diagnosis not present

## 2014-09-13 DIAGNOSIS — T148 Other injury of unspecified body region: Secondary | ICD-10-CM | POA: Diagnosis not present

## 2014-09-13 DIAGNOSIS — M791 Myalgia: Secondary | ICD-10-CM | POA: Diagnosis not present

## 2014-09-13 DIAGNOSIS — Z1389 Encounter for screening for other disorder: Secondary | ICD-10-CM | POA: Diagnosis not present

## 2014-10-08 ENCOUNTER — Other Ambulatory Visit (HOSPITAL_COMMUNITY): Payer: Self-pay | Admitting: Internal Medicine

## 2014-10-08 ENCOUNTER — Ambulatory Visit (HOSPITAL_COMMUNITY)
Admission: RE | Admit: 2014-10-08 | Discharge: 2014-10-08 | Disposition: A | Discharge status: Home / Self Care | Payer: Medicare Other | Source: Ambulatory Visit | Attending: Internal Medicine | Admitting: Internal Medicine

## 2014-10-08 DIAGNOSIS — Z6829 Body mass index (BMI) 29.0-29.9, adult: Secondary | ICD-10-CM | POA: Diagnosis not present

## 2014-10-08 DIAGNOSIS — R102 Pelvic and perineal pain: Secondary | ICD-10-CM | POA: Insufficient documentation

## 2014-10-08 DIAGNOSIS — Z23 Encounter for immunization: Secondary | ICD-10-CM | POA: Diagnosis not present

## 2014-10-08 DIAGNOSIS — Z1389 Encounter for screening for other disorder: Secondary | ICD-10-CM | POA: Diagnosis not present

## 2014-10-08 DIAGNOSIS — M25559 Pain in unspecified hip: Secondary | ICD-10-CM | POA: Diagnosis not present

## 2014-10-08 DIAGNOSIS — M545 Low back pain: Secondary | ICD-10-CM | POA: Diagnosis not present

## 2014-10-08 DIAGNOSIS — S3993XS Unspecified injury of pelvis, sequela: Secondary | ICD-10-CM

## 2014-10-08 DIAGNOSIS — F329 Major depressive disorder, single episode, unspecified: Secondary | ICD-10-CM | POA: Diagnosis not present

## 2014-10-17 DIAGNOSIS — Z1389 Encounter for screening for other disorder: Secondary | ICD-10-CM | POA: Diagnosis not present

## 2014-10-17 DIAGNOSIS — E663 Overweight: Secondary | ICD-10-CM | POA: Diagnosis not present

## 2014-10-17 DIAGNOSIS — S40012D Contusion of left shoulder, subsequent encounter: Secondary | ICD-10-CM | POA: Diagnosis not present

## 2014-10-17 DIAGNOSIS — M791 Myalgia: Secondary | ICD-10-CM | POA: Diagnosis not present

## 2014-10-17 DIAGNOSIS — Z6829 Body mass index (BMI) 29.0-29.9, adult: Secondary | ICD-10-CM | POA: Diagnosis not present

## 2014-10-17 DIAGNOSIS — M25552 Pain in left hip: Secondary | ICD-10-CM | POA: Diagnosis not present

## 2014-11-19 DIAGNOSIS — Z1389 Encounter for screening for other disorder: Secondary | ICD-10-CM | POA: Diagnosis not present

## 2014-11-19 DIAGNOSIS — G894 Chronic pain syndrome: Secondary | ICD-10-CM | POA: Diagnosis not present

## 2014-11-19 DIAGNOSIS — E6609 Other obesity due to excess calories: Secondary | ICD-10-CM | POA: Diagnosis not present

## 2014-11-19 DIAGNOSIS — M461 Sacroiliitis, not elsewhere classified: Secondary | ICD-10-CM | POA: Diagnosis not present

## 2014-11-19 DIAGNOSIS — R252 Cramp and spasm: Secondary | ICD-10-CM | POA: Diagnosis not present

## 2014-11-19 DIAGNOSIS — F419 Anxiety disorder, unspecified: Secondary | ICD-10-CM | POA: Diagnosis not present

## 2015-01-13 DIAGNOSIS — Z1389 Encounter for screening for other disorder: Secondary | ICD-10-CM | POA: Diagnosis not present

## 2015-01-13 DIAGNOSIS — F419 Anxiety disorder, unspecified: Secondary | ICD-10-CM | POA: Diagnosis not present

## 2015-01-13 DIAGNOSIS — Z6829 Body mass index (BMI) 29.0-29.9, adult: Secondary | ICD-10-CM | POA: Diagnosis not present

## 2015-01-13 DIAGNOSIS — G894 Chronic pain syndrome: Secondary | ICD-10-CM | POA: Diagnosis not present

## 2015-02-05 DIAGNOSIS — F331 Major depressive disorder, recurrent, moderate: Secondary | ICD-10-CM | POA: Diagnosis not present

## 2015-02-10 ENCOUNTER — Encounter (INDEPENDENT_AMBULATORY_CARE_PROVIDER_SITE_OTHER): Payer: Self-pay | Admitting: Internal Medicine

## 2015-02-13 DIAGNOSIS — G894 Chronic pain syndrome: Secondary | ICD-10-CM | POA: Diagnosis not present

## 2015-02-13 DIAGNOSIS — R252 Cramp and spasm: Secondary | ICD-10-CM | POA: Diagnosis not present

## 2015-02-13 DIAGNOSIS — M545 Low back pain: Secondary | ICD-10-CM | POA: Diagnosis not present

## 2015-02-13 DIAGNOSIS — Z1389 Encounter for screening for other disorder: Secondary | ICD-10-CM | POA: Diagnosis not present

## 2015-02-13 DIAGNOSIS — M47816 Spondylosis without myelopathy or radiculopathy, lumbar region: Secondary | ICD-10-CM | POA: Diagnosis not present

## 2015-02-13 DIAGNOSIS — Z6829 Body mass index (BMI) 29.0-29.9, adult: Secondary | ICD-10-CM | POA: Diagnosis not present

## 2015-03-11 DIAGNOSIS — Z6828 Body mass index (BMI) 28.0-28.9, adult: Secondary | ICD-10-CM | POA: Diagnosis not present

## 2015-03-11 DIAGNOSIS — G894 Chronic pain syndrome: Secondary | ICD-10-CM | POA: Diagnosis not present

## 2015-03-11 DIAGNOSIS — Z1389 Encounter for screening for other disorder: Secondary | ICD-10-CM | POA: Diagnosis not present

## 2015-03-11 DIAGNOSIS — E663 Overweight: Secondary | ICD-10-CM | POA: Diagnosis not present

## 2015-03-11 DIAGNOSIS — J069 Acute upper respiratory infection, unspecified: Secondary | ICD-10-CM | POA: Diagnosis not present

## 2015-03-17 ENCOUNTER — Other Ambulatory Visit (INDEPENDENT_AMBULATORY_CARE_PROVIDER_SITE_OTHER): Payer: Self-pay | Admitting: Internal Medicine

## 2015-04-18 DIAGNOSIS — Z79899 Other long term (current) drug therapy: Secondary | ICD-10-CM | POA: Diagnosis not present

## 2015-04-18 DIAGNOSIS — G894 Chronic pain syndrome: Secondary | ICD-10-CM | POA: Diagnosis not present

## 2015-04-18 DIAGNOSIS — F329 Major depressive disorder, single episode, unspecified: Secondary | ICD-10-CM | POA: Diagnosis not present

## 2015-04-18 DIAGNOSIS — Z6829 Body mass index (BMI) 29.0-29.9, adult: Secondary | ICD-10-CM | POA: Diagnosis not present

## 2015-04-18 DIAGNOSIS — Z1389 Encounter for screening for other disorder: Secondary | ICD-10-CM | POA: Diagnosis not present

## 2015-04-18 DIAGNOSIS — M545 Low back pain: Secondary | ICD-10-CM | POA: Diagnosis not present

## 2015-04-20 ENCOUNTER — Other Ambulatory Visit (HOSPITAL_COMMUNITY): Payer: Self-pay | Admitting: Psychiatry

## 2015-04-21 DIAGNOSIS — R945 Abnormal results of liver function studies: Secondary | ICD-10-CM | POA: Diagnosis not present

## 2015-04-29 DIAGNOSIS — R740 Nonspecific elevation of levels of transaminase and lactic acid dehydrogenase [LDH]: Secondary | ICD-10-CM | POA: Diagnosis not present

## 2015-05-01 ENCOUNTER — Ambulatory Visit (INDEPENDENT_AMBULATORY_CARE_PROVIDER_SITE_OTHER): Payer: Commercial Managed Care - HMO | Admitting: Internal Medicine

## 2015-05-01 ENCOUNTER — Encounter (INDEPENDENT_AMBULATORY_CARE_PROVIDER_SITE_OTHER): Payer: Self-pay | Admitting: Internal Medicine

## 2015-05-01 VITALS — BP 112/60 | HR 64 | Temp 97.7°F | Ht 71.0 in | Wt 197.6 lb

## 2015-05-01 DIAGNOSIS — K2270 Barrett's esophagus without dysplasia: Secondary | ICD-10-CM | POA: Diagnosis not present

## 2015-05-01 DIAGNOSIS — K219 Gastro-esophageal reflux disease without esophagitis: Secondary | ICD-10-CM

## 2015-05-01 NOTE — Progress Notes (Signed)
Subjective:    Patient ID: Corey [name], male    DOB: May 22, 1950, 65 y.o.   MRN: [id number]  HPIHere today for f/u of his chronic GERD complicated by 3 cm Barrett's esophagus. He was last seen by Dr Laural Golden in April of 2016.  He tells me he is doing good. GERD controlled with Protonix. Appetite is good.  He has had weight loss which was intentional. Usually has a BM daily. No melena or BRRB.  Actively plays golf.  Arthritis controlled with Meloxicam     CBC    Component Value Date/Time   WBC 11.3* 12/11/2013 0910   RBC 5.06 12/11/2013 0910   HGB 12.1* 12/11/2013 0910   HCT 38.1* 12/11/2013 0910   PLT 337 12/11/2013 0910   MCV 75.3* 12/11/2013 0910   MCH 23.9* 12/11/2013 0910   MCHC 31.8 12/11/2013 0910   RDW 15.6* 12/11/2013 0910   LYMPHSABS 3.2 12/11/2013 0910   MONOABS 0.7 12/11/2013 0910   EOSABS 0.2 12/11/2013 0910   BASOSABS 0.1 12/11/2013 0910     Review of Systems Past Medical History  Diagnosis Date  . High cholesterol   . Iron deficiency anemia 12/09/2011  . Depression   . Arthritis   . Tinnitus   . Migraine   . Bipolar disorder ([place])   . Anxiety     Past Surgical History  Procedure Laterality Date  . Rotator cuff repair Bilateral   . Hand surgery Bilateral   . Esophagogastroduodenoscopy  01/27/2012    Procedure: ESOPHAGOGASTRODUODENOSCOPY (EGD);  Surgeon: Rogene Houston, MD;  [facility]: [facility];  Service: Endoscopy;  Laterality: N/A;  350-rescheduled to 9:15 Ann notitied pt  . Hernia repair    . Back surgery    . Mass excision Left 07/14/2012    Procedure: EXCISION SOFT TISSUE MASS LEG;  Surgeon: Jamesetta So, MD;  Location: [facility];  Service: General;  Laterality: Left;  . Umbilical hernia repair N/A 12/14/2013    Procedure: UMBILICAL HERNIORRHAPHY;  Surgeon: Jamesetta So, MD;  Location: [facility];  Service: General;  Laterality: N/A;  . Insertion of mesh N/A 12/14/2013    Procedure: INSERTION OF MESH;  Surgeon: Jamesetta So, MD;  Location:  [facility];  Service: General;  Laterality: N/A;    No Known Allergies  Current Outpatient Prescriptions on File Prior to Visit  Medication Sig Dispense Refill  . Ascorbic Acid (VITAMIN C) 500 MG CHEW Chew 1 tablet (500 mg total) by mouth every morning. One to three times per day. 60 each 0  . aspirin EC 81 MG tablet Take 1 tablet (81 mg total) by mouth daily.    Marland Kitchen buPROPion (WELLBUTRIN XL) 300 MG 24 hr tablet Take 300 mg by mouth daily.    Marland Kitchen gabapentin (NEURONTIN) 300 MG capsule Take 300 mg by mouth 2 (two) times daily.    . mirtazapine (REMERON) 15 MG tablet Take 1 tablet (15 mg total) by mouth at bedtime. 30 tablet 0  . Multiple Vitamin (MULTIVITAMIN WITH MINERALS) TABS tablet Take 1 tablet by mouth daily. Centrum Silver    . pantoprazole (PROTONIX) 40 MG tablet TAKE 1 TABLET BY MOUTH EVERY DAY 30 MINUTES BEFORE BREAKFAST 90 tablet 3  . simvastatin (ZOCOR) 40 MG tablet Take 1 tablet (40 mg total) by mouth daily. 30 tablet    No current facility-administered medications on file prior to visit.        Objective:   Physical Exam  Blood pressure 112/60, pulse 64,  temperature 97.7 F (36.5 C), height 5\' 11"  (1.803 m), weight 197 lb 9.6 oz (89.631 kg).   Alert and oriented. Skin warm and dry. Oral mucosa is moist.   . Sclera anicteric, conjunctivae is pink. Thyroid not enlarged. No cervical lymphadenopathy. Lungs clear. Heart regular rate and rhythm.  Abdomen is soft. Bowel sounds are positive. No hepatomegaly. No abdominal masses felt. No tenderness.  No edema to lower extremities.        Assessment & Plan:    Chronic GERD complicated by 3 cm long Barrett's segment. Last EGD with biopsy was in January 2014. Symptoms well controlled with therapy.  Patient is average risk for CRC. Last colonoscopy was normal in December 2011. OV in1 year.

## 2015-05-01 NOTE — Patient Instructions (Signed)
Continue the Protonix. OV in 1 year.  

## 2015-05-20 DIAGNOSIS — M5136 Other intervertebral disc degeneration, lumbar region: Secondary | ICD-10-CM | POA: Diagnosis not present

## 2015-05-20 DIAGNOSIS — Z6828 Body mass index (BMI) 28.0-28.9, adult: Secondary | ICD-10-CM | POA: Diagnosis not present

## 2015-05-20 DIAGNOSIS — Z1389 Encounter for screening for other disorder: Secondary | ICD-10-CM | POA: Diagnosis not present

## 2015-05-20 DIAGNOSIS — E291 Testicular hypofunction: Secondary | ICD-10-CM | POA: Diagnosis not present

## 2015-05-20 DIAGNOSIS — E663 Overweight: Secondary | ICD-10-CM | POA: Diagnosis not present

## 2015-06-03 DIAGNOSIS — R7989 Other specified abnormal findings of blood chemistry: Secondary | ICD-10-CM | POA: Diagnosis not present

## 2015-06-20 DIAGNOSIS — R5383 Other fatigue: Secondary | ICD-10-CM | POA: Diagnosis not present

## 2015-06-20 DIAGNOSIS — E291 Testicular hypofunction: Secondary | ICD-10-CM | POA: Diagnosis not present

## 2015-06-24 DIAGNOSIS — Z961 Presence of intraocular lens: Secondary | ICD-10-CM | POA: Diagnosis not present

## 2015-06-24 DIAGNOSIS — H25042 Posterior subcapsular polar age-related cataract, left eye: Secondary | ICD-10-CM | POA: Diagnosis not present

## 2015-06-24 DIAGNOSIS — H2512 Age-related nuclear cataract, left eye: Secondary | ICD-10-CM | POA: Diagnosis not present

## 2015-06-25 NOTE — Patient Instructions (Signed)
Corey [name]  06/25/2015     @PREFPERIOPPHARMACY @   Your procedure is scheduled on 07/01/2015.  Report to [name] at 6:30 A.M.  Call this number if you have problems the morning of surgery:  [phone]   Remember:  Do not eat food or drink liquids after midnight.  Take these medicines the morning of surgery with A SIP OF WATER:  WELLBUTRIN, FLEXERIL, NEURONTIN AND PROTONIX   Do not wear jewelry, make-up or nail polish.  Do not wear lotions, powders, or perfumes.  You may wear deodorant.  Do not shave 48 hours prior to surgery.  Men may shave face and neck.  Do not bring valuables to the hospital.  [facility] is not responsible for any belongings or valuables.  Contacts, dentures or bridgework may not be worn into surgery.  Leave your suitcase in the car.  After surgery it may be brought to your room.  For patients admitted to the hospital, discharge time will be determined by your treatment team.  Patients discharged the day of surgery will not be allowed to drive home.   Name and phone number of your driver:   FAMILY Special instructions:  N/A  Please read over the following fact sheets that you were given. Care and Recovery After Surgery      A cataract is a clouding of the lens of the eye. When a lens becomes cloudy, vision is reduced based on the degree and nature of the clouding. Surgery may be needed to improve vision. Surgery removes the cloudy lens and usually replaces it with a substitute lens (intraocular lens, IOL). LET YOUR EYE DOCTOR KNOW ABOUT:  Allergies to food or medicine.  Medicines taken including herbs, eye drops, over-the-counter medicines, and creams.  Use of steroids (by mouth or creams).  Previous problems with anesthetics or numbing medicine.  History of bleeding problems or blood clots.  Previous surgery.  Other health problems, including diabetes and kidney problems.  Possibility of pregnancy, if this applies. RISKS AND  COMPLICATIONS  Infection.  Inflammation of the eyeball (endophthalmitis) that can spread to both eyes (sympathetic ophthalmia).  Poor wound healing.  If an IOL is inserted, it can later fall out of proper position. This is very uncommon.  Clouding of the part of your eye that holds an IOL in place. This is called an "after-cataract." These are uncommon but easily treated. BEFORE THE PROCEDURE  Do not eat or drink anything except small amounts of water for 8 to 12 before your surgery, or as directed by your caregiver.  Unless you are told otherwise, continue any eye drops you have been prescribed.  Talk to your primary caregiver about all other medicines that you take (both prescription and nonprescription). In some cases, you may need to stop or change medicines near the time of your surgery. This is most important if you are taking blood-thinning medicine.Do not stop medicines unless you are told to do so.  Arrange for someone to drive you to and from the procedure.  Do not put contact lenses in either eye on the day of your surgery. PROCEDURE There is more than one method for safely removing a cataract. Your doctor can explain the differences and help determine which is best for you. Phacoemulsification surgery is the most common form of cataract surgery.  An injection is given behind the eye or eye drops are given to make this a painless procedure.  A small cut (incision) is made on the edge  of the clear, dome-shaped surface that covers the front of the eye (cornea).  A tiny probe is painlessly inserted into the eye. This device gives off ultrasound waves that soften and break up the cloudy center of the lens. This makes it easier for the cloudy lens to be removed by suction.  An IOL may be implanted.  The normal lens of the eye is covered by a clear capsule. Part of that capsule is intentionally left in the eye to support the IOL.  Your surgeon may or may not use stitches to  close the incision. There are other forms of cataract surgery that require a larger incision and stitches to close the eye. This approach is taken in cases where the doctor feels that the cataract cannot be easily removed using phacoemulsification. AFTER THE PROCEDURE  When an IOL is implanted, it does not need care. It becomes a permanent part of your eye and cannot be seen or felt.  Your doctor will schedule follow-up exams to check on your progress.  Review your other medicines with your doctor to see which can be resumed after surgery.  Use eye drops or take medicine as prescribed by your doctor.   This information is not intended to replace advice given to you by your health care provider. Make sure you discuss any questions you have with your health care provider.   Document Released: 12/31/2010 Document Revised: 02/01/2014 Document Reviewed: 12/31/2010 Elsevier Interactive Patient Education [employer].

## 2015-06-26 ENCOUNTER — Encounter (HOSPITAL_COMMUNITY): Payer: Self-pay

## 2015-06-26 ENCOUNTER — Encounter (HOSPITAL_COMMUNITY)
Admission: RE | Admit: 2015-06-26 | Discharge: 2015-06-26 | Disposition: A | Discharge status: Home / Self Care | Payer: Commercial Managed Care - HMO | Source: Ambulatory Visit | Attending: Ophthalmology | Admitting: Ophthalmology

## 2015-06-26 ENCOUNTER — Other Ambulatory Visit: Payer: Self-pay

## 2015-06-26 DIAGNOSIS — Z01812 Encounter for preprocedural laboratory examination: Secondary | ICD-10-CM | POA: Diagnosis not present

## 2015-06-26 DIAGNOSIS — Z01810 Encounter for preprocedural cardiovascular examination: Secondary | ICD-10-CM | POA: Diagnosis not present

## 2015-06-26 DIAGNOSIS — R7989 Other specified abnormal findings of blood chemistry: Secondary | ICD-10-CM | POA: Diagnosis not present

## 2015-06-26 LAB — BASIC METABOLIC PANEL
ANION GAP: 10 (ref 5–15)
BUN: 7 mg/dL (ref 6–20)
CALCIUM: 8.6 mg/dL — AB (ref 8.9–10.3)
CO2: 27 mmol/L (ref 22–32)
Chloride: 103 mmol/L (ref 101–111)
Creatinine, Ser: 1 mg/dL (ref 0.61–1.24)
GLUCOSE: 80 mg/dL (ref 65–99)
Potassium: 3.9 mmol/L (ref 3.5–5.1)
SODIUM: 140 mmol/L (ref 135–145)

## 2015-06-26 LAB — CBC WITH DIFFERENTIAL/PLATELET
BASOS ABS: 0 10*3/uL (ref 0.0–0.1)
BASOS PCT: 1 %
EOS PCT: 3 %
Eosinophils Absolute: 0.2 10*3/uL (ref 0.0–0.7)
HCT: 37.1 % — ABNORMAL LOW (ref 39.0–52.0)
Hemoglobin: 11.9 g/dL — ABNORMAL LOW (ref 13.0–17.0)
Lymphocytes Relative: 37 %
Lymphs Abs: 2.6 10*3/uL (ref 0.7–4.0)
MCH: 27.1 pg (ref 26.0–34.0)
MCHC: 32.1 g/dL (ref 30.0–36.0)
MCV: 84.5 fL (ref 78.0–100.0)
MONO ABS: 0.7 10*3/uL (ref 0.1–1.0)
Monocytes Relative: 9 %
Neutro Abs: 3.5 10*3/uL (ref 1.7–7.7)
Neutrophils Relative %: 50 %
PLATELETS: 302 10*3/uL (ref 150–400)
RBC: 4.39 MIL/uL (ref 4.22–5.81)
RDW: 14.3 % (ref 11.5–15.5)
WBC: 7 10*3/uL (ref 4.0–10.5)

## 2015-06-30 DIAGNOSIS — L570 Actinic keratosis: Secondary | ICD-10-CM | POA: Diagnosis not present

## 2015-06-30 DIAGNOSIS — Z1389 Encounter for screening for other disorder: Secondary | ICD-10-CM | POA: Diagnosis not present

## 2015-06-30 DIAGNOSIS — Z6826 Body mass index (BMI) 26.0-26.9, adult: Secondary | ICD-10-CM | POA: Diagnosis not present

## 2015-06-30 DIAGNOSIS — G894 Chronic pain syndrome: Secondary | ICD-10-CM | POA: Diagnosis not present

## 2015-06-30 MED ORDER — CYCLOPENTOLATE-PHENYLEPHRINE OP SOLN OPTIME - NO CHARGE
OPHTHALMIC | Status: AC
  Filled 2015-06-30: qty 2

## 2015-06-30 MED ORDER — KETOROLAC TROMETHAMINE 0.5 % OP SOLN
OPHTHALMIC | Status: AC
  Filled 2015-06-30: qty 5

## 2015-06-30 MED ORDER — TETRACAINE HCL 0.5 % OP SOLN
OPHTHALMIC | Status: AC
  Filled 2015-06-30: qty 4

## 2015-06-30 MED ORDER — PHENYLEPHRINE HCL 2.5 % OP SOLN
OPHTHALMIC | Status: AC
  Filled 2015-06-30: qty 15

## 2015-07-01 ENCOUNTER — Ambulatory Visit (HOSPITAL_COMMUNITY): Payer: Commercial Managed Care - HMO | Admitting: Anesthesiology

## 2015-07-01 ENCOUNTER — Encounter (HOSPITAL_COMMUNITY): Payer: Self-pay

## 2015-07-01 ENCOUNTER — Encounter (HOSPITAL_COMMUNITY)
Admission: RE | Disposition: A | Discharge status: Home / Self Care | Payer: Self-pay | Source: Ambulatory Visit | Attending: Ophthalmology

## 2015-07-01 ENCOUNTER — Ambulatory Visit (HOSPITAL_COMMUNITY)
Admission: RE | Admit: 2015-07-01 | Discharge: 2015-07-01 | Disposition: A | Discharge status: Home / Self Care | Payer: Commercial Managed Care - HMO | Source: Ambulatory Visit | Attending: Ophthalmology | Admitting: Ophthalmology

## 2015-07-01 DIAGNOSIS — M1990 Unspecified osteoarthritis, unspecified site: Secondary | ICD-10-CM | POA: Insufficient documentation

## 2015-07-01 DIAGNOSIS — F418 Other specified anxiety disorders: Secondary | ICD-10-CM | POA: Insufficient documentation

## 2015-07-01 DIAGNOSIS — K219 Gastro-esophageal reflux disease without esophagitis: Secondary | ICD-10-CM | POA: Diagnosis not present

## 2015-07-01 DIAGNOSIS — H25012 Cortical age-related cataract, left eye: Secondary | ICD-10-CM | POA: Diagnosis not present

## 2015-07-01 DIAGNOSIS — H269 Unspecified cataract: Secondary | ICD-10-CM | POA: Diagnosis not present

## 2015-07-01 DIAGNOSIS — Z7951 Long term (current) use of inhaled steroids: Secondary | ICD-10-CM | POA: Insufficient documentation

## 2015-07-01 DIAGNOSIS — Z79899 Other long term (current) drug therapy: Secondary | ICD-10-CM | POA: Insufficient documentation

## 2015-07-01 DIAGNOSIS — Z87891 Personal history of nicotine dependence: Secondary | ICD-10-CM | POA: Insufficient documentation

## 2015-07-01 DIAGNOSIS — F209 Schizophrenia, unspecified: Secondary | ICD-10-CM | POA: Insufficient documentation

## 2015-07-01 DIAGNOSIS — Z7982 Long term (current) use of aspirin: Secondary | ICD-10-CM | POA: Insufficient documentation

## 2015-07-01 DIAGNOSIS — H268 Other specified cataract: Secondary | ICD-10-CM | POA: Diagnosis not present

## 2015-07-01 DIAGNOSIS — F319 Bipolar disorder, unspecified: Secondary | ICD-10-CM | POA: Insufficient documentation

## 2015-07-01 DIAGNOSIS — H25042 Posterior subcapsular polar age-related cataract, left eye: Secondary | ICD-10-CM | POA: Diagnosis not present

## 2015-07-01 HISTORY — PX: CATARACT EXTRACTION W/PHACO: SHX586

## 2015-07-01 SURGERY — PHACOEMULSIFICATION, CATARACT, WITH IOL INSERTION
Anesthesia: Monitor Anesthesia Care | Site: Eye | Laterality: Left

## 2015-07-01 MED ORDER — PHENYLEPHRINE HCL 2.5 % OP SOLN
1.0000 [drp] | OPHTHALMIC | Status: AC
  Administered 2015-07-01 (×3): 1 [drp] via OPHTHALMIC

## 2015-07-01 MED ORDER — EPINEPHRINE HCL 1 MG/ML IJ SOLN
INTRAMUSCULAR | Status: AC
  Filled 2015-07-01: qty 1

## 2015-07-01 MED ORDER — LACTATED RINGERS IV SOLN
INTRAVENOUS | Status: DC
  Administered 2015-07-01: 07:00:00 via INTRAVENOUS

## 2015-07-01 MED ORDER — MIDAZOLAM HCL 2 MG/2ML IJ SOLN: 1.0000 mg | INTRAMUSCULAR | Status: DC | PRN

## 2015-07-01 MED ORDER — PROVISC 10 MG/ML IO SOLN
INTRAOCULAR | Status: DC | PRN
  Administered 2015-07-01: 0.85 mL via INTRAOCULAR

## 2015-07-01 MED ORDER — EPINEPHRINE HCL 1 MG/ML IJ SOLN
INTRAOCULAR | Status: DC | PRN
  Administered 2015-07-01: 500 mL

## 2015-07-01 MED ORDER — FENTANYL CITRATE (PF) 100 MCG/2ML IJ SOLN
INTRAMUSCULAR | Status: AC
  Filled 2015-07-01: qty 2

## 2015-07-01 MED ORDER — MIDAZOLAM HCL 2 MG/2ML IJ SOLN
INTRAMUSCULAR | Status: AC
  Filled 2015-07-01: qty 2

## 2015-07-01 MED ORDER — TETRACAINE HCL 0.5 % OP SOLN
1.0000 [drp] | OPHTHALMIC | Status: AC
  Administered 2015-07-01 (×3): 1 [drp] via OPHTHALMIC

## 2015-07-01 MED ORDER — CYCLOPENTOLATE-PHENYLEPHRINE 0.2-1 % OP SOLN
1.0000 [drp] | OPHTHALMIC | Status: AC
  Administered 2015-07-01 (×3): 1 [drp] via OPHTHALMIC

## 2015-07-01 MED ORDER — BSS IO SOLN
INTRAOCULAR | Status: DC | PRN
  Administered 2015-07-01: 15 mL

## 2015-07-01 MED ORDER — LIDOCAINE HCL 3.5 % OP GEL: 1.0000 "application " | Freq: Once | OPHTHALMIC | Status: DC

## 2015-07-01 MED ORDER — FENTANYL CITRATE (PF) 100 MCG/2ML IJ SOLN
25.0000 ug | INTRAMUSCULAR | Status: AC
  Administered 2015-07-01 (×2): 25 ug via INTRAVENOUS

## 2015-07-01 MED ORDER — TETRACAINE 0.5 % OP SOLN OPTIME - NO CHARGE
OPHTHALMIC | Status: DC | PRN
  Administered 2015-07-01: 2 [drp] via OPHTHALMIC

## 2015-07-01 MED ORDER — KETOROLAC TROMETHAMINE 0.5 % OP SOLN
1.0000 [drp] | OPHTHALMIC | Status: AC
  Administered 2015-07-01 (×3): 1 [drp] via OPHTHALMIC

## 2015-07-01 SURGICAL SUPPLY — 10 items

## 2015-07-01 NOTE — Anesthesia Procedure Notes (Signed)
Procedure Name: MAC Date/Time: 07/01/2015 7:46 AM Performed by: Andree Elk, AMY A Pre-anesthesia Checklist: Patient identified, Timeout performed, Emergency Drugs available, Suction available and Patient being monitored Oxygen Delivery Method: Nasal cannula

## 2015-07-01 NOTE — Anesthesia Postprocedure Evaluation (Signed)
Anesthesia Post Note  Patient: Corey [name]  Procedure(s) Performed: Procedure(s) (LRB): CATARACT EXTRACTION PHACO AND INTRAOCULAR LENS PLACEMENT (IOC) (Left)  Patient location during evaluation: Short Stay Anesthesia Type: MAC Level of consciousness: awake and alert and oriented Pain management: pain level controlled Vital Signs Assessment: post-procedure vital signs reviewed and stable Respiratory status: spontaneous breathing Cardiovascular status: stable Postop Assessment: no signs of nausea or vomiting Anesthetic complications: no    Last Vitals:  Filed Vitals:   07/01/15 0730 07/01/15 0735  BP: 110/69 114/66  Pulse:    Temp:    Resp: 15 18    Last Pain: There were no vitals filed for this visit.               Teren Franckowiak A

## 2015-07-01 NOTE — Anesthesia Preprocedure Evaluation (Signed)
Anesthesia Evaluation  Patient identified by MRN, date of birth, ID band Patient awake    Reviewed: Allergy & Precautions, H&P , NPO status , Patient's Chart, lab work & pertinent test results  History of Anesthesia Complications Negative for: history of anesthetic complications  Airway Mallampati: I  TM Distance: >3 FB Neck ROM: Full    Dental  (+) Teeth Intact, Partial Upper, Implants   Pulmonary neg pulmonary ROS, former smoker,    breath sounds clear to auscultation       Cardiovascular negative cardio ROS   Rhythm:Regular Rate:Normal     Neuro/Psych  Headaches, PSYCHIATRIC DISORDERS Anxiety Depression Bipolar Disorder Schizophrenia    GI/Hepatic neg GERD  ,  Endo/Other    Renal/GU Renal disease     Musculoskeletal  (+) Arthritis ,   Abdominal   Peds  Hematology  (+) anemia ,   Anesthesia Other Findings   Reproductive/Obstetrics                             Anesthesia Physical Anesthesia Plan  ASA: II  Anesthesia Plan: MAC   Post-op Pain Management:    Induction: Intravenous  Airway Management Planned: Nasal Cannula  Additional Equipment:   Intra-op Plan:   Post-operative Plan:   Informed Consent: I have reviewed the patients History and Physical, chart, labs and discussed the procedure including the risks, benefits and alternatives for the proposed anesthesia with the patient or authorized representative who has indicated his/her understanding and acceptance.     Plan Discussed with:   Anesthesia Plan Comments:         Anesthesia Quick Evaluation

## 2015-07-01 NOTE — Discharge Instructions (Signed)
°  °          [facility] Instructions °[address]- [place] [address]-[place] °    ° °1. Avoid closing eyes tightly. One often closes the eye tightly when laughing, talking, sneezing, coughing or if they feel irritated. At these times, you should be careful not to close your eyes tightly. ° °2. Instill eye drops as instructed. To instill drops in your eye, open it, look up and have someone gently pull the lower lid down and instill a couple of drops inside the lower lid. ° °3. Do not touch upper lid. ° °4. Take Advil or Tylenol for pain. ° °5. You may use either eye for near work, such as reading or sewing and you may watch television. ° °6. You may have your hair done at the beauty parlor at any time. ° °7. Wear dark glasses with or without your own glasses if you are in bright light. ° °8. Call our office at [phone] or [phone] if you have sharp pain in your eye or unusual symptoms. ° °9.  FOLLOW UP WITH DR. SHAPIRO TODAY IN HIS [place] OFFICE AT 2:45pm. ° °  °I have received a copy of the above instructions and will follow them.  ° ° ° °IF YOU ARE IN IMMEDIATE DANGER CALL 911! ° °It is important for you to keep your follow-up appointment with your physician after discharge, OR, for you /your caregiver to make a follow-up appointment with your physician / medical provider after discharge. ° °Show these instructions to the next healthcare provider you see. ° °

## 2015-07-01 NOTE — Transfer of Care (Signed)
Immediate Anesthesia Transfer of Care Note  Patient: [name]  Procedure(s) Performed: Procedure(s) with comments: CATARACT EXTRACTION PHACO AND INTRAOCULAR LENS PLACEMENT (IOC) (Left) - CDE: 4.16  Patient Location: Short Stay  Anesthesia Type:MAC  Level of Consciousness: awake, alert , oriented and patient cooperative  Airway & Oxygen Therapy: Patient Spontanous Breathing  Post-op Assessment: Report given to RN and Post -op Vital signs reviewed and stable  Post vital signs: Reviewed and stable  Last Vitals:  Filed Vitals:   07/01/15 0730 07/01/15 0735  BP: 110/69 114/66  Pulse:    Temp:    Resp: 15 18    Last Pain: There were no vitals filed for this visit.    Patients Stated Pain Goal: 7 ([id number])  Complications: No apparent anesthesia complications

## 2015-07-01 NOTE — H&P (Signed)
The patient was re examined and there is no change in the patients condition since the original H and P. 

## 2015-07-01 NOTE — Op Note (Signed)
Patient brought to the operating room and prepped and draped in the usual manner.  Lid speculum inserted in left eye.  Stab incision made at the twelve o'clock position.  Provisc instilled in the anterior chamber.   A 2.4 mm. Stab incision was made temporally.  An anterior capsulotomy was done with a bent 25 gauge needle.  The nucleus was hydrodissected.  The Phaco tip was inserted in the anterior chamber and the nucleus was emulsified.  CDE was 4.16.  The cortical material was then removed with the I and A tip.  Posterior capsule was the polished.  The anterior chamber was deepened with Provisc.  A 19.5 Diopter Alcon [id number] IOL was then inserted in the capsular bag.  Provisc was then removed with the I and A tip.  The wound was then hydrated.  Patient sent to the Recovery Room in good condition with follow up in my office.  Preoperative Diagnosis:  Nuclear and PSC Cataract OS Postoperative Diagnosis:  Same Procedure name: Kelman Phacoemulsification OS with IOL

## 2015-07-02 ENCOUNTER — Encounter (HOSPITAL_COMMUNITY): Payer: Self-pay | Admitting: Ophthalmology

## 2015-07-28 DIAGNOSIS — G894 Chronic pain syndrome: Secondary | ICD-10-CM | POA: Diagnosis not present

## 2015-07-28 DIAGNOSIS — Z6827 Body mass index (BMI) 27.0-27.9, adult: Secondary | ICD-10-CM | POA: Diagnosis not present

## 2015-07-28 DIAGNOSIS — E291 Testicular hypofunction: Secondary | ICD-10-CM | POA: Diagnosis not present

## 2015-07-28 DIAGNOSIS — K219 Gastro-esophageal reflux disease without esophagitis: Secondary | ICD-10-CM | POA: Diagnosis not present

## 2015-07-28 DIAGNOSIS — F419 Anxiety disorder, unspecified: Secondary | ICD-10-CM | POA: Diagnosis not present

## 2015-08-07 ENCOUNTER — Other Ambulatory Visit (HOSPITAL_COMMUNITY): Payer: Self-pay | Admitting: Psychiatry

## 2015-08-21 DIAGNOSIS — F419 Anxiety disorder, unspecified: Secondary | ICD-10-CM | POA: Diagnosis not present

## 2015-08-21 DIAGNOSIS — M5416 Radiculopathy, lumbar region: Secondary | ICD-10-CM | POA: Diagnosis not present

## 2015-08-21 DIAGNOSIS — Z1389 Encounter for screening for other disorder: Secondary | ICD-10-CM | POA: Diagnosis not present

## 2015-08-21 DIAGNOSIS — G894 Chronic pain syndrome: Secondary | ICD-10-CM | POA: Diagnosis not present

## 2015-08-21 DIAGNOSIS — Z6825 Body mass index (BMI) 25.0-25.9, adult: Secondary | ICD-10-CM | POA: Diagnosis not present

## 2015-08-21 DIAGNOSIS — F17290 Nicotine dependence, other tobacco product, uncomplicated: Secondary | ICD-10-CM | POA: Diagnosis not present

## 2015-08-21 DIAGNOSIS — Z719 Counseling, unspecified: Secondary | ICD-10-CM | POA: Diagnosis not present

## 2015-08-21 DIAGNOSIS — E663 Overweight: Secondary | ICD-10-CM | POA: Diagnosis not present

## 2015-10-29 ENCOUNTER — Inpatient Hospital Stay (HOSPITAL_COMMUNITY)
Admission: EM | Admit: 2015-10-29 | Discharge: 2015-10-31 | DRG: 247 | Disposition: A | Discharge status: Home / Self Care | Payer: Commercial Managed Care - HMO | Attending: Interventional Cardiology | Admitting: Interventional Cardiology

## 2015-10-29 ENCOUNTER — Encounter (HOSPITAL_COMMUNITY)
Admission: EM | Disposition: A | Discharge status: Home / Self Care | Payer: Self-pay | Source: Home / Self Care | Attending: Interventional Cardiology

## 2015-10-29 DIAGNOSIS — G8929 Other chronic pain: Secondary | ICD-10-CM | POA: Diagnosis not present

## 2015-10-29 DIAGNOSIS — Z79891 Long term (current) use of opiate analgesic: Secondary | ICD-10-CM | POA: Diagnosis not present

## 2015-10-29 DIAGNOSIS — F319 Bipolar disorder, unspecified: Secondary | ICD-10-CM | POA: Diagnosis present

## 2015-10-29 DIAGNOSIS — Z7982 Long term (current) use of aspirin: Secondary | ICD-10-CM

## 2015-10-29 DIAGNOSIS — Z79899 Other long term (current) drug therapy: Secondary | ICD-10-CM

## 2015-10-29 DIAGNOSIS — Z9842 Cataract extraction status, left eye: Secondary | ICD-10-CM | POA: Diagnosis not present

## 2015-10-29 DIAGNOSIS — Z720 Tobacco use: Secondary | ICD-10-CM | POA: Diagnosis present

## 2015-10-29 DIAGNOSIS — F419 Anxiety disorder, unspecified: Secondary | ICD-10-CM | POA: Diagnosis present

## 2015-10-29 DIAGNOSIS — I219 Acute myocardial infarction, unspecified: Secondary | ICD-10-CM | POA: Diagnosis not present

## 2015-10-29 DIAGNOSIS — I959 Hypotension, unspecified: Secondary | ICD-10-CM | POA: Diagnosis not present

## 2015-10-29 DIAGNOSIS — Z961 Presence of intraocular lens: Secondary | ICD-10-CM | POA: Diagnosis present

## 2015-10-29 DIAGNOSIS — R55 Syncope and collapse: Secondary | ICD-10-CM | POA: Diagnosis not present

## 2015-10-29 DIAGNOSIS — I2510 Atherosclerotic heart disease of native coronary artery without angina pectoris: Secondary | ICD-10-CM | POA: Diagnosis present

## 2015-10-29 DIAGNOSIS — M549 Dorsalgia, unspecified: Secondary | ICD-10-CM | POA: Diagnosis not present

## 2015-10-29 DIAGNOSIS — I2119 ST elevation (STEMI) myocardial infarction involving other coronary artery of inferior wall: Secondary | ICD-10-CM | POA: Diagnosis not present

## 2015-10-29 DIAGNOSIS — E7800 Pure hypercholesterolemia, unspecified: Secondary | ICD-10-CM | POA: Diagnosis present

## 2015-10-29 DIAGNOSIS — E782 Mixed hyperlipidemia: Secondary | ICD-10-CM

## 2015-10-29 DIAGNOSIS — I213 ST elevation (STEMI) myocardial infarction of unspecified site: Secondary | ICD-10-CM | POA: Diagnosis not present

## 2015-10-29 DIAGNOSIS — I2111 ST elevation (STEMI) myocardial infarction involving right coronary artery: Secondary | ICD-10-CM

## 2015-10-29 DIAGNOSIS — I255 Ischemic cardiomyopathy: Secondary | ICD-10-CM | POA: Diagnosis present

## 2015-10-29 DIAGNOSIS — R079 Chest pain, unspecified: Secondary | ICD-10-CM | POA: Diagnosis present

## 2015-10-29 DIAGNOSIS — Z955 Presence of coronary angioplasty implant and graft: Secondary | ICD-10-CM

## 2015-10-29 HISTORY — PX: CARDIAC CATHETERIZATION: SHX172

## 2015-10-29 HISTORY — PX: PERIPHERAL VASCULAR CATHETERIZATION: SHX172C

## 2015-10-29 LAB — POCT I-STAT, CHEM 8
BUN: 4 mg/dL — AB (ref 6–20)
CHLORIDE: 105 mmol/L (ref 101–111)
CREATININE: 1 mg/dL (ref 0.61–1.24)
Calcium, Ion: 1.2 mmol/L (ref 1.15–1.40)
Glucose, Bld: 108 mg/dL — ABNORMAL HIGH (ref 65–99)
HCT: 36 % — ABNORMAL LOW (ref 39.0–52.0)
Hemoglobin: 12.2 g/dL — ABNORMAL LOW (ref 13.0–17.0)
POTASSIUM: 3.2 mmol/L — AB (ref 3.5–5.1)
SODIUM: 141 mmol/L (ref 135–145)
TCO2: 21 mmol/L (ref 0–100)

## 2015-10-29 LAB — CK TOTAL AND CKMB (NOT AT ARMC)
CK TOTAL: 1101 U/L — AB (ref 49–397)
CK, MB: 116.9 ng/mL — AB (ref 0.5–5.0)
RELATIVE INDEX: 10.6 — AB (ref 0.0–2.5)

## 2015-10-29 LAB — POCT ACTIVATED CLOTTING TIME
ACTIVATED CLOTTING TIME: 263 s
Activated Clotting Time: 230 seconds
Activated Clotting Time: 318 seconds

## 2015-10-29 LAB — MRSA PCR SCREENING: MRSA BY PCR: NEGATIVE

## 2015-10-29 LAB — GLUCOSE, CAPILLARY: GLUCOSE-CAPILLARY: 101 mg/dL — AB (ref 65–99)

## 2015-10-29 SURGERY — LEFT HEART CATH AND CORONARY ANGIOGRAPHY

## 2015-10-29 MED ORDER — ASPIRIN 81 MG PO CHEW
CHEWABLE_TABLET | ORAL | Status: AC
  Filled 2015-10-29: qty 3

## 2015-10-29 MED ORDER — HEPARIN SODIUM (PORCINE) 1000 UNIT/ML IJ SOLN
INTRAMUSCULAR | Status: AC
Start: 2015-10-29 — End: 2015-10-29
  Filled 2015-10-29: qty 1

## 2015-10-29 MED ORDER — MIRTAZAPINE 15 MG PO TABS
15.0000 mg | ORAL_TABLET | Freq: Every day | ORAL | Status: DC
  Administered 2015-10-29 – 2015-10-30 (×2): 15 mg via ORAL
  Filled 2015-10-29 (×3): qty 1

## 2015-10-29 MED ORDER — METOPROLOL TARTRATE 5 MG/5ML IV SOLN
INTRAVENOUS | Status: AC
  Filled 2015-10-29: qty 5

## 2015-10-29 MED ORDER — HEPARIN SODIUM (PORCINE) 1000 UNIT/ML IJ SOLN
INTRAMUSCULAR | Status: DC | PRN
  Administered 2015-10-29: 5000 [IU] via INTRAVENOUS
  Administered 2015-10-29: 4000 [IU] via INTRAVENOUS
  Administered 2015-10-29: 3000 [IU] via INTRAVENOUS

## 2015-10-29 MED ORDER — MIDAZOLAM HCL 2 MG/2ML IJ SOLN
INTRAMUSCULAR | Status: DC | PRN
  Administered 2015-10-29: 1 mg via INTRAVENOUS

## 2015-10-29 MED ORDER — IOPAMIDOL (ISOVUE-370) INJECTION 76%
INTRAVENOUS | Status: AC
  Filled 2015-10-29: qty 125

## 2015-10-29 MED ORDER — SODIUM CHLORIDE 0.9% FLUSH: 3.0000 mL | INTRAVENOUS | Status: DC | PRN

## 2015-10-29 MED ORDER — ASPIRIN 81 MG PO CHEW
CHEWABLE_TABLET | ORAL | Status: AC
  Filled 2015-10-29: qty 1

## 2015-10-29 MED ORDER — SODIUM CHLORIDE 0.9 % IV SOLN
INTRAVENOUS | Status: DC | PRN
  Administered 2015-10-29: 500 mL via INTRAVENOUS

## 2015-10-29 MED ORDER — SODIUM CHLORIDE 0.9 % IV SOLN
INTRAVENOUS | Status: DC
  Administered 2015-10-29: 21:00:00 via INTRAVENOUS

## 2015-10-29 MED ORDER — HEPARIN (PORCINE) IN NACL 2-0.9 UNIT/ML-% IJ SOLN
INTRAMUSCULAR | Status: DC | PRN
  Administered 2015-10-29: 1500 mL

## 2015-10-29 MED ORDER — TICAGRELOR 90 MG PO TABS
90.0000 mg | ORAL_TABLET | Freq: Two times a day (BID) | ORAL | Status: DC
  Administered 2015-10-30 – 2015-10-31 (×3): 90 mg via ORAL
  Filled 2015-10-29 (×3): qty 1

## 2015-10-29 MED ORDER — FLUTICASONE PROPIONATE 50 MCG/ACT NA SUSP
2.0000 | Freq: Every day | NASAL | Status: DC
  Administered 2015-10-30: 2 via NASAL
  Filled 2015-10-29: qty 16

## 2015-10-29 MED ORDER — IOPAMIDOL (ISOVUE-370) INJECTION 76%
INTRAVENOUS | Status: AC
  Filled 2015-10-29: qty 50

## 2015-10-29 MED ORDER — GABAPENTIN 300 MG PO CAPS
600.0000 mg | ORAL_CAPSULE | Freq: Two times a day (BID) | ORAL | Status: DC
  Administered 2015-10-29 – 2015-10-31 (×4): 600 mg via ORAL
  Filled 2015-10-29: qty 2
  Filled 2015-10-29: qty 6
  Filled 2015-10-29 (×2): qty 2
  Filled 2015-10-29: qty 6

## 2015-10-29 MED ORDER — ATORVASTATIN CALCIUM 40 MG PO TABS
40.0000 mg | ORAL_TABLET | Freq: Every day | ORAL | Status: DC
  Administered 2015-10-30: 40 mg via ORAL
  Filled 2015-10-29: qty 1

## 2015-10-29 MED ORDER — TIROFIBAN (AGGRASTAT) BOLUS VIA INFUSION
INTRAVENOUS | Status: DC | PRN
  Administered 2015-10-29: 2100 ug via INTRAVENOUS

## 2015-10-29 MED ORDER — ASPIRIN EC 81 MG PO TBEC: 81.0000 mg | DELAYED_RELEASE_TABLET | Freq: Every day | ORAL | Status: DC

## 2015-10-29 MED ORDER — TIROFIBAN HCL IN NACL 5-0.9 MG/100ML-% IV SOLN
INTRAVENOUS | Status: DC | PRN
  Administered 2015-10-29: 0.15 ug/kg/min via INTRAVENOUS

## 2015-10-29 MED ORDER — FENTANYL CITRATE (PF) 100 MCG/2ML IJ SOLN
INTRAMUSCULAR | Status: DC | PRN
  Administered 2015-10-29: 25 ug via INTRAVENOUS

## 2015-10-29 MED ORDER — METOPROLOL TARTRATE 12.5 MG HALF TABLET
12.5000 mg | ORAL_TABLET | Freq: Two times a day (BID) | ORAL | Status: DC
  Administered 2015-10-30 – 2015-10-31 (×2): 12.5 mg via ORAL
  Filled 2015-10-29 (×3): qty 1

## 2015-10-29 MED ORDER — ASPIRIN 81 MG PO CHEW
CHEWABLE_TABLET | ORAL | Status: DC | PRN
  Administered 2015-10-29: 324 mg via ORAL

## 2015-10-29 MED ORDER — TIZANIDINE HCL 4 MG PO TABS
4.0000 mg | ORAL_TABLET | Freq: Three times a day (TID) | ORAL | Status: DC
  Administered 2015-10-29 – 2015-10-31 (×4): 4 mg via ORAL
  Filled 2015-10-29 (×7): qty 1

## 2015-10-29 MED ORDER — SODIUM CHLORIDE 0.9 % IV SOLN: 250.0000 mL | INTRAVENOUS | Status: DC | PRN

## 2015-10-29 MED ORDER — IOPAMIDOL (ISOVUE-370) INJECTION 76%
INTRAVENOUS | Status: DC | PRN
  Administered 2015-10-29: 170 mL via INTRA_ARTERIAL

## 2015-10-29 MED ORDER — ALPRAZOLAM 0.5 MG PO TABS
1.0000 mg | ORAL_TABLET | Freq: Three times a day (TID) | ORAL | Status: DC | PRN
  Administered 2015-10-29: 1 mg via ORAL
  Filled 2015-10-29: qty 2

## 2015-10-29 MED ORDER — SODIUM CHLORIDE 0.9% FLUSH
3.0000 mL | Freq: Two times a day (BID) | INTRAVENOUS | Status: DC
  Administered 2015-10-30 – 2015-10-31 (×3): 3 mL via INTRAVENOUS

## 2015-10-29 MED ORDER — TICAGRELOR 90 MG PO TABS
ORAL_TABLET | ORAL | Status: DC | PRN
  Administered 2015-10-29: 180 mg via ORAL

## 2015-10-29 MED ORDER — LIDOCAINE HCL (PF) 1 % IJ SOLN
INTRAMUSCULAR | Status: AC
  Filled 2015-10-29: qty 30

## 2015-10-29 MED ORDER — FENTANYL CITRATE (PF) 100 MCG/2ML IJ SOLN
INTRAMUSCULAR | Status: AC
  Filled 2015-10-29: qty 2

## 2015-10-29 MED ORDER — ASPIRIN 81 MG PO CHEW
81.0000 mg | CHEWABLE_TABLET | Freq: Every day | ORAL | Status: DC
  Administered 2015-10-30 – 2015-10-31 (×2): 81 mg via ORAL
  Filled 2015-10-29 (×2): qty 1

## 2015-10-29 MED ORDER — LIDOCAINE HCL (PF) 1 % IJ SOLN
INTRAMUSCULAR | Status: DC | PRN
  Administered 2015-10-29: 2 mL via INTRADERMAL

## 2015-10-29 MED ORDER — NITROGLYCERIN 1 MG/10 ML FOR IR/CATH LAB
INTRA_ARTERIAL | Status: DC | PRN
  Administered 2015-10-29: 200 ug via INTRA_ARTERIAL

## 2015-10-29 MED ORDER — ZOLPIDEM TARTRATE 5 MG PO TABS: 10.0000 mg | ORAL_TABLET | Freq: Every evening | ORAL | Status: DC | PRN

## 2015-10-29 MED ORDER — HEPARIN (PORCINE) IN NACL 2-0.9 UNIT/ML-% IJ SOLN
INTRAMUSCULAR | Status: AC
  Filled 2015-10-29: qty 1000

## 2015-10-29 MED ORDER — VERAPAMIL HCL 2.5 MG/ML IV SOLN
INTRAVENOUS | Status: DC | PRN
  Administered 2015-10-29: 10 mL via INTRA_ARTERIAL

## 2015-10-29 MED ORDER — MIDAZOLAM HCL 2 MG/2ML IJ SOLN
INTRAMUSCULAR | Status: AC
  Filled 2015-10-29: qty 2

## 2015-10-29 MED ORDER — ADULT MULTIVITAMIN W/MINERALS CH
1.0000 | ORAL_TABLET | Freq: Every day | ORAL | Status: DC
  Administered 2015-10-30 – 2015-10-31 (×2): 1 via ORAL
  Filled 2015-10-29 (×2): qty 1

## 2015-10-29 MED ORDER — HYDROCODONE-ACETAMINOPHEN 5-325 MG PO TABS: 1.0000 | ORAL_TABLET | ORAL | Status: DC | PRN

## 2015-10-29 MED ORDER — TIROFIBAN HCL IN NACL 5-0.9 MG/100ML-% IV SOLN
0.1500 ug/kg/min | INTRAVENOUS | Status: DC
  Administered 2015-10-29: 0.15 ug/kg/min via INTRAVENOUS

## 2015-10-29 MED ORDER — TIROFIBAN HCL IN NACL 5-0.9 MG/100ML-% IV SOLN
0.1500 ug/kg/min | INTRAVENOUS | Status: DC
  Administered 2015-10-29: 0.15 ug/kg/min via INTRAVENOUS
  Filled 2015-10-29: qty 100

## 2015-10-29 MED ORDER — NITROGLYCERIN 1 MG/10 ML FOR IR/CATH LAB
INTRA_ARTERIAL | Status: AC
  Filled 2015-10-29: qty 10

## 2015-10-29 MED ORDER — HEPARIN SODIUM (PORCINE) 1000 UNIT/ML IJ SOLN
INTRAMUSCULAR | Status: AC
  Filled 2015-10-29: qty 1

## 2015-10-29 MED ORDER — ONDANSETRON HCL 4 MG/2ML IJ SOLN: 4.0000 mg | Freq: Four times a day (QID) | INTRAMUSCULAR | Status: DC | PRN

## 2015-10-29 MED ORDER — TICAGRELOR 90 MG PO TABS
ORAL_TABLET | ORAL | Status: AC
  Filled 2015-10-29: qty 2

## 2015-10-29 MED ORDER — METOPROLOL TARTRATE 5 MG/5ML IV SOLN
INTRAVENOUS | Status: DC | PRN
  Administered 2015-10-29 (×3): 2.5 mg via INTRAVENOUS

## 2015-10-29 MED ORDER — ACETAMINOPHEN 325 MG PO TABS: 650.0000 mg | ORAL_TABLET | ORAL | Status: DC | PRN

## 2015-10-29 SURGICAL SUPPLY — 28 items
BALLN EMERGE MR 2.25X15 (BALLOONS) ×4
BALLN EMERGE MR 2.5X12 (BALLOONS) ×4
BALLN EMERGE MR 2.5X15 (BALLOONS) ×4
BALLN ~~LOC~~ EMERGE MR 2.5X8 (BALLOONS) ×4
BALLN ~~LOC~~ EMERGE MR 3.0X8 (BALLOONS) ×4
BALLN ~~LOC~~ EUPHORA RX 2.75X12 (BALLOONS) ×4
BALLOON EMERGE MR 2.25X15 (BALLOONS) ×2 IMPLANT
BALLOON EMERGE MR 2.5X12 (BALLOONS) ×2 IMPLANT
BALLOON EMERGE MR 2.5X15 (BALLOONS) ×2 IMPLANT
BALLOON ~~LOC~~ EMERGE MR 2.5X8 (BALLOONS) ×2 IMPLANT
BALLOON ~~LOC~~ EMERGE MR 3.0X8 (BALLOONS) ×2 IMPLANT
BALLOON ~~LOC~~ EUPHORA RX 2.75X12 (BALLOONS) ×2 IMPLANT
CATH EXTRAC PRONTO 5.5F 138CM (CATHETERS) ×4 IMPLANT
CATH INFINITI 5 FR JL3.5 (CATHETERS) ×4 IMPLANT
DEVICE RAD COMP TR BAND LRG (VASCULAR PRODUCTS) ×4 IMPLANT
GLIDESHEATH SLEND SS 6F .021 (SHEATH) ×4 IMPLANT
GUIDE CATH RUNWAY 6FR FR4 (CATHETERS) ×4 IMPLANT
KIT ENCORE 26 ADVANTAGE (KITS) ×8 IMPLANT
KIT HEART LEFT (KITS) ×4 IMPLANT
PACK CARDIAC CATHETERIZATION (CUSTOM PROCEDURE TRAY) ×4 IMPLANT
STENT SYNERGY DES 2.5X16 (Permanent Stent) ×4 IMPLANT
STENT SYNERGY DES 2.75X16 (Permanent Stent) ×4 IMPLANT
TRANSDUCER W/STOPCOCK (MISCELLANEOUS) ×4 IMPLANT
TUBING CIL FLEX 10 FLL-RA (TUBING) ×4 IMPLANT
VALVE GUARDIAN II ~~LOC~~ HEMO (MISCELLANEOUS) ×4 IMPLANT
WIRE ASAHI PROWATER 180CM (WIRE) ×8 IMPLANT
WIRE HI TORQ BMW 190CM (WIRE) ×4 IMPLANT
WIRE SAFE-T 1.5MM-J .035X260CM (WIRE) ×4 IMPLANT

## 2015-10-29 NOTE — H&P (Signed)
Corey [name] is an 65 y.o. male.   Primary Cardiologist: new PMD:  Chief Complaint:  Chest pain, syncope HPI: 65 year old man with a history of tobacco abuse and high cholesterol. He had been feeling well, and his usual state of health. He was driving to [employer] in [place] and felt some pressure in his chest. He then blacked out. EMS was called and initial ECG revealed bradycardia with inferior ST elevation.  He was transported to [facility] for emergent cardiac catheterization. Upon arrival, his pain was 5 out of 10. His blood pressure was stable in the [id number] systolic range. When EMS first saw him, he was quite hypotensive.  Past Medical History:  Diagnosis Date  . Anxiety   . Arthritis   . Bipolar disorder ([place])   . Depression   . High cholesterol   . Iron deficiency anemia 12/09/2011  . Migraine   . Tinnitus     Past Surgical History:  Procedure Laterality Date  . BACK SURGERY    . CATARACT EXTRACTION W/PHACO Left 07/01/2015   Procedure: CATARACT EXTRACTION PHACO AND INTRAOCULAR LENS PLACEMENT (IOC);  Surgeon: Rutherford Guys, MD;  Location: [facility];  Service: Ophthalmology;  Laterality: Left;  CDE: 4.16  . ESOPHAGOGASTRODUODENOSCOPY  01/27/2012   Procedure: ESOPHAGOGASTRODUODENOSCOPY (EGD);  Surgeon: Rogene Houston, MD;  Location: [facility] END[facility] SUITE;  Service: Endoscopy;  Laterality: N/A;  350-rescheduled to 9:15 Ann notitied pt  . HAND SURGERY Bilateral   . HERNIA REPAIR    . INSERTION OF MESH N/A 12/14/2013   Procedure: INSERTION OF MESH;  Surgeon: Jamesetta So, MD;  Location: [facility];  Service: General;  Laterality: N/A;  . MASS EXCISION Left 07/14/2012   Procedure: EXCISION SOFT TISSUE MASS LEG;  Surgeon: Jamesetta So, MD;  Location: [facility];  Service: General;  Laterality: Left;  . ROTATOR CUFF REPAIR Bilateral   . UMBILICAL HERNIA REPAIR N/A 12/14/2013   Procedure: UMBILICAL HERNIORRHAPHY;  Surgeon: Jamesetta So, MD;  Location: [facility];  Service: General;  Laterality:  N/A;    No family history on file. Social History:  reports that he has quit smoking. He does not have any smokeless tobacco history on file. He reports that he does not drink alcohol or use drugs.  Allergies: No Known Allergies  Medications Prior to Admission  Medication Sig Dispense Refill  . ALPRAZolam (XANAX) 1 MG tablet Take 1 tablet by mouth 3 (three) times daily as needed for anxiety.   1  . aspirin EC 81 MG tablet Take 1 tablet (81 mg total) by mouth daily.    . fluticasone (FLONASE) 50 MCG/ACT nasal spray Place 2 sprays into both nostrils daily.    Marland Kitchen gabapentin (NEURONTIN) 300 MG capsule Take 600 mg by mouth 2 (two) times daily.     Marland Kitchen HYDROcodone-acetaminophen (NORCO/VICODIN) 5-325 MG tablet Take 1 tablet by mouth every 4 (four) hours as needed for moderate pain.   0  . meloxicam (MOBIC) 7.5 MG tablet Take 7.5 mg by mouth daily.    . mirtazapine (REMERON) 15 MG tablet Take 1 tablet (15 mg total) by mouth at bedtime. 30 tablet 0  . Multiple Vitamin (MULTIVITAMIN WITH MINERALS) TABS tablet Take 1 tablet by mouth daily. Centrum Silver    . pantoprazole (PROTONIX) 40 MG tablet TAKE 1 TABLET BY MOUTH EVERY DAY 30 MINUTES BEFORE BREAKFAST 90 tablet 3  . simvastatin (ZOCOR) 80 MG tablet Take 80 mg by mouth daily.    Marland Kitchen tiZANidine (ZANAFLEX) 4 MG  tablet Take 4 mg by mouth 3 (three) times daily.     Marland Kitchen zolpidem (AMBIEN) 10 MG tablet Take 1 tablet by mouth at bedtime as needed for sleep.       Results for orders placed or performed during the hospital encounter of 10/29/15 (from the past 48 hour(s))  I-STAT, chem 8     Status: Abnormal   Collection Time: 10/29/15  6:28 PM  Result Value Ref Range   Sodium 141 135 - 145 mmol/L   Potassium 3.2 (L) 3.5 - 5.1 mmol/L   Chloride 105 101 - 111 mmol/L   BUN 4 (L) 6 - 20 mg/dL   Creatinine, Ser 1.00 0.61 - 1.24 mg/dL   Glucose, Bld 108 (H) 65 - 99 mg/dL   Calcium, Ion 1.20 1.15 - 1.40 mmol/L   TCO2 21 0 - 100 mmol/L   Hemoglobin 12.2 (L) 13.0  - 17.0 g/dL   HCT 36.0 (L) 39.0 - 52.0 %  POCT Activated clotting time     Status: None   Collection Time: 10/29/15  6:33 PM  Result Value Ref Range   Activated Clotting Time 318 seconds  POCT Activated clotting time     Status: None   Collection Time: 10/29/15  7:02 PM  Result Value Ref Range   Activated Clotting Time 230 seconds  POCT Activated clotting time     Status: None   Collection Time: 10/29/15  7:19 PM  Result Value Ref Range   Activated Clotting Time 263 seconds   No results found.  ROS: Syncope, chest pain starting today. Chronic back pain. All other systems negative.  OBJECTIVE:   Vitals:   Vitals:   10/29/15 1934 10/29/15 1939 10/29/15 1944 10/29/15 1949  BP: 116/86 125/89 122/88 (!) 121/91  Pulse: 95 87 90 (!) 0  Resp: 13 12 14  (!) 0  SpO2: 99% 99% 100% (!) 0%   I&O's:  No intake or output data in the 24 hours ending 10/29/15 2020      PHYSICAL EXAM General: Well developed, well nourished, in no acute distress Head:   Normal cephalic and atramatic  Lungs:   Clear bilaterally to auscultation. Heart:   HRRR S1 S2  No JVD.  No murmurs Abdomen: abdomen soft and non-tender Msk:  Back normal,  Normal strength and tone for age. Extremities:   No edema.  1+ right radial pulse Neuro: Alert and oriented. Psych:  Normal affect, responds appropriately  LABS: Basic Metabolic Panel:  Recent Labs  10/29/15 1828  NA 141  K 3.2*  CL 105  GLUCOSE 108*  BUN 4*  CREATININE 1.00   Liver Function Tests: No results for input(s): AST, ALT, ALKPHOS, BILITOT, PROT, ALBUMIN in the last 72 hours. No results for input(s): LIPASE, AMYLASE in the last 72 hours. CBC:  Recent Labs  10/29/15 1828  HGB 12.2*  HCT 36.0*   Cardiac Enzymes: No results for input(s): CKTOTAL, CKMB, CKMBINDEX, TROPONINI in the last 72 hours. BNP: Invalid input(s): POCBNP D-Dimer: No results for input(s): DDIMER in the last 72 hours. Hemoglobin A1C: No results for input(s): HGBA1C in  the last 72 hours. Fasting Lipid Panel: No results for input(s): CHOL, HDL, LDLCALC, TRIG, CHOLHDL, LDLDIRECT in the last 72 hours. Thyroid Function Tests: No results for input(s): TSH, T4TOTAL, T3FREE, THYROIDAB in the last 72 hours.  Invalid input(s): FREET3 Anemia Panel: No results for input(s): VITAMINB12, FOLATE, FERRITIN, TIBC, IRON, RETICCTPCT in the last 72 hours. Coag Panel:   Lab Results  Component  Value Date   INR 0.98 01/24/2013      ECG: Complete heart block with inferolateral ST elevation  Assessment/Plan Acute inferolateral ST elevation MI:  I personally reviewed the ECG and made the decision to bring him to the Cath Lab. Emergent catheterization revealed severe disease at the bifurcation of his posterior lateral artery and posterior descending artery and the distal RCA. He was treated with bifurcation stents and a culotte fashion. Continue dual antiplatelet therapy, efforts at smoking cessation and a high potency statin. Rhythm abnormality has improved. Will start beta blocker.  Moderate to severe disease on the left system. He had no symptoms of angina prior to today's events. He is active. He plays golf regularly.  Would likely allow him to recover from this event prior to any attempt at intervention on the left system.  He'll be watched in the ICU.  Larae Grooms 10/29/2015, 8:20 PM

## 2015-10-30 ENCOUNTER — Inpatient Hospital Stay (HOSPITAL_COMMUNITY): Payer: Commercial Managed Care - HMO

## 2015-10-30 ENCOUNTER — Encounter (HOSPITAL_COMMUNITY): Payer: Self-pay | Admitting: Interventional Cardiology

## 2015-10-30 DIAGNOSIS — Z720 Tobacco use: Secondary | ICD-10-CM

## 2015-10-30 DIAGNOSIS — E7800 Pure hypercholesterolemia, unspecified: Secondary | ICD-10-CM

## 2015-10-30 DIAGNOSIS — I2119 ST elevation (STEMI) myocardial infarction involving other coronary artery of inferior wall: Principal | ICD-10-CM

## 2015-10-30 DIAGNOSIS — R55 Syncope and collapse: Secondary | ICD-10-CM | POA: Diagnosis present

## 2015-10-30 LAB — CK TOTAL AND CKMB (NOT AT ARMC)
CK, MB: 124.8 ng/mL — ABNORMAL HIGH (ref 0.5–5.0)
CK, MB: 84.2 ng/mL — AB (ref 0.5–5.0)
Relative Index: 10.1 — ABNORMAL HIGH (ref 0.0–2.5)
Relative Index: 8.4 — ABNORMAL HIGH (ref 0.0–2.5)
Total CK: 1240 U/L — ABNORMAL HIGH (ref 49–397)
Total CK: 1001 U/L — ABNORMAL HIGH (ref 49–397)

## 2015-10-30 LAB — BASIC METABOLIC PANEL
ANION GAP: 12 (ref 5–15)
BUN: <5 mg/dL — ABNORMAL LOW (ref 6–20)
CALCIUM: 9.2 mg/dL (ref 8.9–10.3)
CHLORIDE: 106 mmol/L (ref 101–111)
CO2: 23 mmol/L (ref 22–32)
Creatinine, Ser: 1 mg/dL (ref 0.61–1.24)
GFR calc non Af Amer: >60 mL/min (ref >60)
Glucose, Bld: 94 mg/dL (ref 65–99)
Potassium: 3.7 mmol/L (ref 3.5–5.1)
Sodium: 141 mmol/L (ref 135–145)

## 2015-10-30 LAB — LIPID PANEL
CHOL/HDL RATIO: 4.5 ratio
CHOLESTEROL: 138 mg/dL (ref 0–200)
HDL: 31 mg/dL — ABNORMAL LOW (ref >40)
LDL CALC: 70 mg/dL (ref 0–99)
TRIGLYCERIDES: 183 mg/dL — AB (ref <150)
VLDL: 37 mg/dL (ref 0–40)

## 2015-10-30 LAB — CBC
HCT: 37.6 % — ABNORMAL LOW (ref 39.0–52.0)
HEMOGLOBIN: 12.1 g/dL — AB (ref 13.0–17.0)
MCH: 26 pg (ref 26.0–34.0)
MCHC: 32.2 g/dL (ref 30.0–36.0)
MCV: 80.7 fL (ref 78.0–100.0)
Platelets: 240 10*3/uL (ref 150–400)
RBC: 4.66 MIL/uL (ref 4.22–5.81)
RDW: 17.6 % — ABNORMAL HIGH (ref 11.5–15.5)
WBC: 11.7 10*3/uL — ABNORMAL HIGH (ref 4.0–10.5)

## 2015-10-30 NOTE — Progress Notes (Signed)
CARDIAC REHAB PHASE I   PRE:  Rate/Rhythm: 72 SR  BP:  Sitting: 93/65        SaO2: 100 RA  MODE:  Ambulation: 700 ft   POST:  Rate/Rhythm: 64 SR  BP:  Sitting: 101/62         SaO2: 100 RA  Pt ambulated 700 ft on RA, handheld assist, steady gait, tolerated well with no complaints. Completed MI/stent education.  Reviewed risk factors, tobacco cessation (gave pt fake cigarette-pt motivated to quit), MI book, anti-platelet therapy, stent card, activity restrictions, ntg, exercise, heart healthy diet and phase 2 cardiac rehab. Pt verbalized understanding, receptive to education. Pt agrees to phase 2 cardiac rehab referral, will send to [facility] per pt request. Pt to recliner after walk, call bell within reach. Will follow-up tomorrow.    [id number] Lenna Sciara, RN, BSN 10/30/2015 11:23 AM

## 2015-10-30 NOTE — Care Management Note (Addendum)
Case Management Note  Patient Details  Name: Corey [name] MRN: [id number] Date of Birth: 04/03/1950  Subjective/Objective:        Adm w mi            Action/Plan: lives at home, pcp dr fusco   Expected Discharge Date:                  Expected Discharge Plan:  Home/Self Care  In-House Referral:     Discharge planning Services  CM Consult, Medication Assistance  Post Acute Care Choice:    Choice offered to:     DME Arranged:    DME Agency:     HH Arranged:    HH Agency:     Status of Service:     If discussed at [employer], dates discussed:    Additional Comments:gave pt 30day free brilinta card.has human medicare ins for meds.S/W KALLI @ [employer] RX # [phone]   BRILINTA 90 MG BID ( 30 ) 60 TAB   COVER- YES  CO-PAY- $47.00  TIER- 3 DRUG  PRIOR APPROVAL- NO  PHARMACY : Salomon Fick, RN 10/30/2015, 10:02 AM

## 2015-10-30 NOTE — Progress Notes (Signed)
Patient has arrived on unit from [facility] with RN.pm med was not given or brought with him did not get med and it will be given to close to next dose.   Patient oriented to unit, assessed, placed on tele, VS were stable.

## 2015-10-30 NOTE — Progress Notes (Signed)
TELEMETRY: Reviewed telemetry pt in NSR with rare PVC: Vitals:   10/30/15 0300 10/30/15 0400 10/30/15 0500 10/30/15 0600  BP: 99/66 (!) 90/56 100/80 95/62  Pulse: (!) 55 (!) 57 61 (!) 56  Resp: 20 10 19 18   Temp:  98.2 F (36.8 C)    TempSrc:  Oral    SpO2: 97% 97% 96% 98%  Weight:      Height:        Intake/Output Summary (Last 24 hours) at 10/30/15 0802 Last data filed at 10/30/15 0100  Gross per 24 hour  Intake           562.46 ml  Output              625 ml  Net           -62.54 ml   Filed Weights   10/29/15 2000 10/29/15 2011  Weight: 173 lb 1 oz (78.5 kg) 184 lb 1.4 oz (83.5 kg)    Subjective  Feels great this am. No chest pain, SOB, or dizziness.  Marland Kitchen aspirin  81 mg Oral Daily  . atorvastatin  40 mg Oral q1800  . fluticasone  2 spray Each Nare Daily  . gabapentin  600 mg Oral BID  . metoprolol tartrate  12.5 mg Oral BID  . mirtazapine  15 mg Oral QHS  . multivitamin with minerals  1 tablet Oral Daily  . sodium chloride flush  3 mL Intravenous Q12H  . ticagrelor  90 mg Oral BID  . tiZANidine  4 mg Oral TID      LABS: Basic Metabolic Panel:  Recent Labs  10/29/15 1828 10/30/15 0437  NA 141 141  K 3.2* 3.7  CL 105 106  CO2  --  23  GLUCOSE 108* 94  BUN 4* <5*  CREATININE 1.00 1.00  CALCIUM  --  9.2   Liver Function Tests: No results for input(s): AST, ALT, ALKPHOS, BILITOT, PROT, ALBUMIN in the last 72 hours. No results for input(s): LIPASE, AMYLASE in the last 72 hours. CBC:  Recent Labs  10/29/15 1828 10/30/15 0437  WBC  --  11.7*  HGB 12.2* 12.1*  HCT 36.0* 37.6*  MCV  --  80.7  PLT  --  240   Cardiac Enzymes:  Recent Labs  10/29/15 2123 10/30/15 0437  CKTOTAL 1,101* 1,240*  CKMB 116.9* 124.8*   BNP: No results for input(s): PROBNP in the last 72 hours. D-Dimer: No results for input(s): DDIMER in the last 72 hours. Hemoglobin A1C: No results for input(s): HGBA1C in the last 72 hours. Fasting Lipid Panel:  Recent Labs  10/30/15 0437  CHOL 138  HDL 31*  LDLCALC 70  TRIG 183*  CHOLHDL 4.5   Thyroid Function Tests: No results for input(s): TSH, T4TOTAL, T3FREE, THYROIDAB in the last 72 hours.  Invalid input(s): FREET3   Radiology/Studies:  No results found.   Ecg today shows NSR with inferior infarct with Q waves. ST elevation resolved.  Procedures   Coronary Stent Intervention  Left Heart Cath and Coronary Angiography  Thrombectomy  Conclusion     Mid LAD lesion, 75 %stenosed. This is a small vessel that does not reach the apex. Scattered disease in the left system.  The left ventricular ejection fraction is 35-45% by visual estimate.  There is mild to moderate left ventricular systolic dysfunction.  There is no aortic valve stenosis.  Ost RPDA lesion, 90 %stenosed. A STENT SYNERGY DES 2.5X16 drug eluting stent was successfully placed.  Post intervention, there is a 0% residual stenosis.  Distal RCA/posterolateral artery, 100 %stenosed. A STENT SYNERGY DES [id number] drug eluting stent was successfully placed.  Post intervention, there is a 0% residual stenosis.  Stents were placed in Cullotte fashion. Final kissing balloon inflation was performed.   Continue aggressive medical therapy including smoking cessation. He'll be watched in the ICU. Will allow him to recover from the semi-before addressing the left-sided disease.  He'll need dual antiplatelet therapy for a year. Beyond this, he'll likely need lifelong clopidogrel therapy.   Indications   Acute MI, inferolateral wall, initial episode of care ([place]) [I21.19 (ICD-10-CM)]  Procedural Details/Technique   Technical Details The risks, benefits, and details of the procedure were explained to the patient. The patient verbalized understanding and wanted to proceed. Informed written consent was obtained.  PROCEDURE TECHNIQUE: After Xylocaine anesthesia a 1F slender sheath was placed in the right radial artery with a single anterior  needle wall stick. IV Heparin was given. Right coronary angiography was done using a Judkins R4 guide catheter. Left coronary angiography was done using a Judkins L3.5 guide catheter. Left ventriculography was done using a JR4 catheter. A TR band was used for hemostasis.  IV heparin and IV tirofiban were used for the intervention. A pro-water wire was placed across the complete occlusion in the distal RCA going into the posterior lateral artery. Thrombectomy was performed with a Pronto catheter. Large volume of thrombus was removed. This restored TIMI 3 flow. Disease was present at the ostial PDA as well. This was a [place] 1, 1, 1 bifurcation. Kissing balloon inflation was then performed with a 2.5 and a 2.25 balloons. The 2.5 balloon was in the posterior lateral artery and the 2.25 balloon was in the PDA. There was significant improvement in flow. The ostial PDA was then stented with a 2.5 x 16 Synergy stent with a stent extending back into the distal RCA. The pro-water wire in the posterior lateral artery was then removed and then repeat placed through the stent struts. There was difficulty in rewiring so a 3.0 balloon was used to post dilate the proximal portion of the distal RCA/PDA stent. Rewiring was then performed. A 2.5 balloon was used to predilate the stent strut. The 2.75 x 16 Synergy drug-eluting stent was then advanced through the stent struts into the posterior lateral artery. This stent was deployed with stent extending back from the PLA into the distal RCA. The stent was postdilated with a 3.0 noncompliant balloon proximally. A 2.5 balloon was then advanced into the posterior descending artery. Final kissing balloon inflation was performed with a 3.0 in the distal right/posterior lateral artery, and a 2.5 noncompliant balloon in the PDA/distal RCA stent. There was an excellent angiographic result with no residual stenosis. The patient tolerated the procedure well.  Contrast: 170  cc    Estimated blood loss <50 mL.  During this procedure the patient was administered the following to achieve and maintain moderate conscious sedation: Versed 1 mg, Fentanyl 25 mcg, while the patient's heart rate, blood pressure, and oxygen saturation were continuously monitored. The period of conscious sedation was 54 minutes, of which I was present face-to-face 100% of this time.    Complications   Complications documented before study signed (10/29/2015 9:02 PM EDT)    No complications were associated with this study.  Documented by Jettie Booze, MD - 10/29/2015 8:54 PM EDT    Coronary Findings   Dominance: Right  Left Anterior Descending  Vessel is  small.  Mid LAD lesion, 75% stenosed.  First Diagonal Branch  Ost 1st Diag to 1st Diag lesion, 80% stenosed.  Second Diagonal [employer] 2nd Diag lesion, 70% stenosed.  Left Circumflex  Vessel is small.  Lateral Second Obtuse Marginal Branch  Lat 2nd Mrg lesion, 80% stenosed.  Right Coronary Artery  Vessel is large.  Prox RCA lesion, 25% stenosed.  Dist RCA lesion, 100% stenosed. The lesion is type C. culprit lesion  Angioplasty: Lesion crossed with guidewire using a WIRE ASAHI PROWATER 180CM. Pre-stent angioplasty was performed using a BALLOON EMERGE MR 2.5X15. A STENT SYNERGY DES [id number] drug eluting stent was successfully placed. Stent strut is well apposed. Post-stent angioplasty was performed using a BALLOON [place] EMERGE MR 3.0X8. There is no pre-interventional antegrade distal flow (TIMI 0). The post-interventional distal flow is normal (TIMI 3). The intervention was successful . No complications occurred at this lesion.  Thrombectomy: Aspiration thrombectomy was performed using a CATH PRONTO V4 5.52F. The intervention was successful. A large thrombus was removed.  There is no residual stenosis post intervention.  Right Posterior Descending Artery  Ost RPDA lesion, 90% stenosed.  Angioplasty: Lesion crossed with  guidewire using a WIRE ASAHI PROWATER 180CM. Pre-stent angioplasty was performed using a BALLOON EMERGE MR [id number]. A STENT SYNERGY DES 2.5X16 drug eluting stent was successfully placed. Stent strut is well apposed. Post-stent angioplasty was performed using a BALLOON [place] EMERGE MR 2.5X8. The pre-interventional distal flow is normal (TIMI 3). The post-interventional distal flow is normal (TIMI 3). The intervention was successful . No complications occurred at this lesion.  There is no residual stenosis post intervention.  Right Posterior Atrioventricular Branch  Post Atrio lesion, 100% stenosed.  Wall Motion              Left Heart   Left Ventricle There is mild to moderate left ventricular systolic dysfunction. The left ventricular ejection fraction is 35-45% by visual estimate. There are LV function abnormalities due to segmental dysfunction.    Aortic Valve There is no aortic valve stenosis.    Coronary Diagrams   Diagnostic Diagram     Post-Intervention Diagram        PHYSICAL EXAM General: Well developed, well nourished, in no acute distress. Head: Normocephalic, atraumatic, sclera non-icteric, oropharynx is clear Neck: Negative for carotid bruits. JVD not elevated. No adenopathy Lungs: Clear bilaterally to auscultation without wheezes, rales, or rhonchi. Breathing is unlabored. Heart: RRR S1 S2 without murmurs, rubs, or gallops.  Abdomen: Soft, non-tender, non-distended with normoactive bowel sounds. No hepatomegaly. No rebound/guarding. No obvious abdominal masses. Msk:  Strength and tone appears normal for age. Extremities: No clubbing, cyanosis or edema.  Distal pedal pulses are 2+ and equal bilaterally. Right radial site without hematoma. Neuro: Alert and oriented X 3. Moves all extremities spontaneously. Psych:  Responds to questions appropriately with a normal affect.  ASSESSMENT AND PLAN: 1. Inferior STEMI. Ecg shows resolution of ST elevation. CK 1240 with MB  124.8. Troponin not ordered. Cardiac cath results reviewed above. S/p bifurcation stenting of the distal RCA/PDA/PLOM. Moderate to severe disease in first and second diagonal and mid-distal LAD at second diagonal bifurcation. Plan DAPT indefinitely. ASA/Brilinta/ beta blocker and high dose statin. Monitor for recurrent angina. Plan to allow patient to recover from infarct. Treat left sided disease medically for now.  2. Syncope secondary to #1 with hypotension. Resolved. 3. Hypercholesterolemia. On high dose statin 4. Tobacco abuse. Counseled on smoking cessation. He is motivated to quit.  5.  Ischemic cardiomyopathy - check Echo today. Medical therapy limited now by low BP.   Present on Admission: . Acute inferolateral myocardial infarction [facility])   Signed, Tris Howell [place], [place] 10/30/2015 8:02 AM

## 2015-10-31 ENCOUNTER — Ambulatory Visit (HOSPITAL_COMMUNITY): Payer: Commercial Managed Care - HMO

## 2015-10-31 ENCOUNTER — Inpatient Hospital Stay (HOSPITAL_COMMUNITY): Payer: Commercial Managed Care - HMO

## 2015-10-31 ENCOUNTER — Other Ambulatory Visit (HOSPITAL_COMMUNITY): Payer: Self-pay

## 2015-10-31 ENCOUNTER — Telehealth: Payer: Self-pay | Admitting: Adult Health

## 2015-10-31 DIAGNOSIS — I213 ST elevation (STEMI) myocardial infarction of unspecified site: Secondary | ICD-10-CM

## 2015-10-31 LAB — HEMOGLOBIN A1C
Hgb A1c MFr Bld: 5.5 % (ref 4.8–5.6)
Mean Plasma Glucose: 111 mg/dL

## 2015-10-31 LAB — ECHOCARDIOGRAM COMPLETE
Height: 71 in
Height: 71 in
Weight: 2945.35 [oz_av]
Weight: 2945.35 [oz_av]

## 2015-10-31 MED ORDER — METOPROLOL TARTRATE 25 MG PO TABS: 12.5000 mg | ORAL_TABLET | Freq: Two times a day (BID) | ORAL | 6 refills | Status: DC

## 2015-10-31 MED ORDER — ATORVASTATIN CALCIUM 40 MG PO TABS: 40.0000 mg | ORAL_TABLET | Freq: Every day | ORAL | 6 refills | Status: DC

## 2015-10-31 MED ORDER — TICAGRELOR 90 MG PO TABS: 90.0000 mg | ORAL_TABLET | Freq: Two times a day (BID) | ORAL | 10 refills | Status: DC

## 2015-10-31 MED ORDER — TICAGRELOR 90 MG PO TABS: 90.0000 mg | ORAL_TABLET | Freq: Two times a day (BID) | ORAL | 0 refills | Status: DC

## 2015-10-31 MED ORDER — NITROGLYCERIN 0.4 MG SL SUBL: 0.4000 mg | SUBLINGUAL_TABLET | SUBLINGUAL | 3 refills | Status: AC | PRN

## 2015-10-31 NOTE — Progress Notes (Signed)
  Echocardiogram 2D Echocardiogram has been performed.  Jennette Dubin 10/31/2015, 3:18 PM

## 2015-10-31 NOTE — Telephone Encounter (Signed)
TCM 7-10 days / pt to be d/c's today / tg

## 2015-10-31 NOTE — Progress Notes (Signed)
Pt has been discharged home with family. IV and telemetry box were removed. Pt and pt's son received discharge instructions and all questions were answered. Pt was educated on the importance of taking his medications as prescribed. Pt verbalized understanding. Pt left with all of the pt's belongings. Pt left the unit via wheelchair and was accompanied by this RN and pt's family. Pt was in no distress at time of discharge.  Grant Fontana BSN, RN

## 2015-10-31 NOTE — Progress Notes (Addendum)
TELEMETRY: Reviewed telemetry pt in NSR Vitals:   10/30/15 1500 10/30/15 1602 10/30/15 2030 10/31/15 0533  BP: (!) 88/53 (!) 103/55 (!) 99/58 (!) 110/52  Pulse:  60 (!) 58 64  Resp: 15  18 18   Temp:  98.7 F (37.1 C) 98.7 F (37.1 C) 99.1 F (37.3 C)  TempSrc:  Oral Oral Oral  SpO2:  98% 98% 98%  Weight:      Height:        Intake/Output Summary (Last 24 hours) at 10/31/15 0828 Last data filed at 10/30/15 1900  Gross per 24 hour  Intake              640 ml  Output              475 ml  Net              165 ml   Filed Weights   10/29/15 2000 10/29/15 2011  Weight: 173 lb 1 oz (78.5 kg) 184 lb 1.4 oz (83.5 kg)    Subjective  Feels great this am. No chest pain, SOB, or dizziness. Ambulating in halls.  Marland Kitchen aspirin  81 mg Oral Daily  . atorvastatin  40 mg Oral q1800  . fluticasone  2 spray Each Nare Daily  . gabapentin  600 mg Oral BID  . metoprolol tartrate  12.5 mg Oral BID  . mirtazapine  15 mg Oral QHS  . multivitamin with minerals  1 tablet Oral Daily  . sodium chloride flush  3 mL Intravenous Q12H  . ticagrelor  90 mg Oral BID  . tiZANidine  4 mg Oral TID      LABS: Basic Metabolic Panel:  Recent Labs  10/29/15 1828 10/30/15 0437  NA 141 141  K 3.2* 3.7  CL 105 106  CO2  --  23  GLUCOSE 108* 94  BUN 4* <5*  CREATININE 1.00 1.00  CALCIUM  --  9.2   Liver Function Tests: No results for input(s): AST, ALT, ALKPHOS, BILITOT, PROT, ALBUMIN in the last 72 hours. No results for input(s): LIPASE, AMYLASE in the last 72 hours. CBC:  Recent Labs  10/29/15 1828 10/30/15 0437  WBC  --  11.7*  HGB 12.2* 12.1*  HCT 36.0* 37.6*  MCV  --  80.7  PLT  --  240   Cardiac Enzymes:  Recent Labs  10/29/15 2123 10/30/15 0437 10/30/15 1033  CKTOTAL 1,101* 1,240* 1,001*  CKMB 116.9* 124.8* 84.2*   BNP: No results for input(s): PROBNP in the last 72 hours. D-Dimer: No results for input(s): DDIMER in the last 72 hours. Hemoglobin A1C:  Recent Labs  10/30/15 0437  HGBA1C 5.5   Fasting Lipid Panel:  Recent Labs  10/30/15 0437  CHOL 138  HDL 31*  LDLCALC 70  TRIG 183*  CHOLHDL 4.5   Thyroid Function Tests: No results for input(s): TSH, T4TOTAL, T3FREE, THYROIDAB in the last 72 hours.  Invalid input(s): FREET3   Radiology/Studies:  Dg Chest 2 View  Result Date: 10/30/2015 CLINICAL DATA:  MI. EXAM: CHEST  2 VIEW COMPARISON:  12/02/2013. FINDINGS: Mediastinum and hilar structures normal. Lungs are clear. No pleural effusion or pneumothorax. Heart size normal. Carotid vascular calcification. IMPRESSION: 1.  No acute cardiopulmonary disease. 2. Carotid vascular disease. Electronically Signed   By: Marcello Moores  Register   On: 10/30/2015 10:27     Ecg today shows NSR with inferior infarct with Q waves. ST elevation resolved.  Procedures   Coronary Stent Intervention  Left Heart Cath and Coronary Angiography  Thrombectomy  Conclusion     Mid LAD lesion, 75 %stenosed. This is a small vessel that does not reach the apex. Scattered disease in the left system.  The left ventricular ejection fraction is 35-45% by visual estimate.  There is mild to moderate left ventricular systolic dysfunction.  There is no aortic valve stenosis.  Ost RPDA lesion, 90 %stenosed. A STENT SYNERGY DES 2.5X16 drug eluting stent was successfully placed.  Post intervention, there is a 0% residual stenosis.  Distal RCA/posterolateral artery, 100 %stenosed. A STENT SYNERGY DES [id number] drug eluting stent was successfully placed.  Post intervention, there is a 0% residual stenosis.  Stents were placed in Cullotte fashion. Final kissing balloon inflation was performed.   Continue aggressive medical therapy including smoking cessation. He'll be watched in the ICU. Will allow him to recover from the semi-before addressing the left-sided disease.  He'll need dual antiplatelet therapy for a year. Beyond this, he'll likely need lifelong clopidogrel  therapy.   Indications   Acute MI, inferolateral wall, initial episode of care ([place]) [I21.19 (ICD-10-CM)]  Procedural Details/Technique   Technical Details The risks, benefits, and details of the procedure were explained to the patient. The patient verbalized understanding and wanted to proceed. Informed written consent was obtained.  PROCEDURE TECHNIQUE: After Xylocaine anesthesia a 55F slender sheath was placed in the right radial artery with a single anterior needle wall stick. IV Heparin was given. Right coronary angiography was done using a Judkins R4 guide catheter. Left coronary angiography was done using a Judkins L3.5 guide catheter. Left ventriculography was done using a JR4 catheter. A TR band was used for hemostasis.  IV heparin and IV tirofiban were used for the intervention. A pro-water wire was placed across the complete occlusion in the distal RCA going into the posterior lateral artery. Thrombectomy was performed with a Pronto catheter. Large volume of thrombus was removed. This restored TIMI 3 flow. Disease was present at the ostial PDA as well. This was a [place] 1, 1, 1 bifurcation. Kissing balloon inflation was then performed with a 2.5 and a 2.25 balloons. The 2.5 balloon was in the posterior lateral artery and the 2.25 balloon was in the PDA. There was significant improvement in flow. The ostial PDA was then stented with a 2.5 x 16 Synergy stent with a stent extending back into the distal RCA. The pro-water wire in the posterior lateral artery was then removed and then repeat placed through the stent struts. There was difficulty in rewiring so a 3.0 balloon was used to post dilate the proximal portion of the distal RCA/PDA stent. Rewiring was then performed. A 2.5 balloon was used to predilate the stent strut. The 2.75 x 16 Synergy drug-eluting stent was then advanced through the stent struts into the posterior lateral artery. This stent was deployed with stent extending back from  the PLA into the distal RCA. The stent was postdilated with a 3.0 noncompliant balloon proximally. A 2.5 balloon was then advanced into the posterior descending artery. Final kissing balloon inflation was performed with a 3.0 in the distal right/posterior lateral artery, and a 2.5 noncompliant balloon in the PDA/distal RCA stent. There was an excellent angiographic result with no residual stenosis. The patient tolerated the procedure well.  Contrast: 170 cc    Estimated blood loss <50 mL.  During this procedure the patient was administered the following to achieve and maintain moderate conscious sedation: Versed 1 mg, Fentanyl 25 mcg, while the  patient's heart rate, blood pressure, and oxygen saturation were continuously monitored. The period of conscious sedation was 54 minutes, of which I was present face-to-face 100% of this time.    Complications   Complications documented before study signed (10/29/2015 9:02 PM EDT)    No complications were associated with this study.  Documented by Jettie Booze, MD - 10/29/2015 8:54 PM EDT    Coronary Findings   Dominance: Right  Left Anterior Descending  Vessel is small.  Mid LAD lesion, 75% stenosed.  First Diagonal Branch  Ost 1st Diag to 1st Diag lesion, 80% stenosed.  Second Diagonal [employer] 2nd Diag lesion, 70% stenosed.  Left Circumflex  Vessel is small.  Lateral Second Obtuse Marginal Branch  Lat 2nd Mrg lesion, 80% stenosed.  Right Coronary Artery  Vessel is large.  Prox RCA lesion, 25% stenosed.  Dist RCA lesion, 100% stenosed. The lesion is type C. culprit lesion  Angioplasty: Lesion crossed with guidewire using a WIRE ASAHI PROWATER 180CM. Pre-stent angioplasty was performed using a BALLOON EMERGE MR 2.5X15. A STENT SYNERGY DES [id number] drug eluting stent was successfully placed. Stent strut is well apposed. Post-stent angioplasty was performed using a BALLOON [place] EMERGE MR 3.0X8. There is no pre-interventional antegrade  distal flow (TIMI 0). The post-interventional distal flow is normal (TIMI 3). The intervention was successful . No complications occurred at this lesion.  Thrombectomy: Aspiration thrombectomy was performed using a CATH PRONTO V4 5.75F. The intervention was successful. A large thrombus was removed.  There is no residual stenosis post intervention.  Right Posterior Descending Artery  Ost RPDA lesion, 90% stenosed.  Angioplasty: Lesion crossed with guidewire using a WIRE ASAHI PROWATER 180CM. Pre-stent angioplasty was performed using a BALLOON EMERGE MR [id number]. A STENT SYNERGY DES 2.5X16 drug eluting stent was successfully placed. Stent strut is well apposed. Post-stent angioplasty was performed using a BALLOON [place] EMERGE MR 2.5X8. The pre-interventional distal flow is normal (TIMI 3). The post-interventional distal flow is normal (TIMI 3). The intervention was successful . No complications occurred at this lesion.  There is no residual stenosis post intervention.  Right Posterior Atrioventricular Branch  Post Atrio lesion, 100% stenosed.  Wall Motion              Left Heart   Left Ventricle There is mild to moderate left ventricular systolic dysfunction. The left ventricular ejection fraction is 35-45% by visual estimate. There are LV function abnormalities due to segmental dysfunction.    Aortic Valve There is no aortic valve stenosis.    Coronary Diagrams   Diagnostic Diagram     Post-Intervention Diagram        PHYSICAL EXAM General: Well developed, well nourished, in no acute distress. Head: Normal Neck: Negative for carotid bruits. JVD not elevated. No adenopathy Lungs: Clear bilaterally to auscultation without wheezes, rales, or rhonchi. Breathing is unlabored. Heart: RRR S1 S2 without murmurs, rubs, or gallops.  Abdomen: Soft, non-tender, non-distended with normoactive bowel sounds. No hepatomegaly. No rebound/guarding. No obvious abdominal masses. Extremities: No  clubbing, cyanosis or edema.  Distal pedal pulses are 2+ and equal bilaterally. Right radial site without hematoma. Neuro: Alert and oriented X 3. Moves all extremities spontaneously. Psych:  Responds to questions appropriately with a normal affect.  ASSESSMENT AND PLAN: 1. Inferior STEMI. Ecg shows resolution of ST elevation. CK 1240 with MB 124.8. Troponin not ordered. Cardiac cath results reviewed above. S/p bifurcation stenting of the distal RCA/PDA/PLOM. Moderate to severe disease in first and  second diagonal and mid-distal LAD at second diagonal bifurcation. Plan DAPT indefinitely. ASA/Brilinta/ beta blocker and high dose statin. Monitor for recurrent angina. Plan to treat LAD/diagonal disease medically unless he has recurrent angina.   2. Syncope secondary to #1 with hypotension. Resolved. 3. Hypercholesterolemia. On high dose statin 4. Tobacco abuse. Counseled on smoking cessation. He is motivated to quit.  5. Ischemic cardiomyopathy - check Echo today. Medical therapy limited now by low BP. Currently on low dose beta blocker. Can consider ACEi as outpatient if BP improves.  6. Carotid calcification noted on CXR. Consider dopplers as outpatient.  Patient is stable for DC today. Follow up in our [place] office. Echo pending   Present on Admission: . Acute MI, inferolateral wall, initial episode of care ([place]) . Acute inferolateral myocardial infarction ([place]) . Hypercholesterolemia . Tobacco abuse . Syncope   Signed, Priscillia Fouch Martinique, [place] 10/31/2015 8:28 AM

## 2015-10-31 NOTE — Progress Notes (Signed)
CARDIAC REHAB PHASE I   PRE:  Rate/Rhythm: 71 SR  BP:  Sitting: 107/63        SaO2: 97 RA  MODE:  Ambulation: 700 ft   POST:  Rate/Rhythm: 77 SR  BP:  Sitting: 108/67         SaO2: 99 RA  Pt ambulated 700 ft on RA, independent, steady gait, tolerated well with no complaints. Reviewed education with teach back, pt verbalized understanding. Pt to bed per pt request after walk, call bell within reach.   Sayre, RN, BSN 10/31/2015 10:10 AM

## 2015-10-31 NOTE — Research (Signed)
[place] Informed Consent   Subject Name: Corey [name]  Subject met inclusion and exclusion criteria.  The informed consent form, study requirements and expectations were reviewed with the subject and questions and concerns were addressed prior to the signing of the consent form.  The subject verbalized understanding of the trail requirements.  The subject agreed to participate in the [facility] trial and signed the informed consent.  The informed consent was obtained prior to performance of any protocol-specific procedures for the subject.  A copy of the signed informed consent was given to the subject and a copy was placed in the subject's medical record.  Hedrick,Tammy W 10/31/2015, 0945

## 2015-10-31 NOTE — Discharge Summary (Signed)
Discharge Summary    Patient ID: Corey [name],  MRN: [id number], DOB/AGE: 1950-07-26 65 y.o.  Admit date: 10/29/2015 Discharge date: 10/31/2015  Primary Care Provider: Glo Herring Primary Cardiologist: New  Discharge Diagnoses    Principal Problem:   Acute MI, inferolateral wall, initial episode of care [facility]) Active Problems:   Hypercholesterolemia   Acute inferolateral myocardial infarction ([place])   Tobacco abuse   Syncope   Allergies No Known Allergies  Diagnostic Studies/Procedures    LHC: 10/4  Conclusion     Mid LAD lesion, 75 %stenosed. This is a small vessel that does not reach the apex. Scattered disease in the left system.  The left ventricular ejection fraction is 35-45% by visual estimate.  There is mild to moderate left ventricular systolic dysfunction.  There is no aortic valve stenosis.  Ost RPDA lesion, 90 %stenosed. A STENT SYNERGY DES 2.5X16 drug eluting stent was successfully placed.  Post intervention, there is a 0% residual stenosis.  Distal RCA/posterolateral artery, 100 %stenosed. A STENT SYNERGY DES [id number] drug eluting stent was successfully placed.  Post intervention, there is a 0% residual stenosis.  Stents were placed in Cullotte fashion. Final kissing balloon inflation was performed.   Continue aggressive medical therapy including smoking cessation. He'll be watched in the ICU. Will allow him to recover from the semi-before addressing the left-sided disease.  He'll need dual antiplatelet therapy for a year. Beyond this, he'll likely need lifelong clopidogrel therapy.   Coronary Diagrams   Diagnostic Diagram     Post-Intervention Diagram      2D echo: 10/6  Study Conclusions  - Left ventricle: Mild inferior to inferior lateral hypokinesis.   The cavity size was normal. Wall thickness was increased in a   pattern of moderate LVH. Systolic function was normal. The   estimated ejection fraction was in the range  of 50% to 55%.   Doppler parameters are consistent with abnormal left ventricular   relaxation (grade 1 diastolic dysfunction). - Atrial septum: No defect or patent foramen ovale was identified. _____________   History of Present Illness     65 year old man with a history of tobacco abuse and high cholesterol. He had been feeling well, and his usual state of health. He was driving to [facility] in [place] and felt some pressure in his chest. He then blacked out. EMS was called and initial ECG revealed bradycardia with inferior ST elevation. He was transported to [facility] for emergent cardiac catheterization. Upon arrival, his pain was 5 out of 10. His blood pressure was stable in the [id number] systolic range. When EMS first saw him, he was quite hypotensive.  Hospital Course     Consultants: None  He underwent LHC with Dr. Irish Lack on 10/4 showing severe disease at the bifurcation of his posterior lateral artery and posterior descending artery and the distal RCA. He was treated with bifurcation stents and a culotte fashion. Moderate disease was also noted to the mid LAD with scattered disease in the left system. Plan to continue with DAPT for at least one year (ASA and Brilinta), may need life long therapy. Follow up EKG showed resolution of ST elevation. CK 1240 and MB 124.8. He was continued on BB with stating changed to dose. Left system disease will be treated with medical therapy at this time.   Follow up 2D echo showed low normal EF of 50-55% with G1DD. His blood pressure remained low during admission which limited the addition of ACEi or further  titration of BB. CXR did note carotid calcification, consider dopplers in the outpatient setting. He had no further anginal episodes after intervention and worked well with cardiac rehab. He was also counseled regarding smoking cessation. Hgb A1c was 5.5.  On 10/6 he was seen and assessed by Dr. Martinique and determined stable for discharge home.  Follow up was arranged in the [place] office as he lives in this area.  _____________  Discharge Vitals Blood pressure 108/67, pulse 72, temperature 99.1 F (37.3 C), temperature source Oral, resp. rate 18, height 5\' 11"  (1.803 m), weight 184 lb 1.4 oz (83.5 kg), SpO2 98 %.  Filed Weights   10/29/15 2000 10/29/15 2011  Weight: 173 lb 1 oz (78.5 kg) 184 lb 1.4 oz (83.5 kg)    Labs & Radiologic Studies    CBC  Recent Labs  10/29/15 1828 10/30/15 0437  WBC  --  11.7*  HGB 12.2* 12.1*  HCT 36.0* 37.6*  MCV  --  80.7  PLT  --  [id number]   Basic Metabolic Panel  Recent Labs  10/29/15 1828 10/30/15 0437  NA 141 141  K 3.2* 3.7  CL 105 106  CO2  --  23  GLUCOSE 108* 94  BUN 4* <5*  CREATININE 1.00 1.00  CALCIUM  --  9.2   Liver Function Tests No results for input(s): AST, ALT, ALKPHOS, BILITOT, PROT, ALBUMIN in the last 72 hours. No results for input(s): LIPASE, AMYLASE in the last 72 hours. Cardiac Enzymes  Recent Labs  10/29/15 2123 10/30/15 0437 10/30/15 1033  CKTOTAL 1,101* 1,240* 1,001*  CKMB 116.9* 124.8* 84.2*   BNP Invalid input(s): POCBNP D-Dimer No results for input(s): DDIMER in the last 72 hours. Hemoglobin A1C  Recent Labs  10/30/15 0437  HGBA1C 5.5   Fasting Lipid Panel  Recent Labs  10/30/15 0437  CHOL 138  HDL 31*  LDLCALC 70  TRIG 183*  CHOLHDL 4.5   Thyroid Function Tests No results for input(s): TSH, T4TOTAL, T3FREE, THYROIDAB in the last 72 hours.  Invalid input(s): FREET3 _____________  Dg Chest 2 View  Result Date: 10/30/2015 CLINICAL DATA:  MI. EXAM: CHEST  2 VIEW COMPARISON:  12/02/2013. FINDINGS: Mediastinum and hilar structures normal. Lungs are clear. No pleural effusion or pneumothorax. Heart size normal. Carotid vascular calcification. IMPRESSION: 1.  No acute cardiopulmonary disease. 2. Carotid vascular disease. Electronically Signed   By: Marcello Moores  Register   On: 10/30/2015 10:27   Disposition   Pt is being  discharged home today in good condition.  Follow-up Plans & Appointments    Follow-up Information    Jory Sims, NP Follow up on 11/07/2015.   Specialties:  Nurse Practitioner, Radiology, Cardiology Why:  at 2:15pm for your hospital follow up Contact information: [address] [place] [id number] [phone]          Discharge Instructions    Amb Referral to Cardiac Rehabilitation    Complete by:  As directed    Diagnosis:   STEMI Coronary Stents     Diet - low sodium heart healthy    Complete by:  As directed    Discharge instructions    Complete by:  As directed    Radial Site Care Refer to this sheet in the next few weeks. These instructions provide you with information on caring for yourself after your procedure. Your caregiver may also give you more specific instructions. Your treatment has been planned according to current medical practices, but problems sometimes occur. Call  your caregiver if you have any problems or questions after your procedure. HOME CARE INSTRUCTIONS You may shower the day after the procedure.Remove the bandage (dressing) and gently wash the site with plain soap and water.Gently pat the site dry.  Do not apply powder or lotion to the site.  Do not submerge the affected site in water for 3 to 5 days.  Inspect the site at least twice daily.  Do not flex or bend the affected arm for 24 hours.  No lifting over 5 pounds (2.3 kg) for 5 days after your procedure.  Do not drive home if you are discharged the same day of the procedure. Have someone else drive you.  You may drive 24 hours after the procedure unless otherwise instructed by your caregiver.  What to expect: Any bruising will usually fade within 1 to 2 weeks.  Blood that collects in the tissue (hematoma) may be painful to the touch. It should usually decrease in size and tenderness within 1 to 2 weeks.  SEEK IMMEDIATE MEDICAL CARE IF: You have unusual pain at the radial site.  You  have redness, warmth, swelling, or pain at the radial site.  You have drainage (other than a small amount of blood on the dressing).  You have chills.  You have a fever or persistent symptoms for more than 72 hours.  You have a fever and your symptoms suddenly get worse.  Your arm becomes pale, cool, tingly, or numb.  You have heavy bleeding from the site. Hold pressure on the site.   Increase activity slowly    Complete by:  As directed       Discharge Medications   Current Discharge Medication List    START taking these medications   Details  atorvastatin (LIPITOR) 40 MG tablet Take 1 tablet (40 mg total) by mouth daily at 6 PM.     metoprolol tartrate (LOPRESSOR) 25 MG tablet Take 0.5 tablets (12.5 mg total) by mouth 2 (two) times daily.     nitroGLYCERIN (NITROSTAT) 0.4 MG SL tablet Place 1 tablet (0.4 mg total) under the tongue every 5 (five) minutes as needed for chest pain.     ticagrelor (BRILINTA) 90 MG TABS tablet Take 1 tablet (90 mg total) by mouth 2 (two) times daily.       CONTINUE these medications which have NOT CHANGED   Details  ALPRAZolam (XANAX) 1 MG tablet Take 1 tablet by mouth 3 (three) times daily as needed for anxiety.      aspirin EC 81 MG tablet Take 1 tablet (81 mg total) by mouth daily.    fluticasone (FLONASE) 50 MCG/ACT nasal spray Place 2 sprays into both nostrils daily.    gabapentin (NEURONTIN) 300 MG capsule Take 600 mg by mouth 2 (two) times daily.     HYDROcodone-acetaminophen (NORCO/VICODIN) 5-325 MG tablet Take 1 tablet by mouth every 4 (four) hours as needed for moderate pain.      mirtazapine (REMERON) 15 MG tablet Take 1 tablet (15 mg total) by mouth at bedtime.     Multiple Vitamin (MULTIVITAMIN WITH MINERALS) TABS tablet Take 1 tablet by mouth daily. Centrum Silver    pantoprazole (PROTONIX) 40 MG tablet TAKE 1 TABLET BY MOUTH EVERY DAY 30 MINUTES BEFORE BREAKFAST     tiZANidine (ZANAFLEX) 4 MG tablet Take 4 mg by mouth  3 (three) times daily as needed for muscle spasms.     zolpidem (AMBIEN) 10 MG tablet Take 1 tablet by mouth at bedtime  as needed for sleep.       STOP taking these medications     simvastatin (ZOCOR) 80 MG tablet      meloxicam (MOBIC) 7.5 MG tablet          Aspirin prescribed at discharge?  Yes High Intensity Statin Prescribed? (Lipitor 40-80mg  or Crestor 20-40mg ): Yes Beta Blocker Prescribed? Yes For EF <40%, was ACEI/ARB Prescribed? No:  ADP Receptor Inhibitor Prescribed? (i.e. Plavix etc.-Includes Medically Managed Patients): Yes For EF <40%, Aldosterone Inhibitor Prescribed? No: Soft BP Was EF assessed during THIS hospitalization? Yes Was Cardiac Rehab II ordered? (Included Medically managed Patients): Yes   Outstanding Labs/Studies   LFTs and FLP in 6 to 8 weeks  Duration of Discharge Encounter   Greater than 30 minutes including physician time.  Signed, Reino Bellis NP-C 10/31/2015, 10:20 PM

## 2015-11-03 DIAGNOSIS — Z1389 Encounter for screening for other disorder: Secondary | ICD-10-CM | POA: Diagnosis not present

## 2015-11-03 DIAGNOSIS — I2510 Atherosclerotic heart disease of native coronary artery without angina pectoris: Secondary | ICD-10-CM | POA: Diagnosis not present

## 2015-11-03 DIAGNOSIS — Z9861 Coronary angioplasty status: Secondary | ICD-10-CM | POA: Diagnosis not present

## 2015-11-03 DIAGNOSIS — Z6825 Body mass index (BMI) 25.0-25.9, adult: Secondary | ICD-10-CM | POA: Diagnosis not present

## 2015-11-03 DIAGNOSIS — F419 Anxiety disorder, unspecified: Secondary | ICD-10-CM | POA: Diagnosis not present

## 2015-11-07 ENCOUNTER — Ambulatory Visit (INDEPENDENT_AMBULATORY_CARE_PROVIDER_SITE_OTHER): Payer: Commercial Managed Care - HMO | Admitting: Adult Health

## 2015-11-07 ENCOUNTER — Telehealth: Payer: Self-pay | Admitting: *Deleted

## 2015-11-07 ENCOUNTER — Encounter: Payer: Self-pay | Admitting: Adult Health

## 2015-11-07 VITALS — BP 104/58 | HR 55 | Ht 71.0 in | Wt 175.0 lb

## 2015-11-07 DIAGNOSIS — E782 Mixed hyperlipidemia: Secondary | ICD-10-CM | POA: Diagnosis not present

## 2015-11-07 DIAGNOSIS — Z23 Encounter for immunization: Secondary | ICD-10-CM

## 2015-11-07 DIAGNOSIS — Z720 Tobacco use: Secondary | ICD-10-CM | POA: Diagnosis not present

## 2015-11-07 DIAGNOSIS — I252 Old myocardial infarction: Secondary | ICD-10-CM

## 2015-11-07 NOTE — Patient Instructions (Addendum)
Your physician recommends that you schedule a follow-up appointment in: 3 months with MD     Your physician recommends that you continue on your current medications as directed. Please refer to the Current Medication list given to you today.     Get lab work JUST BEFORE NEXT VISIT    You have been referred to  [facility]      Thank you for choosing [facility] !

## 2015-11-07 NOTE — Telephone Encounter (Signed)
I spoke with patient and let him know he did not have the genotype for the [facility] Gene Study. I thanked patient for his participation in the study.Patient is now a screen failure for the study.
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
## 2015-11-07 NOTE — Progress Notes (Signed)
Name: Corey [name]    DOB: 18-Nov-1950  Age: 65 y.o.  MR#: [id number]       PCP:  [facility]: Payor: Mcarthur Rossetti MEDICARE / Plan: [place] THN/NTSP / Product Type: *No Product type* /   CC:   No chief complaint on file.   VS Vitals:   11/07/15 1442  Weight: 175 lb (79.4 kg)  Height: 5\' 11"  (1.803 m)    Weights Current Weight  11/07/15 175 lb (79.4 kg)  10/29/15 184 lb 1.4 oz (83.5 kg)  07/01/15 184 lb (83.5 kg)    Blood Pressure  BP Readings from Last 3 Encounters:  10/31/15 108/67  07/01/15 114/66  06/26/15 124/65     Admit date:  (Not on file) Last encounter with RMR:  10/31/2015   Allergy Review of patient's allergies indicates no known allergies.  Current Outpatient Prescriptions  Medication Sig Dispense Refill  . ALPRAZolam (XANAX) 1 MG tablet Take 1 tablet by mouth 3 (three) times daily as needed for anxiety.   1  . aspirin EC 81 MG tablet Take 1 tablet (81 mg total) by mouth daily.    Marland Kitchen atorvastatin (LIPITOR) 40 MG tablet Take 1 tablet (40 mg total) by mouth daily at 6 PM. 30 tablet 6  . fluticasone (FLONASE) 50 MCG/ACT nasal spray Place 2 sprays into both nostrils daily.    Marland Kitchen gabapentin (NEURONTIN) 300 MG capsule Take 600 mg by mouth 2 (two) times daily.     Marland Kitchen HYDROcodone-acetaminophen (NORCO/VICODIN) 5-325 MG tablet Take 1 tablet by mouth every 4 (four) hours as needed for moderate pain.   0  . metoprolol tartrate (LOPRESSOR) 25 MG tablet Take 0.5 tablets (12.5 mg total) by mouth 2 (two) times daily. 60 tablet 6  . mirtazapine (REMERON) 15 MG tablet Take 1 tablet (15 mg total) by mouth at bedtime. 30 tablet 0  . Multiple Vitamin (MULTIVITAMIN WITH MINERALS) TABS tablet Take 1 tablet by mouth daily. Centrum Silver    . nitroGLYCERIN (NITROSTAT) 0.4 MG SL tablet Place 1 tablet (0.4 mg total) under the tongue every 5 (five) minutes as needed for chest pain. 25 tablet 3  . pantoprazole (PROTONIX) 40 MG tablet TAKE 1 TABLET BY  MOUTH EVERY DAY 30 MINUTES BEFORE BREAKFAST 90 tablet 3  . ticagrelor (BRILINTA) 90 MG TABS tablet Take 1 tablet (90 mg total) by mouth 2 (two) times daily. 60 tablet 0  . tiZANidine (ZANAFLEX) 4 MG tablet Take 4 mg by mouth 3 (three) times daily as needed for muscle spasms.     Marland Kitchen zolpidem (AMBIEN) 10 MG tablet Take 1 tablet by mouth at bedtime as needed for sleep.      No current facility-administered medications for this visit.     Discontinued Meds:   There are no discontinued medications.  Patient Active Problem List   Diagnosis Date Noted  . Tobacco abuse 10/30/2015  . Syncope 10/30/2015  . Acute inferolateral myocardial infarction ([place]) 10/29/2015  . Acute MI, inferolateral wall, initial episode of care ([place])   . Bipolar affective disorder, current episode manic with psychotic symptoms ([place]) 04/06/2013  . 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 Barrett's esophagus 02/28/2013  . Sepsis ([place]) 01/25/2013  . CKD (chronic kidney disease), stage III 01/25/2013  . CAP (community acquired pneumonia) 01/24/2013  . AKI (acute kidney injury) ([place]) 01/24/2013  . Acute encephalopathy 01/24/2013  . Community acquired bacterial pneumonia 01/24/2013  . SIRS (  systemic inflammatory response syndrome) ([place]) 01/24/2013  . Iron deficiency anemia 12/09/2011  . Hypercholesterolemia 12/09/2011    LABS    Component Value Date/Time   NA 141 10/30/2015 0437   NA 141 10/29/2015 1828   NA 140 06/26/2015 1015   K 3.7 10/30/2015 0437   K 3.2 (L) 10/29/2015 1828   K 3.9 06/26/2015 1015   CL 106 10/30/2015 0437   CL 105 10/29/2015 1828   CL 103 06/26/2015 1015   CO2 23 10/30/2015 0437   CO2 27 06/26/2015 1015   CO2 28 12/11/2013 0910   GLUCOSE 94 10/30/2015 0437   GLUCOSE 108 (H) 10/29/2015 1828   GLUCOSE 80 06/26/2015 1015   BUN <5 (L) 10/30/2015 0437   BUN 4 (L) 10/29/2015 1828   BUN 7 06/26/2015 1015   CREATININE 1.00 10/30/2015 0437   CREATININE 1.00  10/29/2015 1828   CREATININE 1.00 06/26/2015 1015   CALCIUM 9.2 10/30/2015 0437   CALCIUM 8.6 (L) 06/26/2015 1015   CALCIUM 9.0 12/11/2013 0910   GFRNONAA >60 10/30/2015 0437   GFRNONAA >60 06/26/2015 1015   GFRNONAA 56 (L) 12/11/2013 0910   GFRAA >60 10/30/2015 0437   GFRAA >60 06/26/2015 1015   GFRAA 65 (L) 12/11/2013 0910   CMP     Component Value Date/Time   NA 141 10/30/2015 0437   K 3.7 10/30/2015 0437   CL 106 10/30/2015 0437   CO2 23 10/30/2015 0437   GLUCOSE 94 10/30/2015 0437   BUN <5 (L) 10/30/2015 0437   CREATININE 1.00 10/30/2015 0437   CALCIUM 9.2 10/30/2015 0437   PROT 6.9 12/02/2013 2230   ALBUMIN 3.3 (L) 12/02/2013 2230   AST 38 (H) 12/02/2013 2230   ALT 27 12/02/2013 2230   ALKPHOS 60 12/02/2013 2230   BILITOT 0.2 (L) 12/02/2013 2230   GFRNONAA >60 10/30/2015 0437   GFRAA >60 10/30/2015 0437       Component Value Date/Time   WBC 11.7 (H) 10/30/2015 0437   WBC 7.0 06/26/2015 1015   WBC 11.3 (H) 12/11/2013 0910   HGB 12.1 (L) 10/30/2015 0437   HGB 12.2 (L) 10/29/2015 1828   HGB 11.9 (L) 06/26/2015 1015   HCT 37.6 (L) 10/30/2015 0437   HCT 36.0 (L) 10/29/2015 1828   HCT 37.1 (L) 06/26/2015 1015   MCV 80.7 10/30/2015 0437   MCV 84.5 06/26/2015 1015   MCV 75.3 (L) 12/11/2013 0910    Lipid Panel     Component Value Date/Time   CHOL 138 10/30/2015 0437   TRIG 183 (H) 10/30/2015 0437   HDL 31 (L) 10/30/2015 0437   CHOLHDL 4.5 10/30/2015 0437   VLDL 37 10/30/2015 0437   LDLCALC 70 10/30/2015 0437    ABG    Component Value Date/Time   PHART 7.415 01/24/2013 1205   PCO2ART 29.9 (L) 01/24/2013 1205   PO2ART 65.4 (L) 01/24/2013 1205   HCO3 18.8 (L) 01/24/2013 1205   TCO2 21 10/29/2015 1828   ACIDBASEDEF 5.0 (H) 01/24/2013 1205   O2SAT 93.1 01/24/2013 1205     Lab Results  Component Value Date   TSH 3.142 04/06/2013   BNP (last 3 results) No results for input(s): BNP in the last 8760 hours.  ProBNP (last 3 results) No results for  input(s): PROBNP in the last 8760 hours.  Cardiac Panel (last 3 results) No results for input(s): CKTOTAL, CKMB, TROPONINI, RELINDX in the last 72 hours.  Iron/TIBC/Ferritin/ %Sat No results found for: IRON, TIBC, FERRITIN, IRONPCTSAT   EKG  Orders placed or performed during the hospital encounter of 10/29/15  . EKG 12-Lead  . EKG 12-Lead  . EKG 12-Lead immediately post procedure  . EKG 12-Lead  . EKG 12-Lead immediately post procedure  . EKG 12-Lead  . EKG 12-Lead  . 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 EXAM: CHEST  2 VIEW COMPARISON:  12/02/2013. FINDINGS: Mediastinum and hilar structures normal. Lungs are clear. No pleural effusion or pneumothorax. Heart size normal. Carotid vascular calcification. IMPRESSION: 1.  No acute cardiopulmonary disease. 2. Carotid vascular disease. Electronically Signed   By: Marcello Moores  Register   On: 10/30/2015 10:27

## 2015-12-24 DIAGNOSIS — L570 Actinic keratosis: Secondary | ICD-10-CM | POA: Diagnosis not present

## 2015-12-24 DIAGNOSIS — Z1389 Encounter for screening for other disorder: Secondary | ICD-10-CM | POA: Diagnosis not present

## 2015-12-24 DIAGNOSIS — Z6825 Body mass index (BMI) 25.0-25.9, adult: Secondary | ICD-10-CM | POA: Diagnosis not present

## 2015-12-24 DIAGNOSIS — G4709 Other insomnia: Secondary | ICD-10-CM | POA: Diagnosis not present

## 2015-12-30 DIAGNOSIS — Z961 Presence of intraocular lens: Secondary | ICD-10-CM | POA: Diagnosis not present

## 2016-01-07 ENCOUNTER — Emergency Department (HOSPITAL_COMMUNITY)
Admission: EM | Admit: 2016-01-07 | Discharge: 2016-01-07 | Disposition: A | Discharge status: Home / Self Care | Payer: Commercial Managed Care - HMO | Attending: Emergency Medicine | Admitting: Emergency Medicine

## 2016-01-07 ENCOUNTER — Encounter (HOSPITAL_COMMUNITY): Payer: Self-pay | Admitting: Emergency Medicine

## 2016-01-07 DIAGNOSIS — Z79899 Other long term (current) drug therapy: Secondary | ICD-10-CM | POA: Diagnosis not present

## 2016-01-07 DIAGNOSIS — W06XXXA Fall from bed, initial encounter: Secondary | ICD-10-CM | POA: Diagnosis not present

## 2016-01-07 DIAGNOSIS — Z7982 Long term (current) use of aspirin: Secondary | ICD-10-CM | POA: Insufficient documentation

## 2016-01-07 DIAGNOSIS — Y999 Unspecified external cause status: Secondary | ICD-10-CM | POA: Diagnosis not present

## 2016-01-07 DIAGNOSIS — Y929 Unspecified place or not applicable: Secondary | ICD-10-CM | POA: Diagnosis not present

## 2016-01-07 DIAGNOSIS — S0990XA Unspecified injury of head, initial encounter: Secondary | ICD-10-CM | POA: Diagnosis present

## 2016-01-07 DIAGNOSIS — N183 Chronic kidney disease, stage 3 (moderate): Secondary | ICD-10-CM | POA: Diagnosis not present

## 2016-01-07 DIAGNOSIS — S0101XA Laceration without foreign body of scalp, initial encounter: Secondary | ICD-10-CM | POA: Diagnosis not present

## 2016-01-07 DIAGNOSIS — Y9389 Activity, other specified: Secondary | ICD-10-CM | POA: Insufficient documentation

## 2016-01-07 DIAGNOSIS — Z87891 Personal history of nicotine dependence: Secondary | ICD-10-CM | POA: Diagnosis not present

## 2016-01-07 NOTE — ED Notes (Signed)
Pt left AMA after getting bandage placed on head.
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
## 2016-01-07 NOTE — Discharge Instructions (Signed)
Clean laceration twice a day. Have sutures removed in one week. Follow-up as needed

## 2016-01-07 NOTE — ED Provider Notes (Signed)
[facility] DEPT Provider Note   CSN: [id number] Arrival date & time: 01/07/16  1542     History   Chief Complaint Chief Complaint  Patient presents with  . Fall    HPI Corey [name] is a 65 y.o. male.  Patient states that he got up out of bed and hit his head. No loss of consciousness   The history is provided by the patient. No language interpreter was used.  Fall  This is a new problem. The current episode started 6 to 12 hours ago. The problem occurs constantly. The problem has not changed since onset.Pertinent negatives include no chest pain, no abdominal pain and no headaches. Nothing aggravates the symptoms. Nothing relieves the symptoms.    Past Medical History:  Diagnosis Date  . Anxiety   . Arthritis   . Bipolar disorder ([place])   . Depression   . High cholesterol   . Iron deficiency anemia 12/09/2011  . Migraine   . Tinnitus     Patient Active Problem List   Diagnosis Date Noted  . Tobacco abuse 10/30/2015  . Syncope 10/30/2015  . Acute inferolateral myocardial infarction ([place]) 10/29/2015  . Acute MI, inferolateral wall, initial episode of care ([place])   . Bipolar affective disorder, current episode manic with psychotic symptoms ([place]) 04/06/2013  . 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 Barrett's esophagus 02/28/2013  . Sepsis ([place]) 01/25/2013  . CKD (chronic kidney disease), stage III 01/25/2013  . CAP (community acquired pneumonia) 01/24/2013  . AKI (acute kidney injury) ([place]) 01/24/2013  . Acute encephalopathy 01/24/2013  . Community acquired bacterial pneumonia 01/24/2013  . SIRS (systemic inflammatory response syndrome) ([place]) 01/24/2013  . Iron deficiency anemia 12/09/2011  . Hypercholesterolemia 12/09/2011    Past Surgical History:  Procedure Laterality Date  . BACK SURGERY    . CARDIAC CATHETERIZATION N/A 10/29/2015   Procedure: Left Heart Cath and Coronary Angiography;  Surgeon: Jettie Booze, MD;   Location: [facility];  Service: Cardiovascular;  Laterality: N/A;  . CARDIAC CATHETERIZATION N/A 10/29/2015   Procedure: Coronary Stent Intervention;  Surgeon: Jettie Booze, MD;  Location: [facility];  Service: Cardiovascular;  Laterality: N/A;  . CATARACT EXTRACTION W/PHACO Left 07/01/2015   Procedure: CATARACT EXTRACTION PHACO AND INTRAOCULAR LENS PLACEMENT (IOC);  Surgeon: Rutherford Guys, MD;  Location: [facility];  Service: Ophthalmology;  Laterality: Left;  CDE: 4.16  . ESOPHAGOGASTRODUODENOSCOPY  01/27/2012   Procedure: ESOPHAGOGASTRODUODENOSCOPY (EGD);  Surgeon: Rogene Houston, MD;  [facility];  Service: Endoscopy;  Laterality: N/A;  350-rescheduled to 9:15 Ann notitied pt  . HAND SURGERY Bilateral   . HERNIA REPAIR    . INSERTION OF MESH N/A 12/14/2013   Procedure: INSERTION OF MESH;  Surgeon: Jamesetta So, MD;  Location: [facility];  Service: General;  Laterality: N/A;  . MASS EXCISION Left 07/14/2012   Procedure: EXCISION SOFT TISSUE MASS LEG;  Surgeon: Jamesetta So, MD;  Location: [facility];  Service: General;  Laterality: Left;  . PERIPHERAL VASCULAR CATHETERIZATION  10/29/2015   Procedure: Thrombectomy;  Surgeon: Jettie Booze, MD;  Location: [facility];  Service: Cardiovascular;;  . ROTATOR CUFF REPAIR Bilateral   . UMBILICAL HERNIA REPAIR N/A 12/14/2013   Procedure: UMBILICAL HERNIORRHAPHY;  Surgeon: Jamesetta So, MD;  Location: [facility];  Service: General;  Laterality: N/A;       Home Medications    Prior to Admission medications   Medication Sig Start  Date End Date Taking? Authorizing Provider  aspirin EC 81 MG tablet Take 1 tablet (81 mg total) by mouth daily. 04/13/13  Yes Niel Hummer, NP  atorvastatin (LIPITOR) 40 MG tablet Take 1 tablet (40 mg total) by mouth daily at 6 PM. 10/31/15  Yes Cheryln Manly, NP  HYDROcodone-acetaminophen (NORCO/VICODIN) 5-325 MG tablet Take 1 tablet by mouth every 4 (four) hours as needed for moderate  pain.  05/20/15  Yes Historical Provider, MD  metoprolol tartrate (LOPRESSOR) 25 MG tablet Take 0.5 tablets (12.5 mg total) by mouth 2 (two) times daily. 10/31/15  Yes Cheryln Manly, NP  ALPRAZolam Duanne Moron) 1 MG tablet Take 1 tablet by mouth 3 (three) times daily as needed for anxiety.  06/04/15   Historical Provider, MD  fluticasone (FLONASE) 50 MCG/ACT nasal spray Place 2 sprays into both nostrils daily. 06/21/15   Historical Provider, MD  mirtazapine (REMERON) 15 MG tablet Take 1 tablet (15 mg total) by mouth at bedtime. 04/13/13   Niel Hummer, NP  Multiple Vitamin (MULTIVITAMIN WITH MINERALS) TABS tablet Take 1 tablet by mouth daily. Centrum Silver 04/13/13   Niel Hummer, NP  nitroGLYCERIN (NITROSTAT) 0.4 MG SL tablet Place 1 tablet (0.4 mg total) under the tongue every 5 (five) minutes as needed for chest pain. 10/31/15   Cheryln Manly, NP  pantoprazole (PROTONIX) 40 MG tablet TAKE 1 TABLET BY MOUTH EVERY DAY 30 MINUTES BEFORE BREAKFAST 03/17/15   Rogene Houston, MD  ticagrelor (BRILINTA) 90 MG TABS tablet Take 1 tablet (90 mg total) by mouth 2 (two) times daily. 10/31/15   Cheryln Manly, NP  zolpidem (AMBIEN) 10 MG tablet Take 1 tablet by mouth at bedtime as needed for sleep.  06/06/15   Historical Provider, MD    Family History No family history on file.  Social History Social History  Substance Use Topics  . Smoking status: Former Research scientist (life sciences)  . Smokeless tobacco: Never Used     Comment: quit over 3 yrs ago  . Alcohol use No     Allergies   Patient has no known allergies.   Review of Systems Review of Systems  Constitutional: Negative for appetite change and fatigue.  HENT: Negative for congestion, ear discharge and sinus pressure.        Headache  Eyes: Negative for discharge.  Respiratory: Negative for cough.   Cardiovascular: Negative for chest pain.  Gastrointestinal: Negative for abdominal pain and diarrhea.  Genitourinary: Negative for frequency and hematuria.    Musculoskeletal: Negative for back pain.  Skin: Negative for rash.  Neurological: Negative for seizures and headaches.  Psychiatric/Behavioral: Negative for hallucinations.     Physical Exam Updated Vital Signs BP 136/70 (BP Location: Left Arm)   Pulse (!) 54   Temp 97.7 F (36.5 C) (Oral)   Resp 18   Ht 5\' 11"  (1.803 m)   Wt 175 lb (79.4 kg)   SpO2 96%   BMI 24.41 kg/m   Physical Exam  Constitutional: He is oriented to person, place, and time. He appears well-developed.  HENT:  Head: Normocephalic.  3 cm superficial laceration to occipital head  Eyes: Conjunctivae and EOM are normal. No scleral icterus.  Neck: Neck supple. No thyromegaly present.  Cardiovascular: Normal rate and regular rhythm.  Exam reveals no gallop and no friction rub.   No murmur heard. Pulmonary/Chest: No stridor. He has no wheezes. He has no rales. He exhibits no tenderness.  Abdominal: He exhibits no distension. There is  no tenderness. There is no rebound.  Musculoskeletal: Normal range of motion. He exhibits no edema.  Lymphadenopathy:    He has no cervical adenopathy.  Neurological: He is oriented to person, place, and time. He exhibits normal muscle tone. Coordination normal.  Skin: No rash noted. No erythema.  Psychiatric: He has a normal mood and affect. His behavior is normal.     ED Treatments / Results  Labs (all labs ordered are listed, but only abnormal results are displayed) Labs Reviewed - No data to display  EKG  EKG Interpretation None       Radiology No results found.  Procedures .Marland KitchenLaceration Repair Date/Time: 01/07/2016 7:41 PM Performed by: Milton Ferguson Authorized by: Milton Ferguson   Comments:     Patient has a 3 cm laceration to occipital head. Area was cleaned thoroughly with Betadine. 4 staples were used to close the laceration. Patient tolerated the procedure well   (including critical care time)  Medications Ordered in ED Medications - No data to  display   Initial Impression / Assessment and Plan / ED Course  I have reviewed the triage vital signs and the nursing notes.  Pertinent labs & imaging results that were available during my care of the patient were reviewed by me and considered in my medical decision making (see chart for details).  Clinical Course     Laceration to occipital head." Sutures  Final Clinical Impressions(s) / ED Diagnoses   Final diagnoses:  Laceration of scalp, initial encounter    New Prescriptions New Prescriptions   No medications on file     Milton Ferguson, MD 01/07/16 1941

## 2016-01-14 DIAGNOSIS — F329 Major depressive disorder, single episode, unspecified: Secondary | ICD-10-CM | POA: Diagnosis not present

## 2016-01-14 DIAGNOSIS — S0191XD Laceration without foreign body of unspecified part of head, subsequent encounter: Secondary | ICD-10-CM | POA: Diagnosis not present

## 2016-01-14 DIAGNOSIS — Z6825 Body mass index (BMI) 25.0-25.9, adult: Secondary | ICD-10-CM | POA: Diagnosis not present

## 2016-01-14 DIAGNOSIS — F419 Anxiety disorder, unspecified: Secondary | ICD-10-CM | POA: Diagnosis not present

## 2016-01-14 DIAGNOSIS — F312 Bipolar disorder, current episode manic severe with psychotic features: Secondary | ICD-10-CM | POA: Diagnosis not present

## 2016-01-14 DIAGNOSIS — Z1389 Encounter for screening for other disorder: Secondary | ICD-10-CM | POA: Diagnosis not present

## 2016-01-14 DIAGNOSIS — E663 Overweight: Secondary | ICD-10-CM | POA: Diagnosis not present

## 2016-02-04 ENCOUNTER — Other Ambulatory Visit (HOSPITAL_COMMUNITY)
Admission: RE | Admit: 2016-02-04 | Discharge: 2016-02-04 | Disposition: A | Discharge status: Home / Self Care | Payer: Commercial Managed Care - HMO | Source: Ambulatory Visit | Attending: Adult Health | Admitting: Adult Health

## 2016-02-04 DIAGNOSIS — E782 Mixed hyperlipidemia: Secondary | ICD-10-CM | POA: Diagnosis not present

## 2016-02-04 LAB — BASIC METABOLIC PANEL
Anion gap: 7 (ref 5–15)
BUN: 9 mg/dL (ref 6–20)
CHLORIDE: 101 mmol/L (ref 101–111)
CO2: 29 mmol/L (ref 22–32)
Calcium: 9.7 mg/dL (ref 8.9–10.3)
Creatinine, Ser: 1.33 mg/dL — ABNORMAL HIGH (ref 0.61–1.24)
GFR calc non Af Amer: 55 mL/min — ABNORMAL LOW (ref >60)
Glucose, Bld: 100 mg/dL — ABNORMAL HIGH (ref 65–99)
POTASSIUM: 4.5 mmol/L (ref 3.5–5.1)
Sodium: 137 mmol/L (ref 135–145)

## 2016-02-04 LAB — LIPID PANEL
CHOLESTEROL: 170 mg/dL (ref 0–200)
HDL: 50 mg/dL (ref >40)
LDL Cholesterol: 83 mg/dL (ref 0–99)
Total CHOL/HDL Ratio: 3.4 RATIO
Triglycerides: 185 mg/dL — ABNORMAL HIGH (ref <150)
VLDL: 37 mg/dL (ref 0–40)

## 2016-02-05 ENCOUNTER — Ambulatory Visit (INDEPENDENT_AMBULATORY_CARE_PROVIDER_SITE_OTHER): Payer: Medicare HMO | Admitting: Cardiology

## 2016-02-05 ENCOUNTER — Encounter: Payer: Self-pay | Admitting: Cardiology

## 2016-02-05 VITALS — BP 118/68 | HR 56 | Ht 71.0 in | Wt 171.0 lb

## 2016-02-05 DIAGNOSIS — I2510 Atherosclerotic heart disease of native coronary artery without angina pectoris: Secondary | ICD-10-CM

## 2016-02-05 DIAGNOSIS — Z79899 Other long term (current) drug therapy: Secondary | ICD-10-CM

## 2016-02-05 MED ORDER — LISINOPRIL 2.5 MG PO TABS: 2.5000 mg | ORAL_TABLET | Freq: Every day | ORAL | 3 refills | Status: DC

## 2016-02-05 NOTE — Patient Instructions (Addendum)
Medication Instructions:  Start lisinopril 2.5 mg daily   Labwork: Your physician recommends that you return for lab work in: 2 weeks bmet    Testing/Procedures: none  Follow-Up: Your physician wants you to follow-up in: 6 months .  You will receive a reminder letter in the mail two months in advance. If you don't receive a letter, please call our office to schedule the follow-up appointment.   Any Other Special Instructions Will Be Listed Below (If Applicable).     If you need a refill on your cardiac medications before your next appointment, please call your pharmacy.

## 2016-02-05 NOTE — Progress Notes (Signed)
Clinical Summary Corey [name] is a 66 y.o.male last seen by NP Purcell Nails, this is our first visit together. He is seen for the following medical problems.  1. CAD - admit with inferior MI 10/2015, received DES x 2 to RCA. See cath report below. 75% LAD lesion in small part of vessel managed medically.  - 10/2015 echo LVEF 99991111, grade I diastolic dysfunction  - soft bp's limited medical therapy  - no recent chest pain. No SOB or DOE - compliant with meds.   Past Medical History:  Diagnosis Date  . Anxiety   . Arthritis   . Bipolar disorder ([place])   . Depression   . High cholesterol   . Iron deficiency anemia 12/09/2011  . Migraine   . Tinnitus      No Known Allergies   Current Outpatient Prescriptions  Medication Sig Dispense Refill  . ALPRAZolam (XANAX) 1 MG tablet Take 1 tablet by mouth 3 (three) times daily as needed for anxiety.   1  . aspirin EC 81 MG tablet Take 1 tablet (81 mg total) by mouth daily.    Marland Kitchen atorvastatin (LIPITOR) 40 MG tablet Take 1 tablet (40 mg total) by mouth daily at 6 PM. 30 tablet 6  . fluticasone (FLONASE) 50 MCG/ACT nasal spray Place 2 sprays into both nostrils daily.    Marland Kitchen HYDROcodone-acetaminophen (NORCO/VICODIN) 5-325 MG tablet Take 1 tablet by mouth every 4 (four) hours as needed for moderate pain.   0  . metoprolol tartrate (LOPRESSOR) 25 MG tablet Take 0.5 tablets (12.5 mg total) by mouth 2 (two) times daily. 60 tablet 6  . mirtazapine (REMERON) 15 MG tablet Take 1 tablet (15 mg total) by mouth at bedtime. (Patient not taking: Reported on 01/07/2016) 30 tablet 0  . Multiple Vitamin (MULTIVITAMIN WITH MINERALS) TABS tablet Take 1 tablet by mouth daily. Centrum Silver (Patient not taking: Reported on 01/07/2016)    . nitroGLYCERIN (NITROSTAT) 0.4 MG SL tablet Place 1 tablet (0.4 mg total) under the tongue every 5 (five) minutes as needed for chest pain. 25 tablet 3  . pantoprazole (PROTONIX) 40 MG tablet TAKE 1 TABLET BY MOUTH EVERY DAY 30  MINUTES BEFORE BREAKFAST 90 tablet 3  . ticagrelor (BRILINTA) 90 MG TABS tablet Take 1 tablet (90 mg total) by mouth 2 (two) times daily. 60 tablet 0  . zolpidem (AMBIEN) 10 MG tablet Take 1 tablet by mouth at bedtime as needed for sleep.      No current facility-administered medications for this visit.      Past Surgical History:  Procedure Laterality Date  . BACK SURGERY    . CARDIAC CATHETERIZATION N/A 10/29/2015   Procedure: Left Heart Cath and Coronary Angiography;  Surgeon: Jettie Booze, MD;  Location: [facility];  Service: Cardiovascular;  Laterality: N/A;  . CARDIAC CATHETERIZATION N/A 10/29/2015   Procedure: Coronary Stent Intervention;  Surgeon: Jettie Booze, MD;  Location: [facility];  Service: Cardiovascular;  Laterality: N/A;  . CATARACT EXTRACTION W/PHACO Left 07/01/2015   Procedure: CATARACT EXTRACTION PHACO AND INTRAOCULAR LENS PLACEMENT (IOC);  Surgeon: Rutherford Guys, MD;  Location: [facility];  Service: Ophthalmology;  Laterality: Left;  CDE: 4.16  . ESOPHAGOGASTRODUODENOSCOPY  01/27/2012   Procedure: ESOPHAGOGASTRODUODENOSCOPY (EGD);  Surgeon: Rogene Houston, MD;  [facility];  Service: Endoscopy;  Laterality: N/A;  350-rescheduled to 9:15 Ann notitied pt  . HAND SURGERY Bilateral   . HERNIA REPAIR    . INSERTION  OF MESH N/A 12/14/2013   Procedure: INSERTION OF MESH;  Surgeon: Jamesetta So, MD;  Location: [facility];  Service: General;  Laterality: N/A;  . MASS EXCISION Left 07/14/2012   Procedure: EXCISION SOFT TISSUE MASS LEG;  Surgeon: Jamesetta So, MD;  Location: [facility];  Service: General;  Laterality: Left;  . PERIPHERAL VASCULAR CATHETERIZATION  10/29/2015   Procedure: Thrombectomy;  Surgeon: Jettie Booze, MD;  Location: [facility];  Service: Cardiovascular;;  . ROTATOR CUFF REPAIR Bilateral   . UMBILICAL HERNIA REPAIR N/A 12/14/2013   Procedure: UMBILICAL HERNIORRHAPHY;  Surgeon: Jamesetta So, MD;  Location: [facility];   Service: General;  Laterality: N/A;     No Known Allergies    No family history on file.   Social History Corey [name] reports that he has quit smoking. He has never used smokeless tobacco. Corey [name] reports that he does not drink alcohol.   Review of Systems CONSTITUTIONAL: No weight loss, fever, chills, weakness or fatigue.  HEENT: Eyes: No visual loss, blurred vision, double vision or yellow sclerae.No hearing loss, sneezing, congestion, runny nose or sore throat.  SKIN: No rash or itching.  CARDIOVASCULAR: per HPI RESPIRATORY: No shortness of breath, cough or sputum.  GASTROINTESTINAL: No anorexia, nausea, vomiting or diarrhea. No abdominal pain or blood.  GENITOURINARY: No burning on urination, no polyuria NEUROLOGICAL: No headache, dizziness, syncope, paralysis, ataxia, numbness or tingling in the extremities. No change in bowel or bladder control.  MUSCULOSKELETAL: No muscle, back pain, joint pain or stiffness.  LYMPHATICS: No enlarged nodes. No history of splenectomy.  PSYCHIATRIC: No history of depression or anxiety.  ENDOCRINOLOGIC: No reports of sweating, cold or heat intolerance. No polyuria or polydipsia.  Marland Kitchen   Physical Examination Vitals:   02/05/16 1018  BP: 118/68  Pulse: (!) 56   Vitals:   02/05/16 1018  Weight: 171 lb (77.6 kg)  Height: 5\' 11"  (1.803 m)    Gen: resting comfortably, no acute distress HEENT: no scleral icterus, pupils equal round and reactive, no palptable cervical adenopathy,  CV: RRR, no m/r/g, no jvd Resp: Clear to auscultation bilaterally GI: abdomen is soft, non-tender, non-distended, normal bowel sounds, no hepatosplenomegaly MSK: extremities are warm, no edema.  Skin: warm, no rash Neuro:  no focal deficits Psych: appropriate affect   Diagnostic Studies 10/2015 cath  Mid LAD lesion, 75 %stenosed. This is a small vessel that does not reach the apex. Scattered disease in the left system.  The left ventricular ejection  fraction is 35-45% by visual estimate.  There is mild to moderate left ventricular systolic dysfunction.  There is no aortic valve stenosis.  Ost RPDA lesion, 90 %stenosed. A STENT SYNERGY DES [id number] drug eluting stent was successfully placed.  Post intervention, there is a 0% residual stenosis.  Distal RCA/posterolateral artery, 100 %stenosed. A STENT SYNERGY DES [id number] drug eluting stent was successfully placed.  Post intervention, there is a 0% residual stenosis.  Stents were placed in [place] fashion. Final kissing balloon inflation was performed.   Continue aggressive medical therapy including smoking cessation. He'll be watched in the ICU. Will allow him to recover from the semi-before addressing the left-sided disease.  He'll need dual antiplatelet therapy for a year. Beyond this, he'll likely need lifelong clopidogrel therapy.    Assessment and Plan  1. CAD - no current symptoms - in setting of CAD start lisiopril 2.5mg  daily. Check BMET in 2 weeks   F/u 6 months   Kary Sugrue F.  Harl Bowie, M.D.

## 2016-02-06 ENCOUNTER — Encounter (HOSPITAL_COMMUNITY): Payer: Self-pay | Admitting: Emergency Medicine

## 2016-02-06 ENCOUNTER — Emergency Department (HOSPITAL_COMMUNITY)
Admission: EM | Admit: 2016-02-06 | Discharge: 2016-02-06 | Disposition: A | Discharge status: Home / Self Care | Payer: Medicare HMO | Attending: Emergency Medicine | Admitting: Emergency Medicine

## 2016-02-06 DIAGNOSIS — W1800XA Striking against unspecified object with subsequent fall, initial encounter: Secondary | ICD-10-CM | POA: Diagnosis not present

## 2016-02-06 DIAGNOSIS — Z79899 Other long term (current) drug therapy: Secondary | ICD-10-CM | POA: Diagnosis not present

## 2016-02-06 DIAGNOSIS — Z7982 Long term (current) use of aspirin: Secondary | ICD-10-CM | POA: Diagnosis not present

## 2016-02-06 DIAGNOSIS — Y939 Activity, unspecified: Secondary | ICD-10-CM | POA: Insufficient documentation

## 2016-02-06 DIAGNOSIS — Z87891 Personal history of nicotine dependence: Secondary | ICD-10-CM | POA: Diagnosis not present

## 2016-02-06 DIAGNOSIS — I252 Old myocardial infarction: Secondary | ICD-10-CM | POA: Diagnosis not present

## 2016-02-06 DIAGNOSIS — S0990XA Unspecified injury of head, initial encounter: Secondary | ICD-10-CM | POA: Diagnosis present

## 2016-02-06 DIAGNOSIS — Y999 Unspecified external cause status: Secondary | ICD-10-CM | POA: Insufficient documentation

## 2016-02-06 DIAGNOSIS — Z23 Encounter for immunization: Secondary | ICD-10-CM | POA: Diagnosis not present

## 2016-02-06 DIAGNOSIS — N183 Chronic kidney disease, stage 3 (moderate): Secondary | ICD-10-CM | POA: Diagnosis not present

## 2016-02-06 DIAGNOSIS — Y929 Unspecified place or not applicable: Secondary | ICD-10-CM | POA: Insufficient documentation

## 2016-02-06 DIAGNOSIS — S098XXA Other specified injuries of head, initial encounter: Secondary | ICD-10-CM | POA: Diagnosis not present

## 2016-02-06 DIAGNOSIS — S0101XA Laceration without foreign body of scalp, initial encounter: Secondary | ICD-10-CM | POA: Diagnosis not present

## 2016-02-06 MED ORDER — BACITRACIN ZINC 500 UNIT/GM EX OINT
1.0000 "application " | TOPICAL_OINTMENT | Freq: Two times a day (BID) | CUTANEOUS | Status: DC
  Filled 2016-02-06: qty 0.9

## 2016-02-06 MED ORDER — IBUPROFEN 600 MG PO TABS: 600.0000 mg | ORAL_TABLET | Freq: Four times a day (QID) | ORAL | 0 refills | Status: DC | PRN

## 2016-02-06 MED ORDER — LIDOCAINE-EPINEPHRINE-TETRACAINE (LET) SOLUTION
3.0000 mL | Freq: Once | NASAL | Status: AC
  Administered 2016-02-06: 20:00:00 3 mL via TOPICAL
  Filled 2016-02-06: qty 3

## 2016-02-06 MED ORDER — LIDOCAINE-EPINEPHRINE (PF) 1 %-1:200000 IJ SOLN
INTRAMUSCULAR | Status: AC
  Filled 2016-02-06: qty 30

## 2016-02-06 MED ORDER — TETANUS-DIPHTH-ACELL PERTUSSIS 5-2.5-18.5 LF-MCG/0.5 IM SUSP
0.5000 mL | Freq: Once | INTRAMUSCULAR | Status: AC
  Administered 2016-02-06: 0.5 mL via INTRAMUSCULAR
  Filled 2016-02-06: qty 0.5

## 2016-02-06 NOTE — ED Notes (Signed)
Standing VS 97.6,66,16 Pulse ox 100 95/61  Pt started new med yesterday and took it last night

## 2016-02-06 NOTE — ED Triage Notes (Addendum)
Patient states he got up this morning and states he stood up too fast and got dizzy and fell striking head. Complains of head pain and headachePt denies weakness.

## 2016-02-06 NOTE — ED Provider Notes (Addendum)
[facility] DEPT Provider Note   CSN: [id number] Arrival date & time: 02/06/16  1141   By signing my name below, I, Charolotte Eke, attest that this documentation has been prepared under the direction and in the presence of Noemi Chapel, MD. Electronically Signed: Charolotte Eke, Scribe. 02/06/16. 8:31 PM.  History   Chief Complaint Chief Complaint  Patient presents with  . Fall    HPI Corey [name] is a 66 y.o. male who presents to the Emergency Department complaining of head laceration s/p fall that occurred when he woke up this morning 12 hours ago. He states that he fell backwards and hit his head. He denies numbness in extremities as well as any lightheadedness or syncope. Pt is on Brilinta. He denies nausea and vomiting and has not had a seizure today.   The symptoms are persistent, worse with palpation, not associated with seizures.  The history is provided by the patient. No language interpreter was used.    Past Medical History:  Diagnosis Date  . Anxiety   . Arthritis   . Bipolar disorder ([place])   . Depression   . High cholesterol   . Iron deficiency anemia 12/09/2011  . Migraine   . Tinnitus     Patient Active Problem List   Diagnosis Date Noted  . Tobacco abuse 10/30/2015  . Syncope 10/30/2015  . Acute inferolateral myocardial infarction ([place]) 10/29/2015  . Acute MI, inferolateral wall, initial episode of care ([place])   . Bipolar affective disorder, current episode manic with psychotic symptoms ([place]) 04/06/2013  . 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 Barrett's esophagus 02/28/2013  . Sepsis ([place]) 01/25/2013  . CKD (chronic kidney disease), stage III 01/25/2013  . CAP (community acquired pneumonia) 01/24/2013  . AKI (acute kidney injury) ([place]) 01/24/2013  . Acute encephalopathy 01/24/2013  . Community acquired bacterial pneumonia 01/24/2013  . SIRS (systemic inflammatory response syndrome) ([place]) 01/24/2013  . Iron deficiency  anemia 12/09/2011  . Hypercholesterolemia 12/09/2011    Past Surgical History:  Procedure Laterality Date  . BACK SURGERY    . CARDIAC CATHETERIZATION N/A 10/29/2015   Procedure: Left Heart Cath and Coronary Angiography;  Surgeon: Jettie Booze, MD;  Location: [facility];  Service: Cardiovascular;  Laterality: N/A;  . CARDIAC CATHETERIZATION N/A 10/29/2015   Procedure: Coronary Stent Intervention;  Surgeon: Jettie Booze, MD;  Location: [facility];  Service: Cardiovascular;  Laterality: N/A;  . CATARACT EXTRACTION W/PHACO Left 07/01/2015   Procedure: CATARACT EXTRACTION PHACO AND INTRAOCULAR LENS PLACEMENT (IOC);  Surgeon: Rutherford Guys, MD;  Location: [facility];  Service: Ophthalmology;  Laterality: Left;  CDE: 4.16  . ESOPHAGOGASTRODUODENOSCOPY  01/27/2012   Procedure: ESOPHAGOGASTRODUODENOSCOPY (EGD);  Surgeon: Rogene Houston, MD;  [facility];  Service: Endoscopy;  Laterality: N/A;  350-rescheduled to 9:15 Ann notitied pt  . HAND SURGERY Bilateral   . HERNIA REPAIR    . INSERTION OF MESH N/A 12/14/2013   Procedure: INSERTION OF MESH;  Surgeon: Jamesetta So, MD;  Location: [facility];  Service: General;  Laterality: N/A;  . MASS EXCISION Left 07/14/2012   Procedure: EXCISION SOFT TISSUE MASS LEG;  Surgeon: Jamesetta So, MD;  Location: [facility];  Service: General;  Laterality: Left;  . PERIPHERAL VASCULAR CATHETERIZATION  10/29/2015   Procedure: Thrombectomy;  Surgeon: Jettie Booze, MD;  Location: [facility];  Service: Cardiovascular;;  . ROTATOR CUFF REPAIR Bilateral   . UMBILICAL HERNIA REPAIR N/A 12/14/2013  Procedure: UMBILICAL HERNIORRHAPHY;  Surgeon: Jamesetta So, MD;  Location: [facility];  Service: General;  Laterality: N/A;       Home Medications    Prior to Admission medications   Medication Sig Start Date End Date Taking? Authorizing Provider  ALPRAZolam Duanne Moron) 1 MG tablet Take 1 tablet by mouth 3 (three) times daily as needed  for anxiety.  06/04/15  Yes Historical Provider, MD  aspirin EC 81 MG tablet Take 1 tablet (81 mg total) by mouth daily. 04/13/13  Yes Niel Hummer, NP  atorvastatin (LIPITOR) 40 MG tablet Take 1 tablet (40 mg total) by mouth daily at 6 PM. Patient taking differently: Take 40 mg by mouth every morning.  10/31/15  Yes Cheryln Manly, NP  fluticasone (FLONASE) 50 MCG/ACT nasal spray Place 2 sprays into both nostrils daily. 06/21/15  Yes Historical Provider, MD  HYDROcodone-acetaminophen (NORCO/VICODIN) 5-325 MG tablet Take 1 tablet by mouth every 4 (four) hours as needed for moderate pain.  05/20/15  Yes Historical Provider, MD  lisinopril (PRINIVIL,ZESTRIL) 2.5 MG tablet Take 1 tablet (2.5 mg total) by mouth daily. 02/05/16 05/05/16 Yes Arnoldo Lenis, MD  metoprolol tartrate (LOPRESSOR) 25 MG tablet Take 0.5 tablets (12.5 mg total) by mouth 2 (two) times daily. 10/31/15  Yes Cheryln Manly, NP  Multiple Vitamin (MULTIVITAMIN WITH MINERALS) TABS tablet Take 1 tablet by mouth daily. Centrum Silver 04/13/13  Yes Niel Hummer, NP  nitroGLYCERIN (NITROSTAT) 0.4 MG SL tablet Place 1 tablet (0.4 mg total) under the tongue every 5 (five) minutes as needed for chest pain. 10/31/15  Yes Cheryln Manly, NP  pantoprazole (PROTONIX) 40 MG tablet TAKE 1 TABLET BY MOUTH EVERY DAY 30 MINUTES BEFORE BREAKFAST 03/17/15  Yes Rogene Houston, MD  ticagrelor (BRILINTA) 90 MG TABS tablet Take 1 tablet (90 mg total) by mouth 2 (two) times daily. 10/31/15  Yes Cheryln Manly, NP  zolpidem (AMBIEN) 10 MG tablet Take 1 tablet by mouth at bedtime as needed for sleep.  06/06/15  Yes Historical Provider, MD  ibuprofen (ADVIL,MOTRIN) 600 MG tablet Take 1 tablet (600 mg total) by mouth every 6 (six) hours as needed. 02/06/16   Noemi Chapel, MD    Family History Family History  Problem Relation Age of Onset  . Heart attack Father   . CVA Father   . Cancer Brother     Social History Social History  Substance Use Topics    . Smoking status: Former Smoker    Types: Cigarettes    Start date: 02/04/1969    Quit date: 10/27/2015  . Smokeless tobacco: Never Used     Comment: quit over 3 yrs ago  . Alcohol use No     Allergies   Patient has no known allergies.   Review of Systems Review of Systems  Gastrointestinal: Negative for nausea and vomiting.  Skin: Positive for wound.  Neurological: Negative for seizures, syncope, light-headedness and numbness.  All other systems reviewed and are negative.    Physical Exam Updated Vital Signs BP 125/83   Pulse (!) 51   Temp 97.6 F (36.4 C) (Oral)   Resp 20   Ht 5\' 11"  (1.803 m)   Wt 171 lb (77.6 kg)   SpO2 97%   BMI 23.85 kg/m   Physical Exam  Constitutional: He appears well-developed and well-nourished.  HENT:  Head: Normocephalic and atraumatic.  3 cm zigzag laceration to the posterior scalp, superficial, no exposed galea or bone, no bleeding,  no foreign bodies  Eyes: Conjunctivae are normal. Right eye exhibits no discharge. Left eye exhibits no discharge.  Pulmonary/Chest: Effort normal. No respiratory distress.  Musculoskeletal:  No spinal tenderness of CT or lumbar spines.  Neurological: He is alert. Coordination normal.  Neurologic exam:  Speech clear, pupils equal round reactive to light, extraocular movements intact  Normal peripheral visual fields Cranial nerves III through XII normal including no facial droop Follows commands, moves all extremities x4, normal strength to bilateral upper and lower extremities at all major muscle groups including grip Sensation normal to light touch and pinprick Coordination intact, no limb ataxia, finger-nose-finger normal Rapid alternating movements normal No pronator drift Gait normal   Skin: Skin is warm and dry. No rash noted. He is not diaphoretic. No erythema.  Psychiatric: He has a normal mood and affect.  Nursing note and vitals reviewed.    ED Treatments / Results   DIAGNOSTIC  STUDIES: Oxygen Saturation is 98% on room air, normal by my interpretation.    COORDINATION OF CARE: 8:27 PM Discussed treatment plan with pt at bedside and pt agreed to plan.   Labs (all labs ordered are listed, but only abnormal results are displayed) Labs Reviewed - No data to display  EKG  EKG Interpretation None       Radiology No results found.  Procedures Procedures (including critical care time)  Medications Ordered in ED Medications  lidocaine-EPINEPHrine (XYLOCAINE-EPINEPHrine) 1 %-1:200000 (PF) injection (not administered)  Tdap (BOOSTRIX) injection 0.5 mL (not administered)  bacitracin ointment 1 application (not administered)  lidocaine-EPINEPHrine-tetracaine (LET) solution (3 mLs Topical Given 02/06/16 2007)     Initial Impression / Assessment and Plan / ED Course  I have reviewed the triage vital signs and the nursing notes.  Pertinent labs & imaging results that were available during my care of the patient were reviewed by me and considered in my medical decision making (see chart for details).  Clinical Course     The patient is on anticoagulants however despite having his injury over 12 hours ago he still has no headache, no nausea, no changes in vision or other neurologic complaints. He is not bleeding from the wound. At this time he has no neck pain or back pain or tenderness and his neurologic exam is unremarkable. Primary repair of the wound, please see below.  LACERATION REPAIR Performed by: Johnna Acosta Authorized by: Johnna Acosta Consent: Verbal consent obtained. Risks and benefits: risks, benefits and alternatives were discussed Consent given by: patient Patient identity confirmed: provided demographic data Prepped and Draped in normal sterile fashion Wound explored  Laceration Location: scalp  Laceration Length: 3cm  No Foreign Bodies seen or palpated  Anesthesia: local infiltration  Local anesthetic: lidocaine 1% with  epinephrine  Anesthetic total: 3 ml  Irrigation method: syringe Amount of cleaning: standard  Skin closure: staple  Number of sutures: 2  Technique: staple  Patient tolerance: Patient tolerated the procedure well with no immediate complications.   Final Clinical Impressions(s) / ED Diagnoses   Final diagnoses:  Injury of head, initial encounter  Laceration of scalp, initial encounter    New Prescriptions New Prescriptions   IBUPROFEN (ADVIL,MOTRIN) 600 MG TABLET    Take 1 tablet (600 mg total) by mouth every 6 (six) hours as needed.   I personally performed the services described in this documentation, which was scribed in my presence. The recorded information has been reviewed and is accurate.        Noemi Chapel,  MD 02/06/16 2111    Noemi Chapel, MD 02/06/16 2112

## 2016-02-06 NOTE — ED Notes (Signed)
Called to triage w/ no answer

## 2016-02-16 DIAGNOSIS — S0990XS Unspecified injury of head, sequela: Secondary | ICD-10-CM | POA: Diagnosis not present

## 2016-02-16 DIAGNOSIS — I2510 Atherosclerotic heart disease of native coronary artery without angina pectoris: Secondary | ICD-10-CM | POA: Diagnosis not present

## 2016-02-16 DIAGNOSIS — R55 Syncope and collapse: Secondary | ICD-10-CM | POA: Diagnosis not present

## 2016-02-16 DIAGNOSIS — Z6824 Body mass index (BMI) 24.0-24.9, adult: Secondary | ICD-10-CM | POA: Diagnosis not present

## 2016-02-16 DIAGNOSIS — Z1389 Encounter for screening for other disorder: Secondary | ICD-10-CM | POA: Diagnosis not present

## 2016-02-24 ENCOUNTER — Telehealth: Payer: Self-pay

## 2016-02-24 NOTE — Telephone Encounter (Signed)
Ok to stop lisionpril. We had started a very low dose because of the benefits for his heart however appears he is not able to tolerate even the lowest dose possible. Ok to stop. Another component of what may have happened can be dehdydration? Does he drink plenty of water daily?   Zandra Abts MD

## 2016-02-24 NOTE — Telephone Encounter (Signed)
Called pt, he stated that he was not a big water drinker. He did say that he will start drinking more water.

## 2016-02-24 NOTE — Telephone Encounter (Signed)
Pt came in to advise [place] that the morning after starting lisinopril he got up out of bed and became dizzy, lost his balance, fell and hit his head. He could not get it to stop bleeding, so he went to ED. Pt made a follow up appointment with Dr. Gerarda Fraction and was advised to stop taking lisinopril 2.5. He was advised to keep a record of hjid blood pressure regularly. I checked his bp today and it is 110/74. Pt has no complaints.

## 2016-03-02 ENCOUNTER — Encounter: Payer: Self-pay | Admitting: Internal Medicine

## 2016-03-10 DIAGNOSIS — Z6825 Body mass index (BMI) 25.0-25.9, adult: Secondary | ICD-10-CM | POA: Diagnosis not present

## 2016-03-10 DIAGNOSIS — I2510 Atherosclerotic heart disease of native coronary artery without angina pectoris: Secondary | ICD-10-CM | POA: Diagnosis not present

## 2016-03-10 DIAGNOSIS — E782 Mixed hyperlipidemia: Secondary | ICD-10-CM | POA: Diagnosis not present

## 2016-03-10 DIAGNOSIS — E663 Overweight: Secondary | ICD-10-CM | POA: Diagnosis not present

## 2016-03-10 DIAGNOSIS — J329 Chronic sinusitis, unspecified: Secondary | ICD-10-CM | POA: Diagnosis not present

## 2016-03-10 DIAGNOSIS — K219 Gastro-esophageal reflux disease without esophagitis: Secondary | ICD-10-CM | POA: Diagnosis not present

## 2016-03-10 DIAGNOSIS — Z1389 Encounter for screening for other disorder: Secondary | ICD-10-CM | POA: Diagnosis not present

## 2016-04-07 ENCOUNTER — Encounter (INDEPENDENT_AMBULATORY_CARE_PROVIDER_SITE_OTHER): Payer: Self-pay | Admitting: Internal Medicine

## 2016-04-27 DIAGNOSIS — Z6825 Body mass index (BMI) 25.0-25.9, adult: Secondary | ICD-10-CM | POA: Diagnosis not present

## 2016-04-27 DIAGNOSIS — Z0000 Encounter for general adult medical examination without abnormal findings: Secondary | ICD-10-CM | POA: Diagnosis not present

## 2016-04-28 ENCOUNTER — Other Ambulatory Visit: Payer: Self-pay | Admitting: Cardiology

## 2016-04-30 ENCOUNTER — Ambulatory Visit (INDEPENDENT_AMBULATORY_CARE_PROVIDER_SITE_OTHER): Payer: Medicare HMO | Admitting: Internal Medicine

## 2016-04-30 ENCOUNTER — Encounter (INDEPENDENT_AMBULATORY_CARE_PROVIDER_SITE_OTHER): Payer: Self-pay | Admitting: Internal Medicine

## 2016-04-30 ENCOUNTER — Telehealth: Payer: Self-pay | Admitting: Adult Health

## 2016-04-30 VITALS — BP 110/70 | HR 64 | Ht 71.0 in | Wt 180.6 lb

## 2016-04-30 DIAGNOSIS — K219 Gastro-esophageal reflux disease without esophagitis: Secondary | ICD-10-CM

## 2016-04-30 DIAGNOSIS — K2270 Barrett's esophagus without dysplasia: Secondary | ICD-10-CM | POA: Diagnosis not present

## 2016-04-30 NOTE — Telephone Encounter (Signed)
No , he is not on any potassium other than the OTC he bought at the store.He takes no diuretics either. He walked into today after he had already purchased the potassium.

## 2016-04-30 NOTE — Telephone Encounter (Signed)
Patient is asking if it's okay that he takes Potassium OTC vitamin. Please call. / tg

## 2016-04-30 NOTE — Telephone Encounter (Signed)
Will forward to Dr Branch for dispo 

## 2016-04-30 NOTE — Progress Notes (Signed)
Subjective:    Patient ID: Corey [name], male    DOB: Dec 10, 1950, 66 y.o.   MRN: [id number]  HPI Here today for f/u. Last seen in March of 2014.  Hx of GERD complicated by Barrett's esophagus.  Recent hx of MI in October and received 2 stents at [facility].  He tells me he is doing good. He is eating healthy.  He has lost 17 pounds which was intentional. He has a BM daily. He walks some. He plays golf.  GERD controlled with Protonix.  01/27/2012 EGD: Impression:  3 cm long segment of Barrett's esophagus proximal margin and 34 cm from the incisors.  No evidence of erosive esophagitis.  Small sliding hiatal hernia with GEJ at 37 cm.  Suspect iron deficiency anemia may be secondary to blood donation. Biopsy:  Notes Recorded by Rogene Houston, MD on 02/07/2012 at 2:58 PM Biopsy results reviewed with patient. Barrett's esophagus without dysplasia.   Review of Systems Past Medical History:  Diagnosis Date  . Anxiety   . Arthritis   . Bipolar disorder ([place])   . Depression   . High cholesterol   . Iron deficiency anemia 12/09/2011  . Migraine   . Tinnitus     Past Surgical History:  Procedure Laterality Date  . BACK SURGERY    . CARDIAC CATHETERIZATION N/A 10/29/2015   Procedure: Left Heart Cath and Coronary Angiography;  Surgeon: Jettie Booze, MD;  Location: [facility];  Service: Cardiovascular;  Laterality: N/A;  . CARDIAC CATHETERIZATION N/A 10/29/2015   Procedure: Coronary Stent Intervention;  Surgeon: Jettie Booze, MD;  Location: [facility];  Service: Cardiovascular;  Laterality: N/A;  . CATARACT EXTRACTION W/PHACO Left 07/01/2015   Procedure: CATARACT EXTRACTION PHACO AND INTRAOCULAR LENS PLACEMENT (IOC);  Surgeon: Rutherford Guys, MD;  Location: [facility];  Service: Ophthalmology;  Laterality: Left;  CDE: 4.16  . ESOPHAGOGASTRODUODENOSCOPY  01/27/2012   Procedure: ESOPHAGOGASTRODUODENOSCOPY (EGD);  Surgeon: Rogene Houston, MD;  [facility];   Service: Endoscopy;  Laterality: N/A;  350-rescheduled to 9:15 Ann notitied pt  . HAND SURGERY Bilateral   . HERNIA REPAIR    . INSERTION OF MESH N/A 12/14/2013   Procedure: INSERTION OF MESH;  Surgeon: Jamesetta So, MD;  [facility]: [facility];  Service: General;  Laterality: N/A;  . MASS EXCISION Left 07/14/2012   Procedure: EXCISION SOFT TISSUE MASS LEG;  Surgeon: Jamesetta So, MD;  Location: [facility];  Service: General;  Laterality: Left;  . PERIPHERAL VASCULAR CATHETERIZATION  10/29/2015   Procedure: Thrombectomy;  Surgeon: Jettie Booze, MD;  Location: [place] [facility];  Service: Cardiovascular;;  . ROTATOR CUFF REPAIR Bilateral   . UMBILICAL HERNIA REPAIR N/A 12/14/2013   Procedure: UMBILICAL HERNIORRHAPHY;  Surgeon: Jamesetta So, MD;  Location: [facility];  Service: General;  Laterality: N/A;    No Known Allergies  Current Outpatient Prescriptions on File Prior to Visit  Medication Sig Dispense Refill  . ALPRAZolam (XANAX) 1 MG tablet Take 1 tablet by mouth 3 (three) times daily as needed for anxiety.   1  . aspirin EC 81 MG tablet Take 1 tablet (81 mg total) by mouth daily.    Marland Kitchen atorvastatin (LIPITOR) 40 MG tablet TAKE 1 TABLET(40 MG) BY MOUTH DAILY AT 6 PM 90 tablet 3  . fluticasone (FLONASE) 50 MCG/ACT nasal spray Place 2 sprays into both nostrils daily.    Marland Kitchen HYDROcodone-acetaminophen (NORCO/VICODIN) 5-325 MG tablet Take 1 tablet  by mouth every 4 (four) hours as needed for moderate pain.   0  . metoprolol tartrate (LOPRESSOR) 25 MG tablet Take 0.5 tablets (12.5 mg total) by mouth 2 (two) times daily. 60 tablet 6  . Multiple Vitamin (MULTIVITAMIN WITH MINERALS) TABS tablet Take 1 tablet by mouth daily. Centrum Silver    . nitroGLYCERIN (NITROSTAT) 0.4 MG SL tablet Place 1 tablet (0.4 mg total) under the tongue every 5 (five) minutes as needed for chest pain. 25 tablet 3  . pantoprazole (PROTONIX) 40 MG tablet TAKE 1 TABLET BY MOUTH EVERY DAY 30 MINUTES BEFORE BREAKFAST 90 tablet 3   . ticagrelor (BRILINTA) 90 MG TABS tablet Take 1 tablet (90 mg total) by mouth 2 (two) times daily. 60 tablet 0  . zolpidem (AMBIEN) 10 MG tablet Take 1 tablet by mouth at bedtime as needed for sleep.      No current facility-administered medications on file prior to visit.        Objective:   Physical Exam Blood pressure 110/70, pulse 64, height 5\' 11"  (1.803 m), weight 180 lb 9.6 oz (81.9 kg).  Alert and oriented. Skin warm and dry. Oral mucosa is moist.   . Sclera anicteric, conjunctivae is pink. Thyroid not enlarged. No cervical lymphadenopathy. Lungs clear. Heart regular rate and rhythm.  Abdomen is soft. Bowel sounds are positive. No hepatomegaly. No abdominal masses felt. No tenderness.  No edema to lower extremities.         Assessment & Plan:  GERD complicated by Barrett's. Continue the Protonix. OV in 1 year. He is doing well.

## 2016-04-30 NOTE — Telephone Encounter (Signed)
His chart indicates he is already taking a potassium supplement, please clarify. His last few blood tests measuring his potassium have been ok, there is no strong indication for additional supplement   JBranch MD

## 2016-04-30 NOTE — Patient Instructions (Signed)
Continue the Protonix. OV in 1 year.  

## 2016-05-09 ENCOUNTER — Other Ambulatory Visit (INDEPENDENT_AMBULATORY_CARE_PROVIDER_SITE_OTHER): Payer: Self-pay | Admitting: Internal Medicine

## 2016-05-14 ENCOUNTER — Other Ambulatory Visit: Payer: Self-pay | Admitting: Cardiology

## 2016-05-18 DIAGNOSIS — M545 Low back pain: Secondary | ICD-10-CM | POA: Diagnosis not present

## 2016-05-18 DIAGNOSIS — E782 Mixed hyperlipidemia: Secondary | ICD-10-CM | POA: Diagnosis not present

## 2016-05-18 DIAGNOSIS — K219 Gastro-esophageal reflux disease without esophagitis: Secondary | ICD-10-CM | POA: Diagnosis not present

## 2016-05-18 DIAGNOSIS — F329 Major depressive disorder, single episode, unspecified: Secondary | ICD-10-CM | POA: Diagnosis not present

## 2016-06-03 DIAGNOSIS — F319 Bipolar disorder, unspecified: Secondary | ICD-10-CM | POA: Diagnosis not present

## 2016-06-03 DIAGNOSIS — Z6825 Body mass index (BMI) 25.0-25.9, adult: Secondary | ICD-10-CM | POA: Diagnosis not present

## 2016-06-03 DIAGNOSIS — F419 Anxiety disorder, unspecified: Secondary | ICD-10-CM | POA: Diagnosis not present

## 2016-06-03 DIAGNOSIS — Z1389 Encounter for screening for other disorder: Secondary | ICD-10-CM | POA: Diagnosis not present

## 2016-06-03 DIAGNOSIS — K219 Gastro-esophageal reflux disease without esophagitis: Secondary | ICD-10-CM | POA: Diagnosis not present

## 2016-06-03 DIAGNOSIS — G4700 Insomnia, unspecified: Secondary | ICD-10-CM | POA: Diagnosis not present

## 2016-06-03 DIAGNOSIS — F331 Major depressive disorder, recurrent, moderate: Secondary | ICD-10-CM | POA: Diagnosis not present

## 2016-06-03 DIAGNOSIS — E663 Overweight: Secondary | ICD-10-CM | POA: Diagnosis not present

## 2016-06-03 DIAGNOSIS — E669 Obesity, unspecified: Secondary | ICD-10-CM | POA: Diagnosis not present

## 2016-06-04 DIAGNOSIS — R350 Frequency of micturition: Secondary | ICD-10-CM | POA: Diagnosis not present

## 2016-06-23 DIAGNOSIS — Z1389 Encounter for screening for other disorder: Secondary | ICD-10-CM | POA: Diagnosis not present

## 2016-06-23 DIAGNOSIS — Z6825 Body mass index (BMI) 25.0-25.9, adult: Secondary | ICD-10-CM | POA: Diagnosis not present

## 2016-06-23 DIAGNOSIS — K219 Gastro-esophageal reflux disease without esophagitis: Secondary | ICD-10-CM | POA: Diagnosis not present

## 2016-06-23 DIAGNOSIS — G894 Chronic pain syndrome: Secondary | ICD-10-CM | POA: Diagnosis not present

## 2016-06-23 DIAGNOSIS — D492 Neoplasm of unspecified behavior of bone, soft tissue, and skin: Secondary | ICD-10-CM | POA: Diagnosis not present

## 2016-06-28 DIAGNOSIS — L821 Other seborrheic keratosis: Secondary | ICD-10-CM | POA: Diagnosis not present

## 2016-06-28 DIAGNOSIS — L570 Actinic keratosis: Secondary | ICD-10-CM | POA: Diagnosis not present

## 2016-06-28 DIAGNOSIS — D225 Melanocytic nevi of trunk: Secondary | ICD-10-CM | POA: Diagnosis not present

## 2016-06-28 DIAGNOSIS — X32XXXA Exposure to sunlight, initial encounter: Secondary | ICD-10-CM | POA: Diagnosis not present

## 2016-07-19 DIAGNOSIS — M064 Inflammatory polyarthropathy: Secondary | ICD-10-CM | POA: Diagnosis not present

## 2016-07-19 DIAGNOSIS — M25569 Pain in unspecified knee: Secondary | ICD-10-CM | POA: Diagnosis not present

## 2016-07-19 DIAGNOSIS — M2550 Pain in unspecified joint: Secondary | ICD-10-CM | POA: Diagnosis not present

## 2016-07-19 DIAGNOSIS — Z6824 Body mass index (BMI) 24.0-24.9, adult: Secondary | ICD-10-CM | POA: Diagnosis not present

## 2016-07-19 DIAGNOSIS — E782 Mixed hyperlipidemia: Secondary | ICD-10-CM | POA: Diagnosis not present

## 2016-07-19 DIAGNOSIS — K219 Gastro-esophageal reflux disease without esophagitis: Secondary | ICD-10-CM | POA: Diagnosis not present

## 2016-08-02 DIAGNOSIS — Z6824 Body mass index (BMI) 24.0-24.9, adult: Secondary | ICD-10-CM | POA: Diagnosis not present

## 2016-08-02 DIAGNOSIS — M25461 Effusion, right knee: Secondary | ICD-10-CM | POA: Diagnosis not present

## 2016-08-02 DIAGNOSIS — Z125 Encounter for screening for malignant neoplasm of prostate: Secondary | ICD-10-CM | POA: Diagnosis not present

## 2016-08-02 DIAGNOSIS — F419 Anxiety disorder, unspecified: Secondary | ICD-10-CM | POA: Diagnosis not present

## 2016-08-02 DIAGNOSIS — M1991 Primary osteoarthritis, unspecified site: Secondary | ICD-10-CM | POA: Diagnosis not present

## 2016-08-02 DIAGNOSIS — G894 Chronic pain syndrome: Secondary | ICD-10-CM | POA: Diagnosis not present

## 2016-08-02 DIAGNOSIS — Z1389 Encounter for screening for other disorder: Secondary | ICD-10-CM | POA: Diagnosis not present

## 2016-08-02 DIAGNOSIS — M2550 Pain in unspecified joint: Secondary | ICD-10-CM | POA: Diagnosis not present

## 2016-08-07 ENCOUNTER — Other Ambulatory Visit: Payer: Self-pay | Admitting: Cardiology

## 2016-08-17 ENCOUNTER — Encounter (HOSPITAL_COMMUNITY): Payer: Self-pay | Admitting: Emergency Medicine

## 2016-08-17 ENCOUNTER — Observation Stay (HOSPITAL_COMMUNITY)
Admission: EM | Admit: 2016-08-17 | Discharge: 2016-08-18 | Disposition: A | Discharge status: Home / Self Care | Payer: Medicare HMO | Attending: Internal Medicine | Admitting: Internal Medicine

## 2016-08-17 ENCOUNTER — Emergency Department (HOSPITAL_COMMUNITY): Payer: Medicare HMO

## 2016-08-17 DIAGNOSIS — I2510 Atherosclerotic heart disease of native coronary artery without angina pectoris: Secondary | ICD-10-CM | POA: Diagnosis not present

## 2016-08-17 DIAGNOSIS — R5383 Other fatigue: Secondary | ICD-10-CM | POA: Diagnosis not present

## 2016-08-17 DIAGNOSIS — N183 Chronic kidney disease, stage 3 unspecified: Secondary | ICD-10-CM | POA: Diagnosis present

## 2016-08-17 DIAGNOSIS — R531 Weakness: Secondary | ICD-10-CM

## 2016-08-17 DIAGNOSIS — F312 Bipolar disorder, current episode manic severe with psychotic features: Secondary | ICD-10-CM | POA: Diagnosis not present

## 2016-08-17 DIAGNOSIS — I959 Hypotension, unspecified: Secondary | ICD-10-CM | POA: Insufficient documentation

## 2016-08-17 DIAGNOSIS — Z87891 Personal history of nicotine dependence: Secondary | ICD-10-CM | POA: Insufficient documentation

## 2016-08-17 DIAGNOSIS — Z79899 Other long term (current) drug therapy: Secondary | ICD-10-CM | POA: Insufficient documentation

## 2016-08-17 DIAGNOSIS — E7800 Pure hypercholesterolemia, unspecified: Secondary | ICD-10-CM | POA: Diagnosis present

## 2016-08-17 DIAGNOSIS — Z7982 Long term (current) use of aspirin: Secondary | ICD-10-CM | POA: Insufficient documentation

## 2016-08-17 DIAGNOSIS — R4182 Altered mental status, unspecified: Principal | ICD-10-CM | POA: Insufficient documentation

## 2016-08-17 DIAGNOSIS — M6281 Muscle weakness (generalized): Secondary | ICD-10-CM | POA: Diagnosis present

## 2016-08-17 DIAGNOSIS — R410 Disorientation, unspecified: Secondary | ICD-10-CM

## 2016-08-17 DIAGNOSIS — Z7902 Long term (current) use of antithrombotics/antiplatelets: Secondary | ICD-10-CM | POA: Diagnosis not present

## 2016-08-17 HISTORY — DX: Atherosclerotic heart disease of native coronary artery without angina pectoris: I25.10

## 2016-08-17 HISTORY — DX: Gastro-esophageal reflux disease without esophagitis: K21.9

## 2016-08-17 LAB — CBC
HEMATOCRIT: 38.1 % — AB (ref 39.0–52.0)
Hemoglobin: 12.4 g/dL — ABNORMAL LOW (ref 13.0–17.0)
MCH: 25.9 pg — ABNORMAL LOW (ref 26.0–34.0)
MCHC: 32.5 g/dL (ref 30.0–36.0)
MCV: 79.7 fL (ref 78.0–100.0)
Platelets: 271 10*3/uL (ref 150–400)
RBC: 4.78 MIL/uL (ref 4.22–5.81)
RDW: 15.7 % — AB (ref 11.5–15.5)
WBC: 10.5 10*3/uL (ref 4.0–10.5)

## 2016-08-17 LAB — HEPATIC FUNCTION PANEL
ALBUMIN: 4.1 g/dL (ref 3.5–5.0)
ALT: 28 U/L (ref 17–63)
AST: 28 U/L (ref 15–41)
Alkaline Phosphatase: 51 U/L (ref 38–126)
BILIRUBIN TOTAL: 0.6 mg/dL (ref 0.3–1.2)
Bilirubin, Direct: 0.1 mg/dL (ref 0.1–0.5)
Indirect Bilirubin: 0.5 mg/dL (ref 0.3–0.9)
Total Protein: 6.7 g/dL (ref 6.5–8.1)

## 2016-08-17 LAB — RAPID URINE DRUG SCREEN, HOSP PERFORMED
Amphetamines: NOT DETECTED
BARBITURATES: NOT DETECTED
BENZODIAZEPINES: POSITIVE — AB
COCAINE: NOT DETECTED
Opiates: NOT DETECTED
Tetrahydrocannabinol: POSITIVE — AB

## 2016-08-17 LAB — BASIC METABOLIC PANEL
Anion gap: 5 (ref 5–15)
BUN: 7 mg/dL (ref 6–20)
CHLORIDE: 103 mmol/L (ref 101–111)
CO2: 29 mmol/L (ref 22–32)
Calcium: 9.2 mg/dL (ref 8.9–10.3)
Creatinine, Ser: 1.09 mg/dL (ref 0.61–1.24)
GFR calc Af Amer: >60 mL/min (ref >60)
GFR calc non Af Amer: >60 mL/min (ref >60)
GLUCOSE: 97 mg/dL (ref 65–99)
POTASSIUM: 4.3 mmol/L (ref 3.5–5.1)
SODIUM: 137 mmol/L (ref 135–145)

## 2016-08-17 LAB — URINALYSIS, ROUTINE W REFLEX MICROSCOPIC
Bilirubin Urine: NEGATIVE
GLUCOSE, UA: NEGATIVE mg/dL
Hgb urine dipstick: NEGATIVE
KETONES UR: NEGATIVE mg/dL
LEUKOCYTES UA: NEGATIVE
Nitrite: NEGATIVE
Protein, ur: NEGATIVE mg/dL
Specific Gravity, Urine: 1.009 (ref 1.005–1.030)
pH: 7 (ref 5.0–8.0)

## 2016-08-17 LAB — LACTIC ACID, PLASMA
LACTIC ACID, VENOUS: 1.4 mmol/L (ref 0.5–1.9)
Lactic Acid, Venous: 1.6 mmol/L (ref 0.5–1.9)

## 2016-08-17 LAB — CBG MONITORING, ED: Glucose-Capillary: 127 mg/dL — ABNORMAL HIGH (ref 65–99)

## 2016-08-17 LAB — TROPONIN I: Troponin I: <0.03 ng/mL (ref <0.03)

## 2016-08-17 MED ORDER — SODIUM CHLORIDE 0.9 % IV BOLUS (SEPSIS)
500.0000 mL | Freq: Once | INTRAVENOUS | Status: AC
  Administered 2016-08-17: 500 mL via INTRAVENOUS

## 2016-08-17 MED ORDER — ATORVASTATIN CALCIUM 40 MG PO TABS: 40.0000 mg | ORAL_TABLET | Freq: Every day | ORAL | Status: DC

## 2016-08-17 MED ORDER — PANTOPRAZOLE SODIUM 40 MG PO TBEC
40.0000 mg | DELAYED_RELEASE_TABLET | Freq: Every day | ORAL | Status: DC
  Administered 2016-08-18: 40 mg via ORAL
  Filled 2016-08-17: qty 1

## 2016-08-17 MED ORDER — TRAZODONE HCL 50 MG PO TABS
50.0000 mg | ORAL_TABLET | Freq: Every day | ORAL | Status: DC
  Administered 2016-08-17: 50 mg via ORAL
  Filled 2016-08-17: qty 1

## 2016-08-17 MED ORDER — ASPIRIN EC 81 MG PO TBEC
81.0000 mg | DELAYED_RELEASE_TABLET | Freq: Every day | ORAL | Status: DC
  Administered 2016-08-18: 81 mg via ORAL
  Filled 2016-08-17: qty 1

## 2016-08-17 MED ORDER — METOPROLOL TARTRATE 25 MG PO TABS
25.0000 mg | ORAL_TABLET | Freq: Two times a day (BID) | ORAL | Status: DC
  Filled 2016-08-17: qty 1

## 2016-08-17 MED ORDER — ARIPIPRAZOLE 10 MG PO TABS
5.0000 mg | ORAL_TABLET | Freq: Every day | ORAL | Status: DC
  Administered 2016-08-18: 5 mg via ORAL
  Filled 2016-08-17: qty 1

## 2016-08-17 MED ORDER — TICAGRELOR 90 MG PO TABS
90.0000 mg | ORAL_TABLET | Freq: Two times a day (BID) | ORAL | Status: DC
  Administered 2016-08-17 – 2016-08-18 (×2): 90 mg via ORAL
  Filled 2016-08-17 (×2): qty 1

## 2016-08-17 MED ORDER — HEPARIN SODIUM (PORCINE) 5000 UNIT/ML IJ SOLN: 5000.0000 [IU] | Freq: Three times a day (TID) | INTRAMUSCULAR | Status: DC

## 2016-08-17 MED ORDER — FLUTICASONE PROPIONATE 50 MCG/ACT NA SUSP
2.0000 | Freq: Every day | NASAL | Status: DC
  Administered 2016-08-18: 2 via NASAL
  Filled 2016-08-17: qty 16

## 2016-08-17 MED ORDER — ADULT MULTIVITAMIN W/MINERALS CH
1.0000 | ORAL_TABLET | Freq: Every day | ORAL | Status: DC
  Administered 2016-08-18: 1 via ORAL
  Filled 2016-08-17: qty 1

## 2016-08-17 MED ORDER — SODIUM CHLORIDE 0.9 % IV SOLN
INTRAVENOUS | Status: DC
  Administered 2016-08-18: 06:00:00 via INTRAVENOUS

## 2016-08-17 NOTE — ED Notes (Signed)
Pt ambulating in room without difficulty.

## 2016-08-17 NOTE — ED Provider Notes (Signed)
[facility] DEPT Provider Note   CSN: [id number] Arrival date & time: 08/17/16  1222     History   Chief Complaint Chief Complaint  Patient presents with  . Generalized Body Aches    HPI Corey [name] is a 66 y.o. male.  HPI  Pt was seen at 1530. Per pt, c/o gradual onset and resolution of one episode of generalized weakness that occurred when he woke up this morning approximately 0600. Pt states he woke up and "had a hard time getting out of bed" because he "felt weak all over." States he "couldn't flip the covers off of me" or "climb out of bed." States he "had chills" and "aches all over."  Pt states he "finally" was able to get out of bed and walk to his bathroom. Pt states he brushed his teeth and "felt better." Pt states he continues to "feel better" and is at his baseline. Denies fevers, no CP/palpitations, no SOB/cough, no abd pain, no N/V/D, no back pain, no visual changes, no focal motor weakness, no tingling/numbness in extremities, no ataxia, no slurred speech, no facial droop.    Past Medical History:  Diagnosis Date  . Anxiety   . Arthritis   . Bipolar disorder ([place])   . Coronary artery disease   . Depression   . GERD (gastroesophageal reflux disease)   . High cholesterol   . Iron deficiency anemia 12/09/2011  . Migraine   . Tinnitus     Patient Active Problem List   Diagnosis Date Noted  . Tobacco abuse 10/30/2015  . Syncope 10/30/2015  . Acute inferolateral myocardial infarction ([place]) 10/29/2015  . Acute MI, inferolateral wall, initial episode of care ([place])   . Bipolar affective disorder, current episode manic with psychotic symptoms ([place]) 04/06/2013  . 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 Barrett's esophagus 02/28/2013  . Sepsis ([place]) 01/25/2013  . CKD (chronic kidney disease), stage III 01/25/2013  . CAP (community acquired pneumonia) 01/24/2013  . AKI (acute kidney injury) ([place]) 01/24/2013  . Acute  encephalopathy 01/24/2013  . Community acquired bacterial pneumonia 01/24/2013  . SIRS (systemic inflammatory response syndrome) ([place]) 01/24/2013  . Iron deficiency anemia 12/09/2011  . Hypercholesterolemia 12/09/2011    Past Surgical History:  Procedure Laterality Date  . BACK SURGERY    . CARDIAC CATHETERIZATION N/A 10/29/2015   Procedure: Left Heart Cath and Coronary Angiography;  Surgeon: Jettie Booze, MD;  Location: [facility];  Service: Cardiovascular;  Laterality: N/A;  . CARDIAC CATHETERIZATION N/A 10/29/2015   Procedure: Coronary Stent Intervention;  Surgeon: Jettie Booze, MD;  Location: [facility];  Service: Cardiovascular;  Laterality: N/A;  . CATARACT EXTRACTION W/PHACO Left 07/01/2015   Procedure: CATARACT EXTRACTION PHACO AND INTRAOCULAR LENS PLACEMENT (IOC);  Surgeon: Rutherford Guys, MD;  Location: [facility];  Service: Ophthalmology;  Laterality: Left;  CDE: 4.16  . ESOPHAGOGASTRODUODENOSCOPY  01/27/2012   Procedure: ESOPHAGOGASTRODUODENOSCOPY (EGD);  Surgeon: Rogene Houston, MD;  Location: [facility];  Service: Endoscopy;  Laterality: N/A;  350-rescheduled to 9:15 Ann notitied pt  . HAND SURGERY Bilateral   . HERNIA REPAIR    . INSERTION OF MESH N/A 12/14/2013   Procedure: INSERTION OF MESH;  Surgeon: Jamesetta So, MD;  Location: [facility];  Service: General;  Laterality: N/A;  . MASS EXCISION Left 07/14/2012   Procedure: EXCISION SOFT TISSUE MASS LEG;  Surgeon: Jamesetta So, MD;  Location: [facility];  Service: General;  Laterality: Left;  .  PERIPHERAL VASCULAR CATHETERIZATION  10/29/2015   Procedure: Thrombectomy;  Surgeon: Jettie Booze, MD;  Location: [facility];  Service: Cardiovascular;;  . ROTATOR CUFF REPAIR Bilateral   . UMBILICAL HERNIA REPAIR N/A 12/14/2013   Procedure: UMBILICAL HERNIORRHAPHY;  Surgeon: Jamesetta So, MD;  Location: [facility];  Service: General;  Laterality: N/A;       Home Medications    Prior to Admission  medications   Medication Sig Start Date End Date Taking? Authorizing Provider  ALPRAZolam Duanne Moron) 1 MG tablet Take 1 tablet by mouth 3 (three) times daily as needed for anxiety.  06/04/15   [provider]  aspirin EC 81 MG tablet Take 1 tablet (81 mg total) by mouth daily. 04/13/13   Niel Hummer, NP  atorvastatin (LIPITOR) 40 MG tablet TAKE 1 TABLET(40 MG) BY MOUTH DAILY AT 6 PM 04/28/16   Arnoldo Lenis, MD  BRILINTA 90 MG TABS tablet TAKE 1 TABLET BY MOUTH TWICE DAILY 05/14/16   Arnoldo Lenis, MD  fluticasone (FLONASE) 50 MCG/ACT nasal spray Place 2 sprays into both nostrils daily. 06/21/15   [provider]  HYDROcodone-acetaminophen (NORCO/VICODIN) 5-325 MG tablet Take 1 tablet by mouth every 4 (four) hours as needed for moderate pain.  05/20/15   [provider]  metoprolol tartrate (LOPRESSOR) 25 MG tablet TAKE 1/2 TABLET(12.5 MG) BY MOUTH TWICE DAILY 08/09/16   Arnoldo Lenis, MD  Multiple Vitamin (MULTIVITAMIN WITH MINERALS) TABS tablet Take 1 tablet by mouth daily. Centrum Silver 04/13/13   Niel Hummer, NP  nitroGLYCERIN (NITROSTAT) 0.4 MG SL tablet Place 1 tablet (0.4 mg total) under the tongue every 5 (five) minutes as needed for chest pain. 10/31/15   Cheryln Manly, NP  pantoprazole (PROTONIX) 40 MG tablet TAKE 1 TABLET BY MOUTH EVERY DAY 30 MINUTES BEFORE BREAKFAST 05/10/16   Rehman, Mechele Dawley, MD  Potassium 99 MG TABS Take by mouth.    [provider]  zolpidem (AMBIEN) 10 MG tablet Take 1 tablet by mouth at bedtime as needed for sleep.  06/06/15   [provider]    Family History Family History  Problem Relation Age of Onset  . Heart attack Father   . CVA Father   . Cancer Brother     Social History Social History  Substance Use Topics  . Smoking status: Former Smoker    Types: Cigarettes    Start date: 02/04/1969    Quit date: 10/27/2015  . Smokeless tobacco: Never Used     Comment: quit over 3 yrs ago  . Alcohol  use No     Allergies   Patient has no known allergies.   Review of Systems Review of Systems ROS: Statement: All systems negative except as marked or noted in the HPI; Constitutional: Negative for fever and +chills, +generalized body aches, +generalized weakness/fatigue.; ; Eyes: Negative for eye pain, redness and discharge. ; ; ENMT: Negative for ear pain, hoarseness, nasal congestion, sinus pressure and sore throat. ; ; Cardiovascular: Negative for chest pain, palpitations, diaphoresis, dyspnea and peripheral edema. ; ; Respiratory: Negative for cough, wheezing and stridor. ; ; Gastrointestinal: Negative for nausea, vomiting, diarrhea, abdominal pain, blood in stool, hematemesis, jaundice and rectal bleeding. . ; ; Genitourinary: Negative for dysuria, flank pain and hematuria. ; ; Musculoskeletal: Negative for back pain and neck pain. Negative for swelling and trauma.; ; Skin: Negative for pruritus, rash, abrasions, blisters, bruising and skin lesion.; ; Neuro: Negative for headache, lightheadedness  and neck stiffness. Negative for altered level of consciousness, altered mental status, extremity weakness, paresthesias, involuntary movement, seizure and syncope.       Physical Exam Updated Vital Signs BP (!) 115/59 (BP Location: Right Arm)   Pulse (!) 51   Temp 97.7 F (36.5 C) (Oral)   Resp 20   Ht 5\' 11"  (1.803 m)   Wt 74.8 kg (165 lb)   SpO2 100%   BMI 23.01 kg/m   15:54 Orthostatic Vital Signs TS  Orthostatic Lying   BP- Lying: 118/57  Pulse- Lying:  48      Orthostatic Sitting  BP- Sitting: 114/69  Pulse- Sitting: 56      Orthostatic Standing at 0 minutes  BP- Standing at 0 minutes: 107/62  Pulse- Standing at 0 minutes: 51    Patient Vitals for the past 24 hrs:  BP Temp Temp src Pulse Resp SpO2 Height Weight  08/17/16 2039 104/64 - - (!) 50 15 100 % - -  08/17/16 2000 (!) 149/71 - - (!) 49 14 100 % - -  08/17/16 1941 (!) 144/70 - - (!) 49 15 100 % - -  08/17/16  1859 123/64 - - (!) 56 18 98 % - -  08/17/16 1847 123/64 - - - - - - -  08/17/16 1615 - - - - - 100 % - -  08/17/16 1600 115/63 - - - 16 - - -  08/17/16 1258 (!) 115/59 97.7 F (36.5 C) Oral (!) 51 20 100 % 5\' 11"  (1.803 m) 74.8 kg (165 lb)     Physical Exam 1535: Physical examination:  Nursing notes reviewed; Vital signs and O2 SAT reviewed;  Constitutional: Well developed, Well nourished, Well hydrated, In no acute distress; Head:  Normocephalic, atraumatic; Eyes: EOMI, PERRL, No scleral icterus; ENMT: TM's clear bilat. +edemetous nasal turbinates bilat with clear rhinorrhea. Mouth and pharynx normal, Mucous membranes moist; Neck: Supple, no meningeal signs. Full range of motion, No lymphadenopathy; Cardiovascular: Regular rate and rhythm, No gallop; Respiratory: Breath sounds clear & equal bilaterally, No wheezes.  Speaking full sentences with ease, Normal respiratory effort/excursion; Chest: Nontender, Movement normal; Abdomen: Soft, Nontender, Nondistended, Normal bowel sounds; Genitourinary: No CVA tenderness; Extremities: Pulses normal, No tenderness, No edema, No calf edema or asymmetry.; Neuro: AA&Ox3, vague historian. Major CN grossly intact. Speech clear.  No facial droop.  No nystagmus. Grips equal. Strength 5/5 equal bilat UE's and LE's.  DTR 2/4 equal bilat UE's and LE's.  No gross sensory deficits.  Normal cerebellar testing bilat UE's (finger-nose) and LE's (heel-shin). Climbs on and off stretcher easily by himself. Gait steady.; Skin: Color normal, Warm, Dry.   ED Treatments / Results  Labs (all labs ordered are listed, but only abnormal results are displayed)   EKG  EKG Interpretation  Date/Time:  Tuesday August 17 2016 15:53:59 EDT Ventricular Rate:  51 PR Interval:    QRS Duration: 106 QT Interval:  519 QTC Calculation: 478 R Axis:   -3 Text Interpretation:  Sinus rhythm Low voltage, precordial leads Abnormal R-wave progression, early transition Minimal ST depression,  anterolateral leads Borderline prolonged QT interval Baseline wander When compared with ECG of 10/30/2015 QT has lengthened Confirmed by [facility]  MD, Nunzio Cory [phone]) on 08/17/2016 4:00:10 PM       Radiology   Procedures Procedures (including critical care time)  Medications Ordered in ED Medications - No data to display   Initial Impression / Assessment and Plan / ED Course  I have reviewed  the triage vital signs and the nursing notes.  Pertinent labs & imaging results that were available during my care of the patient were reviewed by me and considered in my medical decision making (see chart for details).  MDM Reviewed: previous chart, nursing note and vitals Reviewed previous: labs and ECG Interpretation: labs, ECG, x-ray, CT scan and MRI   Results for orders placed or performed during the hospital encounter of 86/76/19  Basic metabolic panel  Result Value Ref Range   Sodium 137 135 - 145 mmol/L   Potassium 4.3 3.5 - 5.1 mmol/L   Chloride 103 101 - 111 mmol/L   CO2 29 22 - 32 mmol/L   Glucose, Bld 97 65 - 99 mg/dL   BUN 7 6 - 20 mg/dL   Creatinine, Ser 1.09 0.61 - 1.24 mg/dL   Calcium 9.2 8.9 - 10.3 mg/dL   GFR calc non Af Amer >60 >60 mL/min   GFR calc Af Amer >60 >60 mL/min   Anion gap 5 5 - 15  CBC  Result Value Ref Range   WBC 10.5 4.0 - 10.5 K/uL   RBC 4.78 4.22 - 5.81 MIL/uL   Hemoglobin 12.4 (L) 13.0 - 17.0 g/dL   HCT 38.1 (L) 39.0 - 52.0 %   MCV 79.7 78.0 - 100.0 fL   MCH 25.9 (L) 26.0 - 34.0 pg   MCHC 32.5 30.0 - 36.0 g/dL   RDW 15.7 (H) 11.5 - 15.5 %   Platelets 271 150 - 400 K/uL  Urinalysis, Routine w reflex microscopic  Result Value Ref Range   Color, Urine YELLOW YELLOW   APPearance CLEAR CLEAR   Specific Gravity, Urine 1.009 1.005 - 1.030   pH 7.0 5.0 - 8.0   Glucose, UA NEGATIVE NEGATIVE mg/dL   Hgb urine dipstick NEGATIVE NEGATIVE   Bilirubin Urine NEGATIVE NEGATIVE   Ketones, ur NEGATIVE NEGATIVE mg/dL   Protein, ur NEGATIVE NEGATIVE  mg/dL   Nitrite NEGATIVE NEGATIVE   Leukocytes, UA NEGATIVE NEGATIVE  Hepatic function panel  Result Value Ref Range   Total Protein 6.7 6.5 - 8.1 g/dL   Albumin 4.1 3.5 - 5.0 g/dL   AST 28 15 - 41 U/L   ALT 28 17 - 63 U/L   Alkaline Phosphatase 51 38 - 126 U/L   Total Bilirubin 0.6 0.3 - 1.2 mg/dL   Bilirubin, Direct 0.1 0.1 - 0.5 mg/dL   Indirect Bilirubin 0.5 0.3 - 0.9 mg/dL  Troponin I  Result Value Ref Range   Troponin I <0.03 <0.03 ng/mL  Lactic acid, plasma  Result Value Ref Range   Lactic Acid, Venous 1.4 0.5 - 1.9 mmol/L  Lactic acid, plasma  Result Value Ref Range   Lactic Acid, Venous 1.6 0.5 - 1.9 mmol/L  CBG monitoring, ED  Result Value Ref Range   Glucose-Capillary 127 (H) 65 - 99 mg/dL   Dg Chest 2 View Result Date: 08/17/2016 CLINICAL DATA:  Fatigue, chills, and generalized body aches upon arising today. History of coronary artery disease, community-acquired pneumonia, chronic renal insufficiency, former smoker. EXAM: CHEST  2 VIEW COMPARISON:  PA and lateral chest x-ray of October 30, 2015 FINDINGS: The lungs are adequately inflated and clear. The heart and pulmonary vascularity are normal. The mediastinum is normal in width. There is calcification in the wall of the aortic arch. There is no pleural effusion. The bony thorax exhibits no acute abnormality. IMPRESSION: There is no acute cardiopulmonary abnormality. Thoracic aortic atherosclerosis. Electronically Signed   By: [name]  [place] M.D.   On: 08/17/2016 16:41   Ct Head Wo Contrast Result Date: 08/17/2016 CLINICAL DATA:  Generalized weakness EXAM: CT HEAD WITHOUT CONTRAST TECHNIQUE: Contiguous axial images were obtained from the base of the skull through the vertex without intravenous contrast. COMPARISON:  12/03/2013 FINDINGS: Brain: No acute territorial infarction, hemorrhage or intracranial mass is seen. Moderate atrophy. Stable prominent ventricles likely related to atrophy. Vascular: No hyperdense vessels.   Carotid artery calcifications. Skull: Normal. Negative for fracture or focal lesion. Sinuses/Orbits: No acute finding.  Bilateral lens extraction Other: None IMPRESSION: No CT evidence for acute intracranial abnormality. Electronically Signed   By: Donavan Foil M.D.   On: 08/17/2016 16:48   Mr Brain Wo Contrast (neuro Protocol) Result Date: 08/17/2016 CLINICAL DATA:  Initial evaluation for acute confusion, generalized weakness. EXAM: MRI HEAD WITHOUT CONTRAST TECHNIQUE: Multiplanar, multiecho pulse sequences of the brain and surrounding structures were obtained without intravenous contrast. COMPARISON:  Prior CT from earlier the same day as well as previous MRI from 05/31/2013. FINDINGS: Brain: Mild diffuse prominence of the CSF containing spaces compatible with generalized cerebral atrophy. Patchy T2/FLAIR abnormality within the right subinsular white matter likely related small vessel disease. Minimal T2/FLAIR signal seen within the periventricular white matter as well, similar to previous. No evidence for acute or subacute infarct. Gray-white matter differentiation maintained. No encephalomalacia to suggest chronic infarction. No evidence for acute or chronic intracranial hemorrhage. No mass lesion, midline shift or mass effect. Prominent ventriculomegaly, mildly out of proportion to cortical sulcation, which can be seen in the setting of underlying NPH. This is similar to previous. No extra-axial fluid collection. Pituitary gland suprasellar region normal. Midline structures intact and normal. Vascular: Major intracranial vascular flow voids are maintained. Skull and upper cervical spine: Craniocervical junction within normal limits. Visualized upper cervical spine unremarkable. Bone marrow signal intensity normal. No scalp soft tissue abnormality. Sinuses/Orbits: Globes and orbital soft tissues within normal limits. Patient status post lens extraction bilaterally. Paranasal sinuses are clear. No mastoid  effusion. Inner ear structures normal. IMPRESSION: 1. No acute intracranial process identified. 2. Ventriculomegaly, somewhat out of proportion of cortical sulcation, which can be seen in the setting of underlying NPH. Overall appearance is stable from previous exam. 3. Minimal chronic microvascular ischemic changes, mildly progressed relative to prior. Electronically Signed   By: Jeannine Boga M.D.   On: 08/17/2016 20:50    2120:  Pt orthostatic on VS; IVF bolus given with improvement. Pt has been ambulating out of his exam room, becoming more confused as he has been in the ED, needed re-direction. MRI without acute stroke. Pt now does not remember that he has an IV in his arm, nor received IV fluid.  BP now dropping again; will repeat IVF bolus and admit. T/C to [facility]. [place] Comment, case discussed, including:  HPI, pertinent PM/SHx, VS/PE, dx testing, ED course and treatment:  Agreeable to admit.    Final Clinical Impressions(s) / ED Diagnoses   Final diagnoses:  None    New Prescriptions New Prescriptions   No medications on file     Francine Graven, DO 08/20/16 1505

## 2016-08-17 NOTE — H&P (Signed)
History and Physical    Corey [name] UMP:[id number] DOB: December 17, 1950 DOA: 08/17/2016  PCP: [place], [place]  Patient coming from: Home.   Chief Complaint:  Patient would like to know if he has an aneurysm.   HPI: Corey [name] is an 66 y.o. male with hx of severe anxiety, Bipolar illness with hx of psychosis, hx of THC abuse, CAD, depression, told me that he was anxious and with his hx of migraine, presented to the ER wanting to know if he has an aneurysm.  Work up in the ER included unremarkable serology including no leukocytosis, nornal renal Fx tests, and normal liver Fx tests.  His head CT without contrast showed no acute process.  As he was staying in the ER, RN and EDP noted that he was confused.  His confusion was mild, but he thought he was in [place],, and thought it was Tuesday rather than Monday.  An MRI of his brain was subsequently done, showing stable ventricular megaly.  Hospitalist was asked to admit him OBS for his confusion.  There was no fever, chills, HA, nausea, vomtiing, abdominal pain.  He told me he has severe anxiety, and that he doesn't drink, and no longer smoke THC.  He has seen a psychiatrist previously, but no longer seeing one.    ED Course:  See above.  Rewiew of Systems:  Constitutional: Negative for malaise, fever and chills. No significant weight loss or weight gain Eyes: Negative for eye pain, redness and discharge, diplopia, visual changes, or flashes of light. ENMT: Negative for ear pain, hoarseness, nasal congestion, sinus pressure and sore throat. No headaches; tinnitus, drooling, or problem swallowing. Cardiovascular: Negative for chest pain, palpitations, diaphoresis, dyspnea and peripheral edema. ; No orthopnea, PND Respiratory: Negative for cough, hemoptysis, wheezing and stridor. No pleuritic chestpain. Gastrointestinal: Negative for diarrhea, constipation,  melena, blood in stool, hematemesis, jaundice and rectal bleeding.      Genitourinary: Negative for frequency, dysuria, incontinence,flank pain and hematuria; Musculoskeletal: Negative for back pain and neck pain. Negative for swelling and trauma.;  Skin: . Negative for pruritus, rash, abrasions, bruising and skin lesion.; ulcerations Neuro: Negative for headache, lightheadedness and neck stiffness. Negative for weakness, altered level of consciousness , altered mental status, extremity weakness, burning feet, involuntary movement, seizure and syncope.  Psych: negative for insomnia, tearfulness, panic attacks, hallucinations, paranoia, suicidal or homicidal ideation   Past Medical History:  Diagnosis Date  . Anxiety   . Arthritis   . Bipolar disorder ([place])   . Coronary artery disease   . Depression   . GERD (gastroesophageal reflux disease)   . High cholesterol   . Iron deficiency anemia 12/09/2011  . Migraine   . Tinnitus     Past Surgical History:  Procedure Laterality Date  . BACK SURGERY    . CARDIAC CATHETERIZATION N/A 10/29/2015   Procedure: Left Heart Cath and Coronary Angiography;  Surgeon: Jettie Booze, MD;  Location: [facility];  Service: Cardiovascular;  Laterality: N/A;  . CARDIAC CATHETERIZATION N/A 10/29/2015   Procedure: Coronary Stent Intervention;  Surgeon: Jettie Booze, MD;  Location: [facility];  Service: Cardiovascular;  Laterality: N/A;  . CATARACT EXTRACTION W/PHACO Left 07/01/2015   Procedure: CATARACT EXTRACTION PHACO AND INTRAOCULAR LENS PLACEMENT (IOC);  Surgeon: Rutherford Guys, MD;  Location: [facility];  Service: Ophthalmology;  Laterality: Left;  CDE: 4.16  . ESOPHAGOGASTRODUODENOSCOPY  01/27/2012   Procedure: ESOPHAGOGASTRODUODENOSCOPY (EGD);  Surgeon: Rogene Houston, MD;  Location:  [facility];  Service: Endoscopy;  Laterality: N/A;  350-rescheduled to 9:15 Ann notitied pt  . HAND SURGERY Bilateral   . HERNIA REPAIR    . INSERTION OF MESH N/A 12/14/2013   Procedure: INSERTION OF MESH;  Surgeon: Jamesetta So, MD;  Location: [facility];  Service: General;  Laterality: N/A;  . MASS EXCISION Left 07/14/2012   Procedure: EXCISION SOFT TISSUE MASS LEG;  Surgeon: Jamesetta So, MD;  Location: [facility];  Service: General;  Laterality: Left;  . PERIPHERAL VASCULAR CATHETERIZATION  10/29/2015   Procedure: Thrombectomy;  Surgeon: Jettie Booze, MD;  Location: [facility];  Service: Cardiovascular;;  . ROTATOR CUFF REPAIR Bilateral   . UMBILICAL HERNIA REPAIR N/A 12/14/2013   Procedure: UMBILICAL HERNIORRHAPHY;  Surgeon: Jamesetta So, MD;  Location: [facility];  Service: General;  Laterality: N/A;     reports that he quit smoking about 9 months ago. His smoking use included Cigarettes. He started smoking about 47 years ago. He has never used smokeless tobacco. He reports that he does not drink alcohol or use drugs.  No Known Allergies  Family History  Problem Relation Age of Onset  . Heart attack Father   . CVA Father   . Cancer Brother      Prior to Admission medications   Medication Sig Start Date End Date Taking? Authorizing Provider  ALPRAZolam Duanne Moron) 1 MG tablet Take 1 tablet by mouth 3 (three) times daily as needed for anxiety.  06/04/15  Yes [provider]  ARIPiprazole (ABILIFY) 5 MG tablet Take 5 mg by mouth daily.   Yes [provider]  aspirin EC 81 MG tablet Take 1 tablet (81 mg total) by mouth daily. 04/13/13  Yes Niel Hummer, NP  atorvastatin (LIPITOR) 40 MG tablet TAKE 1 TABLET(40 MG) BY MOUTH DAILY AT 6 PM 04/28/16  Yes Arnoldo Lenis, MD  BRILINTA 90 MG TABS tablet TAKE 1 TABLET BY MOUTH TWICE DAILY 05/14/16  Yes Arnoldo Lenis, MD  diclofenac sodium (VOLTAREN) 1 % GEL Apply 2 g topically 4 (four) times daily.   Yes [provider]  fluticasone (FLONASE) 50 MCG/ACT nasal spray Place 2 sprays into both nostrils daily. 06/21/15  Yes [provider]  HYDROcodone-acetaminophen (NORCO/VICODIN) 5-325 MG tablet Take 1 tablet by mouth  every 4 (four) hours as needed for moderate pain.  05/20/15  Yes [provider]  metoprolol tartrate (LOPRESSOR) 25 MG tablet TAKE 1/2 TABLET(12.5 MG) BY MOUTH TWICE DAILY 08/09/16  Yes Branch, Alphonse Guild, MD  Multiple Vitamin (MULTIVITAMIN WITH MINERALS) TABS tablet Take 1 tablet by mouth daily. Centrum Silver 04/13/13  Yes Niel Hummer, NP  nitroGLYCERIN (NITROSTAT) 0.4 MG SL tablet Place 1 tablet (0.4 mg total) under the tongue every 5 (five) minutes as needed for chest pain. 10/31/15  Yes Reino Bellis B, NP  pantoprazole (PROTONIX) 40 MG tablet TAKE 1 TABLET BY MOUTH EVERY DAY 30 MINUTES BEFORE BREAKFAST 05/10/16  Yes Rehman, Mechele Dawley, MD  traMADol (ULTRAM) 50 MG tablet Take 50 mg by mouth every 6 (six) hours as needed for moderate pain.   Yes [provider]  traZODone (DESYREL) 50 MG tablet Take 50 mg by mouth at bedtime.   Yes [provider]    Physical Exam: Vitals:   08/17/16 1941 08/17/16 2000 08/17/16 2039 08/17/16 2100  BP: (!) 144/70 (!) 149/71 104/64 (!) 145/70  Pulse: (!) 49 (!) 49 (!) 50 (!) 49  Resp:  15 14 15 15   Temp:      TempSrc:      SpO2: 100% 100% 100% 100%  Weight:      Height:          Constitutional: NAD, calm, comfortable Vitals:   08/17/16 1941 08/17/16 2000 08/17/16 2039 08/17/16 2100  BP: (!) 144/70 (!) 149/71 104/64 (!) 145/70  Pulse: (!) 49 (!) 49 (!) 50 (!) 49  Resp: 15 14 15 15   Temp:      TempSrc:      SpO2: 100% 100% 100% 100%  Weight:      Height:       Eyes: PERRL, lids and conjunctivae normal ENMT: Mucous membranes are moist. Posterior pharynx clear of any exudate or lesions.Normal dentition.  Neck: normal, supple, no masses, no thyromegaly Respiratory: clear to auscultation bilaterally, no wheezing, no crackles. Normal respiratory effort. No accessory muscle use.  Cardiovascular: Regular rate and rhythm, no murmurs / rubs / gallops. No extremity edema. 2+ pedal pulses. No carotid bruits.  Abdomen: no  tenderness, no masses palpated. No hepatosplenomegaly. Bowel sounds positive.  Musculoskeletal: no clubbing / cyanosis. No joint deformity upper and lower extremities. Good ROM, no contractures. Normal muscle tone.  Skin: no rashes, lesions, ulcers. No induration Neurologic: CN 2-12 grossly intact. Sensation intact, DTR normal. Strength 5/5 in all 4.  Psychiatric: Normal judgment and insight. Alert and oriented x 3. Normal mood.    Labs on Admission: I have personally reviewed following labs and imaging studies  CBC:  Recent Labs Lab 08/17/16 1438  WBC 10.5  HGB 12.4*  HCT 38.1*  MCV 79.7  PLT 299   Basic Metabolic Panel:  Recent Labs Lab 08/17/16 1438  NA 137  K 4.3  CL 103  CO2 29  GLUCOSE 97  BUN 7  CREATININE 1.09  CALCIUM 9.2   GFR: Estimated Creatinine Clearance: 72.4 mL/min (by C-G formula based on SCr of 1.09 mg/dL). Liver Function Tests:  Recent Labs Lab 08/17/16 1602  AST 28  ALT 28  ALKPHOS 51  BILITOT 0.6  PROT 6.7  ALBUMIN 4.1   Cardiac Enzymes:  Recent Labs Lab 08/17/16 1602  TROPONINI <0.03   CBG:  Recent Labs Lab 08/17/16 1553  GLUCAP 127*   Urine analysis:    Component Value Date/Time   COLORURINE YELLOW 08/17/2016 1500   APPEARANCEUR CLEAR 08/17/2016 1500   LABSPEC 1.009 08/17/2016 1500   PHURINE 7.0 08/17/2016 1500   GLUCOSEU NEGATIVE 08/17/2016 1500   HGBUR NEGATIVE 08/17/2016 1500   BILIRUBINUR NEGATIVE 08/17/2016 1500   KETONESUR NEGATIVE 08/17/2016 1500   PROTEINUR NEGATIVE 08/17/2016 1500   UROBILINOGEN 0.2 12/03/2013 0122   NITRITE NEGATIVE 08/17/2016 1500   LEUKOCYTESUR NEGATIVE 08/17/2016 1500    Radiological Exams on Admission: Dg Chest 2 View  Result Date: 08/17/2016 CLINICAL DATA:  Fatigue, chills, and generalized body aches upon arising today. History of coronary artery disease, community-acquired pneumonia, chronic renal insufficiency, former smoker. EXAM: CHEST  2 VIEW COMPARISON:  PA and lateral chest  x-ray of October 30, 2015 FINDINGS: The lungs are adequately inflated and clear. The heart and pulmonary vascularity are normal. The mediastinum is normal in width. There is calcification in the wall of the aortic arch. There is no pleural effusion. The bony thorax exhibits no acute abnormality. IMPRESSION: There is no acute cardiopulmonary abnormality. Thoracic aortic atherosclerosis. Electronically Signed   By: David  Martinique M.D.   On: 08/17/2016 16:41   Ct Head Wo Contrast  Result Date:  08/17/2016 CLINICAL DATA:  Generalized weakness EXAM: CT HEAD WITHOUT CONTRAST TECHNIQUE: Contiguous axial images were obtained from the base of the skull through the vertex without intravenous contrast. COMPARISON:  12/03/2013 FINDINGS: Brain: No acute territorial infarction, hemorrhage or intracranial mass is seen. Moderate atrophy. Stable prominent ventricles likely related to atrophy. Vascular: No hyperdense vessels.  Carotid artery calcifications. Skull: Normal. Negative for fracture or focal lesion. Sinuses/Orbits: No acute finding.  Bilateral lens extraction Other: None IMPRESSION: No CT evidence for acute intracranial abnormality. Electronically Signed   By: Donavan Foil M.D.   On: 08/17/2016 16:48   Mr Brain Wo Contrast (neuro Protocol)  Result Date: 08/17/2016 CLINICAL DATA:  Initial evaluation for acute confusion, generalized weakness. EXAM: MRI HEAD WITHOUT CONTRAST TECHNIQUE: Multiplanar, multiecho pulse sequences of the brain and surrounding structures were obtained without intravenous contrast. COMPARISON:  Prior CT from earlier the same day as well as previous MRI from 05/31/2013. FINDINGS: Brain: Mild diffuse prominence of the CSF containing spaces compatible with generalized cerebral atrophy. Patchy T2/FLAIR abnormality within the right subinsular white matter likely related small vessel disease. Minimal T2/FLAIR signal seen within the periventricular white matter as well, similar to previous. No evidence  for acute or subacute infarct. Gray-white matter differentiation maintained. No encephalomalacia to suggest chronic infarction. No evidence for acute or chronic intracranial hemorrhage. No mass lesion, midline shift or mass effect. Prominent ventriculomegaly, mildly out of proportion to cortical sulcation, which can be seen in the setting of underlying NPH. This is similar to previous. No extra-axial fluid collection. Pituitary gland suprasellar region normal. Midline structures intact and normal. Vascular: Major intracranial vascular flow voids are maintained. Skull and upper cervical spine: Craniocervical junction within normal limits. Visualized upper cervical spine unremarkable. Bone marrow signal intensity normal. No scalp soft tissue abnormality. Sinuses/Orbits: Globes and orbital soft tissues within normal limits. Patient status post lens extraction bilaterally. Paranasal sinuses are clear. No mastoid effusion. Inner ear structures normal. IMPRESSION: 1. No acute intracranial process identified. 2. Ventriculomegaly, somewhat out of proportion of cortical sulcation, which can be seen in the setting of underlying NPH. Overall appearance is stable from previous exam. 3. Minimal chronic microvascular ischemic changes, mildly progressed relative to prior. Electronically Signed   By: Jeannine Boga M.D.   On: 08/17/2016 20:50    EKG: Independently reviewed.   Assessment/Plan Principal Problem:   Altered mental status Active Problems:   Hypercholesterolemia   CKD (chronic kidney disease), stage III   Bipolar affective disorder, current episode manic with psychotic symptoms ([place])   PLAN:   Confusion and altered mental status:  I suspect it is due to polypharmacy, along with drug use.  His UDS was positive for THC.  I think he also has psychiatric illness, with PMH of bipolar with psychosis, and to me, he is depressed with anxiety features.  He is not suicidal or homocidal.  Will admit him OBS,  and adjust his meds.  He was advised to not smoking marijuana, and should follow up with his PCP for Tx of his psychiatric illness.   CKD:  Stable.   Avoid neprhotoxic drug.  HLD:  Continue with his meds.    DVT prophylaxis: subQ heparin.  Code Status: FULL CODE>  Family Communication: None.  Disposition Plan: Home.  Consults called: None.  Admission status: OBS>    Mikelle Myrick MD FACP. [facility]  If 7PM-7AM, please contact night-coverage [email or web address] Password [facility]  08/17/2016, 9:41 PM

## 2016-08-17 NOTE — ED Notes (Signed)
ED Provider at bedside. 

## 2016-08-17 NOTE — ED Notes (Signed)
To MRI

## 2016-08-17 NOTE — ED Triage Notes (Signed)
Upon waking aching all over, and chills, denies pain

## 2016-08-17 NOTE — ED Notes (Signed)
Patient denies any unilateral weakness, slurred speech, trouble swallowing.  Denies history of CVA or TIA.  Denies facial droop.

## 2016-08-17 NOTE — ED Notes (Signed)
PAtient ambulated to restroom with steady gait. Redirected back to room.

## 2016-08-18 ENCOUNTER — Encounter (HOSPITAL_COMMUNITY): Payer: Self-pay | Admitting: *Deleted

## 2016-08-18 DIAGNOSIS — R4182 Altered mental status, unspecified: Secondary | ICD-10-CM

## 2016-08-18 DIAGNOSIS — N183 Chronic kidney disease, stage 3 (moderate): Secondary | ICD-10-CM | POA: Diagnosis not present

## 2016-08-18 DIAGNOSIS — E7800 Pure hypercholesterolemia, unspecified: Secondary | ICD-10-CM

## 2016-08-18 DIAGNOSIS — R402411 Glasgow coma scale score 13-15, in the field [EMT or ambulance]: Secondary | ICD-10-CM | POA: Diagnosis not present

## 2016-08-18 DIAGNOSIS — F312 Bipolar disorder, current episode manic severe with psychotic features: Secondary | ICD-10-CM | POA: Diagnosis not present

## 2016-08-18 LAB — TSH: TSH: 1.28 u[IU]/mL (ref 0.350–4.500)

## 2016-08-18 LAB — VITAMIN B12: Vitamin B-12: 520 pg/mL (ref 180–914)

## 2016-08-18 NOTE — Progress Notes (Signed)
Pt D/C'd home in stable condition w/out complaints.  All d/c instructions were reviewed with pt and all questions/concerns were addressed.  Pt is ambulating out of facility per his request with staff assist.

## 2016-08-18 NOTE — Care Management Obs Status (Signed)
[place] NOTIFICATION   Patient Details  Name: Corey [name] MRN: [id number] Date of Birth: May 01, 1950   Medicare Observation Status Notification Given:  Other (see comment) (discharged < 24 hrs)    Sherald Barge, RN 08/18/2016, 11:09 AM

## 2016-08-18 NOTE — Discharge Instructions (Signed)
Hypotension As your heart beats, it forces blood through your body. This force is called blood pressure. If you have hypotension, you have low blood pressure. When your blood pressure is too low, you may not get enough blood to your brain. You may feel weak, feel light-headed, have a fast heartbeat, or even pass out (faint). Follow these instructions at home: Eating and drinking  Drink enough fluids to keep your pee (urine) clear or pale yellow.  Eat a healthy diet, and follow instructions from your doctor about eating or drinking restrictions. A healthy diet includes: ? Fresh fruits and vegetables. ? Whole grains. ? Low-fat (lean) meats. ? Low-fat dairy products.  Eat extra salt only as told. Do not add extra salt to your diet unless your doctor tells you to.  Eat small meals often.  Avoid standing up quickly after you eat. Medicines  Take over-the-counter and prescription medicines only as told by your doctor. ? Follow instructions from your doctor about changing how much you take (the dosage) of your medicines, if this applies. ? Do not stop or change your medicine on your own. General instructions  Wear compression stockings as told by your doctor.  Get up slowly from lying down or sitting.  Avoid hot showers and a lot of heat as told by your doctor.  Return to your normal activities as told by your doctor. Ask what activities are safe for you.  Do not use any products that contain nicotine or tobacco, such as cigarettes and e-cigarettes. If you need help quitting, ask your doctor.  Keep all follow-up visits as told by your doctor. This is important. Contact a doctor if:  You throw up (vomit).  You have watery poop (diarrhea).  You have a fever for more than 2-3 days.  You feel more thirsty than normal.  You feel weak and tired. Get help right away if:  You have chest pain.  You have a fast or irregular heartbeat.  You lose feeling (get numbness) in any part  of your body.  You cannot move your arms or your legs.  You have trouble talking.  You get sweaty or feel light-headed.  You faint.  You have trouble breathing.  You have trouble staying awake.  You feel confused. This information is not intended to replace advice given to you by your health care provider. Make sure you discuss any questions you have with your health care provider. Document Released: 04/07/2009 Document Revised: 09/30/2015 Document Reviewed: 09/30/2015 Elsevier Interactive Patient Education  2017 [employer]   

## 2016-08-18 NOTE — Discharge Summary (Signed)
Physician Discharge Summary  CAN LUCCI VQM:[id number] DOB: 07/19/1950 DOA: 08/17/2016  PCP: [facility]  Admit date: 08/17/2016 Discharge date: 08/18/2016  Time spent: 45 minutes  Recommendations for Outpatient Follow-up:  -Will be discharged home today. -Advised to follow-up with primary care provider in 2 weeks.   Discharge Diagnoses:  Principal Problem:   Altered mental status Active Problems:   Hypercholesterolemia   CKD (chronic kidney disease), stage III   Bipolar affective disorder, current episode manic with psychotic symptoms [facility])   Discharge Condition: Stable and improved  Filed Weights   08/17/16 1258 08/17/16 2257  Weight: 74.8 kg (165 lb) 74.4 kg (164 lb)    History of present illness:  As per Dr. Marin Comment on 7/24: Corey [name] is an 66 y.o. male with hx of severe anxiety, Bipolar illness with hx of psychosis, hx of THC abuse, CAD, depression, told me that he was anxious and with his hx of migraine, presented to the ER wanting to know if he has an aneurysm.  Work up in the ER included unremarkable serology including no leukocytosis, nornal renal Fx tests, and normal liver Fx tests.  His head CT without contrast showed no acute process.  As he was staying in the ER, RN and EDP noted that he was confused.  His confusion was mild, but he thought he was in [place],, and thought it was Tuesday rather than Monday.  An MRI of his brain was subsequently done, showing stable ventricular megaly.  Hospitalist was asked to admit him OBS for his confusion.  There was no fever, chills, HA, nausea, vomtiing, abdominal pain.  He told me he has severe anxiety, and that he doesn't drink, and no longer smoke THC.  He has seen a psychiatrist previously, but no longer seeing one.     Hospital Course:   Acute encephalopathy -Resolved, back to baseline. -Etiology unclear given negative serology, normal CT head. -His UDS was positive for THC and this may be playing  a role in addition to his psychiatric illness. -Polypharmacy may also be playing a role, advised to discontinue use of hydrocodone.  Rest of chronic conditions have been stable.  Procedures:  None   Consultations:  None  Discharge Instructions  Discharge Instructions    Diet - low sodium heart healthy    Complete by:  As directed    Increase activity slowly    Complete by:  As directed      Allergies as of 08/18/2016   No Known Allergies     Medication List    STOP taking these medications   diclofenac sodium 1 % Gel Commonly known as:  VOLTAREN   HYDROcodone-acetaminophen 5-325 MG tablet Commonly known as:  NORCO/VICODIN     TAKE these medications   ALPRAZolam 1 MG tablet Commonly known as:  XANAX Take 1 tablet by mouth 3 (three) times daily as needed for anxiety.   ARIPiprazole 5 MG tablet Commonly known as:  ABILIFY Take 5 mg by mouth daily.   aspirin EC 81 MG tablet Take 1 tablet (81 mg total) by mouth daily.   atorvastatin 40 MG tablet Commonly known as:  LIPITOR TAKE 1 TABLET(40 MG) BY MOUTH DAILY AT 6 PM   BRILINTA 90 MG Tabs tablet Generic drug:  ticagrelor TAKE 1 TABLET BY MOUTH TWICE DAILY   fluticasone 50 MCG/ACT nasal spray Commonly known as:  FLONASE Place 2 sprays into both nostrils daily.   metoprolol tartrate 25 MG tablet Commonly  known as:  LOPRESSOR TAKE 1/2 TABLET(12.5 MG) BY MOUTH TWICE DAILY   multivitamin with minerals Tabs tablet Take 1 tablet by mouth daily. Centrum Silver   nitroGLYCERIN 0.4 MG SL tablet Commonly known as:  NITROSTAT Place 1 tablet (0.4 mg total) under the tongue every 5 (five) minutes as needed for chest pain.   pantoprazole 40 MG tablet Commonly known as:  PROTONIX TAKE 1 TABLET BY MOUTH EVERY DAY 30 MINUTES BEFORE BREAKFAST   traMADol 50 MG tablet Commonly known as:  ULTRAM Take 50 mg by mouth every 6 (six) hours as needed for moderate pain.   traZODone 50 MG tablet Commonly known as:   DESYREL Take 50 mg by mouth at bedtime.      No Known Allergies Follow-up Information    [facility] [employer]. Schedule an appointment as soon as possible for a visit in 2 week(s).   Specialty:  Family Medicine Contact information: [address] [place] [address] [phone]            The results of significant diagnostics from this hospitalization (including imaging, microbiology, ancillary and laboratory) are listed below for reference.    Significant Diagnostic Studies: Dg Chest 2 View  Result Date: 08/17/2016 CLINICAL DATA:  Fatigue, chills, and generalized body aches upon arising today. History of coronary artery disease, community-acquired pneumonia, chronic renal insufficiency, former smoker. EXAM: CHEST  2 VIEW COMPARISON:  PA and lateral chest x-ray of October 30, 2015 FINDINGS: The lungs are adequately inflated and clear. The heart and pulmonary vascularity are normal. The mediastinum is normal in width. There is calcification in the wall of the aortic arch. There is no pleural effusion. The bony thorax exhibits no acute abnormality. IMPRESSION: There is no acute cardiopulmonary abnormality. Thoracic aortic atherosclerosis. Electronically Signed   By: David  Martinique M.D.   On: 08/17/2016 16:41   Ct Head Wo Contrast  Result Date: 08/17/2016 CLINICAL DATA:  Generalized weakness EXAM: CT HEAD WITHOUT CONTRAST TECHNIQUE: Contiguous axial images were obtained from the base of the skull through the vertex without intravenous contrast. COMPARISON:  12/03/2013 FINDINGS: Brain: No acute territorial infarction, hemorrhage or intracranial mass is seen. Moderate atrophy. Stable prominent ventricles likely related to atrophy. Vascular: No hyperdense vessels.  Carotid artery calcifications. Skull: Normal. Negative for fracture or focal lesion. Sinuses/Orbits: No acute finding.  Bilateral lens extraction Other: None IMPRESSION: No CT evidence for acute intracranial  abnormality. Electronically Signed   By: Donavan Foil M.D.   On: 08/17/2016 16:48   Mr Brain Wo Contrast (neuro Protocol)  Result Date: 08/17/2016 CLINICAL DATA:  Initial evaluation for acute confusion, generalized weakness. EXAM: MRI HEAD WITHOUT CONTRAST TECHNIQUE: Multiplanar, multiecho pulse sequences of the brain and surrounding structures were obtained without intravenous contrast. COMPARISON:  Prior CT from earlier the same day as well as previous MRI from 05/31/2013. FINDINGS: Brain: Mild diffuse prominence of the CSF containing spaces compatible with generalized cerebral atrophy. Patchy T2/FLAIR abnormality within the right subinsular white matter likely related small vessel disease. Minimal T2/FLAIR signal seen within the periventricular white matter as well, similar to previous. No evidence for acute or subacute infarct. Gray-white matter differentiation maintained. No encephalomalacia to suggest chronic infarction. No evidence for acute or chronic intracranial hemorrhage. No mass lesion, midline shift or mass effect. Prominent ventriculomegaly, mildly out of proportion to cortical sulcation, which can be seen in the setting of underlying NPH. This is similar to previous. No extra-axial fluid collection. Pituitary gland suprasellar region normal.  Midline structures intact and normal. Vascular: Major intracranial vascular flow voids are maintained. Skull and upper cervical spine: Craniocervical junction within normal limits. Visualized upper cervical spine unremarkable. Bone marrow signal intensity normal. No scalp soft tissue abnormality. Sinuses/Orbits: Globes and orbital soft tissues within normal limits. Patient status post lens extraction bilaterally. Paranasal sinuses are clear. No mastoid effusion. Inner ear structures normal. IMPRESSION: 1. No acute intracranial process identified. 2. Ventriculomegaly, somewhat out of proportion of cortical sulcation, which can be seen in the setting of  underlying NPH. Overall appearance is stable from previous exam. 3. Minimal chronic microvascular ischemic changes, mildly progressed relative to prior. Electronically Signed   By: Jeannine Boga M.D.   On: 08/17/2016 20:50    Microbiology: No results found for this or any previous visit (from the past 240 hour(s)).   Labs: Basic Metabolic Panel:  Recent Labs Lab 08/17/16 1438  NA 137  K 4.3  CL 103  CO2 29  GLUCOSE 97  BUN 7  CREATININE 1.09  CALCIUM 9.2   Liver Function Tests:  Recent Labs Lab 08/17/16 1602  AST 28  ALT 28  ALKPHOS 51  BILITOT 0.6  PROT 6.7  ALBUMIN 4.1   No results for input(s): LIPASE, AMYLASE in the last 168 hours. No results for input(s): AMMONIA in the last 168 hours. CBC:  Recent Labs Lab 08/17/16 1438  WBC 10.5  HGB 12.4*  HCT 38.1*  MCV 79.7  PLT 271   Cardiac Enzymes:  Recent Labs Lab 08/17/16 1602  TROPONINI <0.03   BNP: BNP (last 3 results) No results for input(s): BNP in the last 8760 hours.  ProBNP (last 3 results) No results for input(s): PROBNP in the last 8760 hours.  CBG:  Recent Labs Lab 08/17/16 1553  GLUCAP 127*       Signed:  HERNANDEZ ACOSTA,ESTELA  [facility] Pager: [phone] 08/18/2016, 10:54 AM

## 2016-08-18 NOTE — Care Management Note (Signed)
Case Management Note  Patient Details  Name: Corey [name] MRN: [id number] Date of Birth: 07-14-1950  Subjective/Objective:                  Admitted with AMS. No CM consult placed, chart reviewed for DC needs. Pt from home, lives alone. He has children for support if needed. He drives himself to appointments. He has PCP, He has insurance with drug coverage.   Action/Plan: Discharging home today with self care. No CM needs identified prior to DC.   Expected Discharge Date:  08/18/16               Expected Discharge Plan:  Home/Self Care  In-House Referral:  NA  Discharge planning Services  CM Consult  Post Acute Care Choice:  NA Choice offered to:  NA  Status of Service:  Completed, signed off  Sherald Barge, RN 08/18/2016, 11:07 AM

## 2016-08-19 LAB — RPR: RPR: NONREACTIVE

## 2016-08-19 LAB — HIV ANTIBODY (ROUTINE TESTING W REFLEX): HIV SCREEN 4TH GENERATION: NONREACTIVE

## 2016-08-27 DIAGNOSIS — G894 Chronic pain syndrome: Secondary | ICD-10-CM | POA: Diagnosis not present

## 2016-08-27 DIAGNOSIS — M1711 Unilateral primary osteoarthritis, right knee: Secondary | ICD-10-CM | POA: Diagnosis not present

## 2016-08-27 DIAGNOSIS — Z6823 Body mass index (BMI) 23.0-23.9, adult: Secondary | ICD-10-CM | POA: Diagnosis not present

## 2016-10-05 DIAGNOSIS — M1991 Primary osteoarthritis, unspecified site: Secondary | ICD-10-CM | POA: Diagnosis not present

## 2016-10-05 DIAGNOSIS — Z6821 Body mass index (BMI) 21.0-21.9, adult: Secondary | ICD-10-CM | POA: Diagnosis not present

## 2016-10-05 DIAGNOSIS — I2510 Atherosclerotic heart disease of native coronary artery without angina pectoris: Secondary | ICD-10-CM | POA: Diagnosis not present

## 2016-10-07 ENCOUNTER — Encounter: Payer: Self-pay | Admitting: *Deleted

## 2016-10-07 ENCOUNTER — Encounter: Payer: Self-pay | Admitting: Adult Health

## 2016-10-07 ENCOUNTER — Ambulatory Visit (INDEPENDENT_AMBULATORY_CARE_PROVIDER_SITE_OTHER): Payer: Medicare HMO | Admitting: Adult Health

## 2016-10-07 VITALS — BP 110/60 | HR 60 | Ht 71.0 in | Wt 156.0 lb

## 2016-10-07 DIAGNOSIS — E7800 Pure hypercholesterolemia, unspecified: Secondary | ICD-10-CM

## 2016-10-07 DIAGNOSIS — D508 Other iron deficiency anemias: Secondary | ICD-10-CM | POA: Diagnosis not present

## 2016-10-07 DIAGNOSIS — I2510 Atherosclerotic heart disease of native coronary artery without angina pectoris: Secondary | ICD-10-CM | POA: Diagnosis not present

## 2016-10-07 DIAGNOSIS — Z79899 Other long term (current) drug therapy: Secondary | ICD-10-CM

## 2016-10-07 NOTE — Progress Notes (Signed)
Cardiology Office Note   Date:  10/07/2016   ID:  HUEL CENTOLA, DOB 08-30-1950, MRN [id number]  PCP:  [facility]  Cardiologist:  [facility] Chief Complaint  Patient presents with  . Coronary Artery Disease  . Hyperlipidemia    History of Present Illness: Corey [name] is a 66 y.o. 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 On last office visit the patient was started on low-dose lisinopril 2.5 mg in the setting of CAD. He was counseled on smoking cessation, and advised to take lifelong clopidogrel therapy. He was last seen in the office on 02/05/2016.  Since being seen last, the patient has lost approximately 80 lbs, 30 lbs since April of 2018. He stopped smoking 4 months ago. He denies chest pain, DOE, but has complaints of fatigue. He is medically complaint. He is due to have dental extractions and to be fitted with dentures.    Past Medical History:  Diagnosis Date  . Anxiety   . Arthritis   . Bipolar disorder ([place])   . Coronary artery disease   . Depression   . GERD (gastroesophageal reflux disease)   . High cholesterol   . Iron deficiency anemia 12/09/2011  . Migraine   . Tinnitus     Past Surgical History:  Procedure Laterality Date  . BACK SURGERY    . CARDIAC CATHETERIZATION N/A 10/29/2015   Procedure: Left Heart Cath and Coronary Angiography;  Surgeon: Jettie Booze, MD;  Location: [facility];  Service: Cardiovascular;  Laterality: N/A;  . CARDIAC CATHETERIZATION N/A 10/29/2015   Procedure: Coronary Stent Intervention;  Surgeon: Jettie Booze, MD;  Location: [place] [facility];  Service: Cardiovascular;   Laterality: N/A;  . CATARACT EXTRACTION W/PHACO Left 07/01/2015   Procedure: CATARACT EXTRACTION PHACO AND INTRAOCULAR LENS PLACEMENT (IOC);  Surgeon: Rutherford Guys, MD;  Location: [facility];  Service: Ophthalmology;  Laterality: Left;  CDE: 4.16  . ESOPHAGOGASTRODUODENOSCOPY  01/27/2012   Procedure: ESOPHAGOGASTRODUODENOSCOPY (EGD);  Surgeon: Rogene Houston, MD;  Location: [facility];  Service: Endoscopy;  Laterality: N/A;  350-rescheduled to 9:15 Ann notitied pt  . HAND SURGERY Bilateral   . HERNIA REPAIR    . INSERTION OF MESH N/A 12/14/2013   Procedure: INSERTION OF MESH;  Surgeon: Jamesetta So, MD;  Location: [facility];  Service: General;  Laterality: N/A;  . MASS EXCISION Left 07/14/2012   Procedure: EXCISION SOFT TISSUE MASS LEG;  Surgeon: Jamesetta So, MD;  Location: [facility];  Service: General;  Laterality: Left;  . PERIPHERAL VASCULAR CATHETERIZATION  10/29/2015   Procedure: Thrombectomy;  Surgeon: Jettie Booze, MD;  Location: [facility];  Service: Cardiovascular;;  . ROTATOR CUFF REPAIR Bilateral   . UMBILICAL HERNIA REPAIR N/A 12/14/2013   Procedure: UMBILICAL HERNIORRHAPHY;  Surgeon: Jamesetta So, MD;  Location: [facility];  Service: General;  Laterality: N/A;     Current Outpatient Prescriptions  Medication Sig Dispense Refill  . ALPRAZolam (XANAX) 1 MG tablet Take 1 tablet by mouth 3 (three) times daily as needed for anxiety.   1  . ARIPiprazole (ABILIFY) 5 MG tablet Take 5 mg by mouth daily.    Marland Kitchen aspirin EC  81 MG tablet Take 1 tablet (81 mg total) by mouth daily.    Marland Kitchen atorvastatin (LIPITOR) 40 MG tablet TAKE 1 TABLET(40 MG) BY MOUTH DAILY AT 6 PM 90 tablet 3  . BRILINTA 90 MG TABS tablet TAKE 1 TABLET BY MOUTH TWICE DAILY 60 tablet 6  . fluticasone (FLONASE) 50 MCG/ACT nasal spray Place 2 sprays into both nostrils daily.    . metoprolol tartrate (LOPRESSOR) 25 MG tablet TAKE 1/2 TABLET(12.5 MG) BY MOUTH TWICE DAILY 60 tablet 6  . Multiple Vitamin (MULTIVITAMIN WITH  MINERALS) TABS tablet Take 1 tablet by mouth daily. Centrum Silver    . nitroGLYCERIN (NITROSTAT) 0.4 MG SL tablet Place 1 tablet (0.4 mg total) under the tongue every 5 (five) minutes as needed for chest pain. 25 tablet 3  . pantoprazole (PROTONIX) 40 MG tablet TAKE 1 TABLET BY MOUTH EVERY DAY 30 MINUTES BEFORE BREAKFAST 90 tablet 3  . traMADol (ULTRAM) 50 MG tablet Take 50 mg by mouth every 6 (six) hours as needed for moderate pain.    . traZODone (DESYREL) 50 MG tablet Take 50 mg by mouth at bedtime.     No current facility-administered medications for this visit.     Allergies:   Patient has no known allergies.    Social History:  The patient  reports that he quit smoking about a year ago. His smoking use included Cigarettes. He started smoking about 47 years ago. He has never used smokeless tobacco. He reports that he does not drink alcohol or use drugs.   Family History:  The patient's family history includes CVA in his father; Cancer in his brother; Heart attack in his father.    ROS: All other systems are reviewed and negative. Unless otherwise mentioned in H&P    PHYSICAL EXAM: VS:  BP 110/60 (BP Location: Left Arm)   Pulse 60   Ht 5\' 11"  (1.803 m)   Wt 156 lb (70.8 kg)   SpO2 98%   BMI 21.76 kg/m  , BMI Body mass index is 21.76 kg/m. GEN: Well nourished, well developed, in no acute distress  HEENT: normal  Neck: no JVD, carotid bruits, or masses Cardiac: RRR; no murmurs, rubs, or gallops,no edema  Respiratory:  clear to auscultation bilaterally, normal work of breathing GI: soft, nontender, nondistended, + BS MS: no deformity or atrophy  Skin: warm and dry, no rash Neuro:  Strength and sensation are intact Psych: euthymic mood, full affect   Recent Labs: 08/17/2016: ALT 28; BUN 7; Creatinine, Ser 1.09; Hemoglobin 12.4; Platelets 271; Potassium 4.3; Sodium 137; TSH 1.280    Lipid Panel    Component Value Date/Time   CHOL 170 02/04/2016 1017   TRIG 185 (H)  02/04/2016 1017   HDL 50 02/04/2016 1017   CHOLHDL 3.4 02/04/2016 1017   VLDL 37 02/04/2016 1017   LDLCALC 83 02/04/2016 1017      Wt Readings from Last 3 Encounters:  10/07/16 156 lb (70.8 kg)  08/17/16 164 lb (74.4 kg)  04/30/16 180 lb 9.6 oz (81.9 kg)      Other studies Reviewed:  Echocardiogram 10/31/2015 Left ventricle: Mild inferior to inferior lateral hypokinesis.   The cavity size was normal. Wall thickness was increased in a   pattern of moderate LVH. Systolic function was normal. The   estimated ejection fraction was in the range of 50% to 55%.   Doppler parameters are consistent with abnormal left ventricular   relaxation (grade 1 diastolic dysfunction). - Atrial septum: No  defect or patent foramen ovale was identified  ASSESSMENT AND PLAN:  1. CAD: Hx of DES to the RCA X 2 with residual 75% LAD lesion in the small part of the vessel, managed medically. He did not start the lisinopril as directed. He is asymptomatic currently. He is without cardiac complaints. I will check BMET for evaluation of kidney function. He will continue DAPT, but stop Brilinta 5 days prior to teeth extraction. Letter will be sent to his dentist.    2. Unintentional Weight loss: With history of smoking I am concerned about malignancy. He is asymptomatic concerning dyspnea or pain. I will check TSH, CEA. Further work up per PCP.   3. Hx of Tobacco abuse: Stopped smoking 4 months ago.   4. Hypercholesterolemia: Continue statin therapy. Checking fasting lipids and LFT's   Current medicines are reviewed at length with the patient today.    Labs/ tests ordered today include: BMET, CBC, CEA, Lipids and LFT;s, and TSH.   Phill Myron. West Pugh, ANP, AACC   10/07/2016 2:44 PM    [facility] [address], [place], [place] [address] Phone: [phone]; Fax: [phone]

## 2016-10-07 NOTE — Patient Instructions (Addendum)
Medication Instructions:  Your physician recommends that you continue on your current medications as directed. Please refer to the Current Medication list given to you today.   Labwork: Your physician recommends that you return for lab work in the Morning    Testing/Procedures: NONE   Follow-Up: Your physician wants you to follow-up in: 6 Months. You will receive a reminder letter in the mail two months in advance. If you don't receive a letter, please call our office to schedule the follow-up appointment.   Any Other Special Instructions Will Be Listed Below (If Applicable). We will send a letter to [place] that your are able to stop your Brilinta 5 days before your surgery.    If you need a refill on your cardiac medications before your next appointment, please call your pharmacy. Thank you for choosing [facility]!

## 2016-10-08 DIAGNOSIS — Z79899 Other long term (current) drug therapy: Secondary | ICD-10-CM | POA: Diagnosis not present

## 2016-10-08 DIAGNOSIS — Z87891 Personal history of nicotine dependence: Secondary | ICD-10-CM | POA: Diagnosis not present

## 2016-10-08 DIAGNOSIS — D508 Other iron deficiency anemias: Secondary | ICD-10-CM | POA: Diagnosis not present

## 2016-10-08 DIAGNOSIS — R634 Abnormal weight loss: Secondary | ICD-10-CM | POA: Diagnosis not present

## 2016-10-08 DIAGNOSIS — E7800 Pure hypercholesterolemia, unspecified: Secondary | ICD-10-CM | POA: Diagnosis not present

## 2016-10-09 LAB — BASIC METABOLIC PANEL
BUN/Creatinine Ratio: 5 (calc) — ABNORMAL LOW (ref 6–22)
BUN: 6 mg/dL — ABNORMAL LOW (ref 7–25)
CALCIUM: 9.3 mg/dL (ref 8.6–10.3)
CO2: 28 mmol/L (ref 20–32)
Chloride: 103 mmol/L (ref 98–110)
Creat: 1.14 mg/dL (ref 0.70–1.25)
GLUCOSE: 97 mg/dL (ref 65–99)
Potassium: 4.5 mmol/L (ref 3.5–5.3)
Sodium: 138 mmol/L (ref 135–146)

## 2016-10-09 LAB — CBC WITH DIFFERENTIAL/PLATELET
BASOS ABS: 106 {cells}/uL (ref 0–200)
BASOS PCT: 1.2 %
EOS PCT: 2.6 %
Eosinophils Absolute: 229 cells/uL (ref 15–500)
HCT: 40.2 % (ref 38.5–50.0)
HEMOGLOBIN: 13.1 g/dL — AB (ref 13.2–17.1)
Lymphs Abs: 3010 cells/uL (ref 850–3900)
MCH: 26.1 pg — ABNORMAL LOW (ref 27.0–33.0)
MCHC: 32.6 g/dL (ref 32.0–36.0)
MCV: 80.2 fL (ref 80.0–100.0)
MONOS PCT: 8.2 %
MPV: 10.9 fL (ref 7.5–12.5)
Neutro Abs: 4734 cells/uL (ref 1500–7800)
Neutrophils Relative %: 53.8 %
PLATELETS: 280 10*3/uL (ref 140–400)
RBC: 5.01 10*6/uL (ref 4.20–5.80)
RDW: 16 % — ABNORMAL HIGH (ref 11.0–15.0)
Total Lymphocyte: 34.2 %
WBC mixed population: 722 cells/uL (ref 200–950)
WBC: 8.8 10*3/uL (ref 3.8–10.8)

## 2016-10-09 LAB — HEPATIC FUNCTION PANEL
AG RATIO: 1.8 (calc) (ref 1.0–2.5)
ALBUMIN MSPROF: 3.9 g/dL (ref 3.6–5.1)
ALT: 20 U/L (ref 9–46)
AST: 17 U/L (ref 10–35)
Alkaline phosphatase (APISO): 61 U/L (ref 40–115)
Bilirubin, Direct: 0.1 mg/dL (ref 0.0–0.2)
Globulin: 2.2 g/dL (calc) (ref 1.9–3.7)
Indirect Bilirubin: 0.2 mg/dL (calc) (ref 0.2–1.2)
Total Bilirubin: 0.3 mg/dL (ref 0.2–1.2)
Total Protein: 6.1 g/dL (ref 6.1–8.1)

## 2016-10-09 LAB — CEA: CEA: 2.4 ng/mL

## 2016-10-09 LAB — TSH: TSH: 1.96 m[IU]/L (ref 0.40–4.50)

## 2016-10-09 LAB — LIPID PANEL
Cholesterol: 145 mg/dL (ref <200)
HDL: 39 mg/dL — ABNORMAL LOW (ref >40)
LDL CHOLESTEROL (CALC): 71 mg/dL
NON-HDL CHOLESTEROL (CALC): 106 mg/dL (ref <130)
TRIGLYCERIDES: 265 mg/dL — AB (ref <150)
Total CHOL/HDL Ratio: 3.7 (calc) (ref <5.0)

## 2016-10-09 LAB — MAGNESIUM: MAGNESIUM: 2 mg/dL (ref 1.5–2.5)

## 2016-10-11 DIAGNOSIS — G894 Chronic pain syndrome: Secondary | ICD-10-CM | POA: Diagnosis not present

## 2016-12-07 ENCOUNTER — Other Ambulatory Visit: Payer: Self-pay | Admitting: Cardiology

## 2016-12-11 ENCOUNTER — Encounter (HOSPITAL_COMMUNITY): Payer: Self-pay | Admitting: *Deleted

## 2016-12-11 ENCOUNTER — Other Ambulatory Visit: Payer: Self-pay

## 2016-12-11 ENCOUNTER — Emergency Department (HOSPITAL_COMMUNITY)
Admission: EM | Admit: 2016-12-11 | Discharge: 2016-12-11 | Disposition: A | Discharge status: Home / Self Care | Payer: Medicare Other | Attending: Emergency Medicine | Admitting: Emergency Medicine

## 2016-12-11 ENCOUNTER — Emergency Department (HOSPITAL_COMMUNITY): Payer: Medicare Other

## 2016-12-11 DIAGNOSIS — Z955 Presence of coronary angioplasty implant and graft: Secondary | ICD-10-CM | POA: Insufficient documentation

## 2016-12-11 DIAGNOSIS — D649 Anemia, unspecified: Secondary | ICD-10-CM | POA: Insufficient documentation

## 2016-12-11 DIAGNOSIS — I2510 Atherosclerotic heart disease of native coronary artery without angina pectoris: Secondary | ICD-10-CM | POA: Diagnosis not present

## 2016-12-11 DIAGNOSIS — Z87891 Personal history of nicotine dependence: Secondary | ICD-10-CM | POA: Diagnosis not present

## 2016-12-11 DIAGNOSIS — Z7982 Long term (current) use of aspirin: Secondary | ICD-10-CM | POA: Insufficient documentation

## 2016-12-11 DIAGNOSIS — Z79899 Other long term (current) drug therapy: Secondary | ICD-10-CM | POA: Diagnosis not present

## 2016-12-11 DIAGNOSIS — N183 Chronic kidney disease, stage 3 (moderate): Secondary | ICD-10-CM | POA: Insufficient documentation

## 2016-12-11 DIAGNOSIS — R410 Disorientation, unspecified: Secondary | ICD-10-CM | POA: Insufficient documentation

## 2016-12-11 DIAGNOSIS — R4182 Altered mental status, unspecified: Secondary | ICD-10-CM | POA: Diagnosis not present

## 2016-12-11 LAB — URINALYSIS, ROUTINE W REFLEX MICROSCOPIC
Bilirubin Urine: NEGATIVE
Glucose, UA: NEGATIVE mg/dL
Hgb urine dipstick: NEGATIVE
KETONES UR: NEGATIVE mg/dL
LEUKOCYTES UA: NEGATIVE
NITRITE: NEGATIVE
PH: 6 (ref 5.0–8.0)
Protein, ur: NEGATIVE mg/dL
SPECIFIC GRAVITY, URINE: 1.006 (ref 1.005–1.030)

## 2016-12-11 LAB — CBC
HEMATOCRIT: 34.1 % — AB (ref 39.0–52.0)
HEMOGLOBIN: 10.6 g/dL — AB (ref 13.0–17.0)
MCH: 25.4 pg — AB (ref 26.0–34.0)
MCHC: 31.1 g/dL (ref 30.0–36.0)
MCV: 81.6 fL (ref 78.0–100.0)
Platelets: 301 10*3/uL (ref 150–400)
RBC: 4.18 MIL/uL — ABNORMAL LOW (ref 4.22–5.81)
RDW: 14.8 % (ref 11.5–15.5)
WBC: 8.8 10*3/uL (ref 4.0–10.5)

## 2016-12-11 LAB — COMPREHENSIVE METABOLIC PANEL
ALBUMIN: 3.9 g/dL (ref 3.5–5.0)
ALK PHOS: 60 U/L (ref 38–126)
ALT: 26 U/L (ref 17–63)
ANION GAP: 6 (ref 5–15)
AST: 25 U/L (ref 15–41)
BILIRUBIN TOTAL: 0.6 mg/dL (ref 0.3–1.2)
BUN: 9 mg/dL (ref 6–20)
CALCIUM: 9.1 mg/dL (ref 8.9–10.3)
CO2: 30 mmol/L (ref 22–32)
Chloride: 104 mmol/L (ref 101–111)
Creatinine, Ser: 1.46 mg/dL — ABNORMAL HIGH (ref 0.61–1.24)
GFR calc non Af Amer: 49 mL/min — ABNORMAL LOW (ref >60)
GFR, EST AFRICAN AMERICAN: 56 mL/min — AB (ref >60)
Glucose, Bld: 85 mg/dL (ref 65–99)
POTASSIUM: 4.3 mmol/L (ref 3.5–5.1)
Sodium: 140 mmol/L (ref 135–145)
Total Protein: 6.7 g/dL (ref 6.5–8.1)

## 2016-12-11 LAB — RAPID URINE DRUG SCREEN, HOSP PERFORMED
AMPHETAMINES: NOT DETECTED
Barbiturates: NOT DETECTED
Benzodiazepines: POSITIVE — AB
COCAINE: NOT DETECTED
OPIATES: NOT DETECTED
TETRAHYDROCANNABINOL: NOT DETECTED

## 2016-12-11 LAB — ETHANOL

## 2016-12-11 NOTE — ED Triage Notes (Addendum)
Pt reports that earlier (6:30 pm) today the pt woke up looking for his mother who has been dead x 2 years. Pt does report that he is having lower back. Pt states when he stands up, he feels off balance. Pt know he's in the hospital, knows his birthday but is unable to tell this RN who the president is.

## 2016-12-11 NOTE — Discharge Instructions (Signed)
Your hemoglobin level was around 10 - it used to be at 13 - please let your doctor know about this and your kidney test  (Creatinine of 1.45 tonight).  These tests need to be rechecked in the next couple of weeks,  Drink plenty of fluids  ER for worsening symtpoms.

## 2016-12-11 NOTE — ED Provider Notes (Signed)
[facility] EMERGENCY DEPARTMENT Provider Note   CSN: [id number] Arrival date & time: 12/11/16  2126     History   Chief Complaint Chief Complaint  Patient presents with  . Altered Mental Status    HPI Corey [name] is a 66 y.o. male.  HPI  66 y/o male - has hx of Bipolar d/o as well as a history of migraine headaches, acid reflux, coronary disease status post coronary stenting which occurred approximately 1 year ago.  The patient was in his usual state of health until several hours ago when he called his sister, the sister stated that he was not making sense when he talks, talking about seeing his mother or feeling like his mother might be near like she was thinking about her but he does not endorse actually seeing her.  The sister reports that this has happened occasionally in the past, he sometimes will get in the car and drive and then call not knowing where he is and require a ride home.  There has been no headaches, chest pain, cough, shortness of breath, fevers chills nausea or vomiting, abdominal pain, back pain, rashes, swelling, blurred vision, numbness or weakness.  The sister reports that he seems to be at his baseline at this time.  He has had admissions to the behavioral health Hospital in the past and in fact was seen at [facility] as well.  He states that he no longer takes some of the medications he was taking last year because of the way that made him feel but has been taking his Wellbutrin and other medications that he brings with him to the hospital including [place].  He has not missed any medications, has no new medications, and at this time feels like he is back to his baseline.  Past Medical History:  Diagnosis Date  . Anxiety   . Arthritis   . Bipolar disorder ([place])   . Coronary artery disease   . Depression   . GERD (gastroesophageal reflux disease)   . High cholesterol   . Iron deficiency anemia 12/09/2011  . Migraine   . Tinnitus      Patient Active Problem List   Diagnosis Date Noted  . Altered mental status 08/17/2016  . Tobacco abuse 10/30/2015  . Syncope 10/30/2015  . Acute inferolateral myocardial infarction ([place]) 10/29/2015  . Acute MI, inferolateral wall, initial episode of care ([place])   . Bipolar affective disorder, current episode manic with psychotic symptoms ([place]) 04/06/2013  . 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 Barrett's esophagus 02/28/2013  . Sepsis ([place]) 01/25/2013  . CKD (chronic kidney disease), stage III ([place]) 01/25/2013  . CAP (community acquired pneumonia) 01/24/2013  . AKI (acute kidney injury) ([place]) 01/24/2013  . Acute encephalopathy 01/24/2013  . Community acquired bacterial pneumonia 01/24/2013  . SIRS (systemic inflammatory response syndrome) ([place]) 01/24/2013  . Iron deficiency anemia 12/09/2011  . Hypercholesterolemia 12/09/2011    Past Surgical History:  Procedure Laterality Date  . BACK SURGERY    . CATARACT EXTRACTION PHACO AND INTRAOCULAR LENS PLACEMENT (IOC) Left 07/01/2015   Performed by Rutherford Guys, MD at [facility]  . Coronary Stent Intervention N/A 10/29/2015   Performed by Jettie Booze, MD at [place] [facility]  . ESOPHAGOGASTRODUODENOSCOPY (EGD) N/A 01/27/2012   Performed by Rogene Houston, MD at [place]  . EXCISION SOFT TISSUE MASS LEG Left 07/14/2012   Performed by Jamesetta So, MD at [facility]  .  HAND SURGERY Bilateral   . HERNIA REPAIR    . INSERTION OF MESH N/A 12/14/2013   Performed by Jamesetta So, MD at [facility]  . Left Heart Cath and Coronary Angiography N/A 10/29/2015   Performed by Jettie Booze, MD at [place] [facility]  . ROTATOR CUFF REPAIR Bilateral   . Thrombectomy  10/29/2015   Performed by Jettie Booze, MD at [place] [facility]  . UMBILICAL HERNIORRHAPHY N/A 12/14/2013   Performed by Jamesetta So, MD at [facility]       Home Medications    Prior to Admission medications    Medication Sig Start Date End Date Taking? Authorizing Provider  ALPRAZolam Duanne Moron) 1 MG tablet Take 1 tablet by mouth 3 (three) times daily as needed for anxiety.  06/04/15   [provider]  ARIPiprazole (ABILIFY) 5 MG tablet Take 5 mg by mouth daily.    [provider]  aspirin EC 81 MG tablet Take 1 tablet (81 mg total) by mouth daily. 04/13/13   Niel Hummer, NP  atorvastatin (LIPITOR) 40 MG tablet TAKE 1 TABLET(40 MG) BY MOUTH DAILY AT 6 PM 04/28/16   Arnoldo Lenis, MD  BRILINTA 90 MG TABS tablet TAKE 1 TABLET BY MOUTH TWICE DAILY 12/07/16   Arnoldo Lenis, MD  fluticasone (FLONASE) 50 MCG/ACT nasal spray Place 2 sprays into both nostrils daily. 06/21/15   [provider]  metoprolol tartrate (LOPRESSOR) 25 MG tablet TAKE 1/2 TABLET(12.5 MG) BY MOUTH TWICE DAILY 08/09/16   Arnoldo Lenis, MD  Multiple Vitamin (MULTIVITAMIN WITH MINERALS) TABS tablet Take 1 tablet by mouth daily. Centrum Silver 04/13/13   Niel Hummer, NP  nitroGLYCERIN (NITROSTAT) 0.4 MG SL tablet Place 1 tablet (0.4 mg total) under the tongue every 5 (five) minutes as needed for chest pain. 10/31/15   Cheryln Manly, NP  pantoprazole (PROTONIX) 40 MG tablet TAKE 1 TABLET BY MOUTH EVERY DAY 30 MINUTES BEFORE BREAKFAST 05/10/16   Rehman, Mechele Dawley, MD  traMADol (ULTRAM) 50 MG tablet Take 50 mg by mouth every 6 (six) hours as needed for moderate pain.    [provider]  traZODone (DESYREL) 50 MG tablet Take 50 mg by mouth at bedtime.    [provider]    Family History Family History  Problem Relation Age of Onset  . Heart attack Father   . CVA Father   . Cancer Brother     Social History Social History   Tobacco Use  . Smoking status: Former Smoker    Types: Cigarettes    Start date: 02/04/1969    Last attempt to quit: 10/27/2015    Years since quitting: 1.1  . Smokeless tobacco: Never Used  . Tobacco comment: quit over 3 yrs ago  Substance Use Topics   . Alcohol use: No    Alcohol/week: 0.0 oz  . Drug use: No     Allergies   Patient has no known allergies.   Review of Systems Review of Systems  All other systems reviewed and are negative.    Physical Exam Updated Vital Signs BP 123/71   Pulse (!) 50   Temp 98.3 F (36.8 C) (Oral)   Resp 14   Ht 5\' 11"  (1.803 m)   Wt 74.8 kg (165 lb)   SpO2 99%   BMI 23.01 kg/m   Physical Exam  Constitutional: He appears well-developed and well-nourished. No distress.  HENT:  Head: Normocephalic and  atraumatic.  Mouth/Throat: Oropharynx is clear and moist. No oropharyngeal exudate.  Eyes: Conjunctivae and EOM are normal. Pupils are equal, round, and reactive to light. Right eye exhibits no discharge. Left eye exhibits no discharge. No scleral icterus.  Neck: Normal range of motion. Neck supple. No JVD present. No thyromegaly present.  Cardiovascular: Normal rate, regular rhythm, normal heart sounds and intact distal pulses. Exam reveals no gallop and no friction rub.  No murmur heard. Pulmonary/Chest: Effort normal and breath sounds normal. No respiratory distress. He has no wheezes. He has no rales.  Abdominal: Soft. Bowel sounds are normal. He exhibits no distension and no mass. There is no tenderness.  Genitourinary:  Genitourinary Comments: Rectal normal appearance, no masses - solid dark stool in vault - occult neg  Musculoskeletal: Normal range of motion. He exhibits no edema or tenderness.  Lymphadenopathy:    He has no cervical adenopathy.  Neurological: He is alert. Coordination normal.  Speech is clear, cranial nerves III through XII are intact, memory is intact, strength is normal in all 4 extremities including grips, sensation is intact to light touch and pinprick in all 4 extremities. Coordination as tested by finger-nose-finger is normal, no limb ataxia. Normal gait, normal reflexes at the patellar tendons bilaterally  Skin: Skin is warm and dry. No rash noted. No  erythema.  Psychiatric: He has a normal mood and affect. His behavior is normal.  Nursing note and vitals reviewed.    ED Treatments / Results  Labs (all labs ordered are listed, but only abnormal results are displayed) Labs Reviewed  CBC - Abnormal; Notable for the following components:      Result Value   RBC 4.18 (*)    Hemoglobin 10.6 (*)    HCT 34.1 (*)    MCH 25.4 (*)    All other components within normal limits  COMPREHENSIVE METABOLIC PANEL - Abnormal; Notable for the following components:   Creatinine, Ser 1.46 (*)    GFR calc non Af Amer 49 (*)    GFR calc Af Amer 56 (*)    All other components within normal limits  URINALYSIS, ROUTINE W REFLEX MICROSCOPIC  ETHANOL  RAPID URINE DRUG SCREEN, HOSP PERFORMED    EKG  EKG Interpretation  Date/Time:  Saturday December 11 2016 21:56:14 EST Ventricular Rate:  51 PR Interval:    QRS Duration: 104 QT Interval:  451 QTC Calculation: 416 R Axis:   -14 Text Interpretation:  Sinus rhythm Low voltage, precordial leads Left axis deviation since last tracing no significant change Confirmed by Noemi Chapel [phone]) on 12/11/2016 10:09:26 PM       Radiology Ct Head Wo Contrast  Result Date: 12/11/2016 CLINICAL DATA:  66 year old male with altered mental status. EXAM: CT HEAD WITHOUT CONTRAST TECHNIQUE: Contiguous axial images were obtained from the base of the skull through the vertex without intravenous contrast. COMPARISON:  Brain MRI dated 08/17/2016 FINDINGS: Brain: There is mild age-related atrophy chronic microvascular ischemic changes. There is dilatation of the ventricles out of proportion with the sulci which may represent central volume loss versus normal pressure hydrocephalus. Clinical correlation is recommended. There is no acute intracranial hemorrhage. No mass effect or midline shift. No extra-axial fluid collection. Vascular: No hyperdense vessel or unexpected calcification. Skull: Normal. Negative for fracture or  focal lesion. Sinuses/Orbits: No acute finding. Other: None IMPRESSION: 1. No acute intracranial hemorrhage. 2. Mild age-related atrophy and chronic microvascular ischemic changes. If symptoms persist, and there are no contraindications, MRI may provide better  evaluation if clinically indicated. Electronically Signed   By: Anner Crete M.D.   On: 12/11/2016 22:27    Procedures Procedures (including critical care time)  Medications Ordered in ED Medications - No data to display   Initial Impression / Assessment and Plan / ED Course  I have reviewed the triage vital signs and the nursing notes.  Pertinent labs & imaging results that were available during my care of the patient were reviewed by me and considered in my medical decision making (see chart for details).   It is not clear whether this is related to a neurologic event versus a psychiatric event versus some other cause however the patient does appear to be in no distress and has a normal exam.  He does have a slightly bizarre affect but does not appear to be manic, he is not responding to internal stimuli.  Interestingly his mother's birthday was 2 days ago, she died 79 or 6 years ago.  Hemoccult neg Hgb lower than a couple of months ago, Cr slightly higher - pt made aware of both findings. No decline in MS - no hallucination - appears stable for d/c Has some gradual cognitive decline over time Will f/u with Dr. Gerarda Fraction.  Final Clinical Impressions(s) / ED Diagnoses   Final diagnoses:  Anemia, unspecified type  Confusion    ED Discharge Orders    None       Noemi Chapel, MD 12/11/16 2334

## 2016-12-14 DIAGNOSIS — M1991 Primary osteoarthritis, unspecified site: Secondary | ICD-10-CM | POA: Diagnosis not present

## 2016-12-14 DIAGNOSIS — Z1389 Encounter for screening for other disorder: Secondary | ICD-10-CM | POA: Diagnosis not present

## 2016-12-14 DIAGNOSIS — Z6823 Body mass index (BMI) 23.0-23.9, adult: Secondary | ICD-10-CM | POA: Diagnosis not present

## 2016-12-14 DIAGNOSIS — F329 Major depressive disorder, single episode, unspecified: Secondary | ICD-10-CM | POA: Diagnosis not present

## 2017-01-10 ENCOUNTER — Telehealth: Payer: Self-pay | Admitting: Adult Health

## 2017-01-10 NOTE — Telephone Encounter (Signed)
I will forward to provider for dispo 

## 2017-01-10 NOTE — Telephone Encounter (Signed)
We may consider switching to Plavix. He will have to stop Brilinta, be reloaded with 300 mg of Plavix X 1 day (4 75 mg tablets) and then begin 75 mg daily. If he can afford the Plavix this is okay to begin.

## 2017-01-10 NOTE — Telephone Encounter (Signed)
Patient is taking Brilinta. States that is costing him $125 a month. Patient would like to know if there is anything cheaper he can take. / tg

## 2017-01-11 DIAGNOSIS — Z961 Presence of intraocular lens: Secondary | ICD-10-CM | POA: Diagnosis not present

## 2017-01-11 MED ORDER — CLOPIDOGREL BISULFATE 75 MG PO TABS: ORAL_TABLET | ORAL | 6 refills | Status: AC

## 2017-01-11 NOTE — Telephone Encounter (Signed)
Pt agrees to start plavix.He understands to load the first day with 300 mg, then 75 mg daily thereafter. He has stopped Brilinta

## 2017-02-11 DIAGNOSIS — M5136 Other intervertebral disc degeneration, lumbar region: Secondary | ICD-10-CM | POA: Diagnosis not present

## 2017-03-24 DIAGNOSIS — Z1389 Encounter for screening for other disorder: Secondary | ICD-10-CM | POA: Diagnosis not present

## 2017-03-24 DIAGNOSIS — Z6825 Body mass index (BMI) 25.0-25.9, adult: Secondary | ICD-10-CM | POA: Diagnosis not present

## 2017-03-24 DIAGNOSIS — M1991 Primary osteoarthritis, unspecified site: Secondary | ICD-10-CM | POA: Diagnosis not present

## 2017-03-24 DIAGNOSIS — E663 Overweight: Secondary | ICD-10-CM | POA: Diagnosis not present

## 2017-03-24 DIAGNOSIS — Z23 Encounter for immunization: Secondary | ICD-10-CM | POA: Diagnosis not present

## 2017-03-24 DIAGNOSIS — K219 Gastro-esophageal reflux disease without esophagitis: Secondary | ICD-10-CM | POA: Diagnosis not present

## 2017-03-24 DIAGNOSIS — M75101 Unspecified rotator cuff tear or rupture of right shoulder, not specified as traumatic: Secondary | ICD-10-CM | POA: Diagnosis not present

## 2017-04-06 ENCOUNTER — Other Ambulatory Visit: Payer: Self-pay

## 2017-04-06 MED ORDER — LISINOPRIL 2.5 MG PO TABS: 2.5000 mg | ORAL_TABLET | Freq: Every day | ORAL | 3 refills | Status: AC

## 2017-04-24 ENCOUNTER — Other Ambulatory Visit (INDEPENDENT_AMBULATORY_CARE_PROVIDER_SITE_OTHER): Payer: Self-pay | Admitting: Internal Medicine
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
## 2017-05-03 DIAGNOSIS — Z6827 Body mass index (BMI) 27.0-27.9, adult: Secondary | ICD-10-CM | POA: Diagnosis not present

## 2017-05-03 DIAGNOSIS — Z1389 Encounter for screening for other disorder: Secondary | ICD-10-CM | POA: Diagnosis not present

## 2017-05-03 DIAGNOSIS — E663 Overweight: Secondary | ICD-10-CM | POA: Diagnosis not present

## 2017-05-03 DIAGNOSIS — M1991 Primary osteoarthritis, unspecified site: Secondary | ICD-10-CM | POA: Diagnosis not present

## 2017-05-03 DIAGNOSIS — F329 Major depressive disorder, single episode, unspecified: Secondary | ICD-10-CM | POA: Diagnosis not present

## 2017-05-03 DIAGNOSIS — F312 Bipolar disorder, current episode manic severe with psychotic features: Secondary | ICD-10-CM | POA: Diagnosis not present

## 2017-05-03 DIAGNOSIS — M75101 Unspecified rotator cuff tear or rupture of right shoulder, not specified as traumatic: Secondary | ICD-10-CM | POA: Diagnosis not present

## 2017-05-05 ENCOUNTER — Encounter (INDEPENDENT_AMBULATORY_CARE_PROVIDER_SITE_OTHER): Payer: Self-pay | Admitting: *Deleted

## 2017-05-05 ENCOUNTER — Other Ambulatory Visit: Payer: Self-pay | Admitting: Cardiology

## 2017-05-05 ENCOUNTER — Encounter (INDEPENDENT_AMBULATORY_CARE_PROVIDER_SITE_OTHER): Payer: Self-pay | Admitting: Internal Medicine

## 2017-05-05 ENCOUNTER — Ambulatory Visit (INDEPENDENT_AMBULATORY_CARE_PROVIDER_SITE_OTHER): Payer: Medicare HMO | Admitting: Internal Medicine

## 2017-05-05 ENCOUNTER — Telehealth (INDEPENDENT_AMBULATORY_CARE_PROVIDER_SITE_OTHER): Payer: Self-pay | Admitting: *Deleted

## 2017-05-05 VITALS — BP 130/80 | HR 60 | Temp 97.7°F | Ht 71.0 in | Wt 187.5 lb

## 2017-05-05 DIAGNOSIS — K219 Gastro-esophageal reflux disease without esophagitis: Secondary | ICD-10-CM | POA: Diagnosis not present

## 2017-05-05 DIAGNOSIS — K5909 Other constipation: Secondary | ICD-10-CM

## 2017-05-05 DIAGNOSIS — K2270 Barrett's esophagus without dysplasia: Secondary | ICD-10-CM

## 2017-05-05 NOTE — Patient Instructions (Addendum)
EGD. The risks of bleeding, perforation and infection were reviewed with patient. Try a stool softener daily. Drink plenty of fluids.

## 2017-05-05 NOTE — Telephone Encounter (Signed)
Patient schedule for endoscopy 06/29/17 -- needs to stop plavix 5 days and ASA 2 days prior -- please advise if ok, thanks

## 2017-05-05 NOTE — Telephone Encounter (Signed)
Sent to Dr Harl Bowie for review.

## 2017-05-05 NOTE — Telephone Encounter (Signed)
Patient aware.

## 2017-05-05 NOTE — Telephone Encounter (Signed)
Ok to hold aspirin and plavix. He should be due to f/u with me in the next 2 months or so, Debbora Lacrosse please schedule    Zandra Abts MD

## 2017-05-05 NOTE — Progress Notes (Signed)
Subjective:    Patient ID: Corey [name], male    DOB: 12-10-1950, 67 y.o.   MRN: [id number]  HPI Here today for f/u. Last seen in April of 2018. Hx of chronic GERD complicated by Barrrett's esophagus. GERD controlled with Protnix.  Hx of cardiac stents and maintained on Plavix.   01/27/2012 EGD: Impression: 3 cm long segment of Barrett's esophagus proximal margin and 34 cm from the incisors.  No evidence of erosive esophagitis.  Small sliding hiatal hernia with GEJ at 37 cm.  Suspect iron deficiency anemia may be secondary to blood donation. Biopsy:  Notes Recorded by Rogene Houston, MD on 02/07/2012 at 2:58 PM Biopsy results reviewed with patient. Barrett's esophagus without dysplasia.  Patient is average risk for CRC. Last colonoscopy was normal in December 2011. He tells me he has some trouble with constipation. He says this bothers him.  He has not tried anything. Occurs frequently. Has a BM daily. He does have to strain.   Review of Systems Past Medical History:  Diagnosis Date  . Anxiety   . Arthritis   . Bipolar disorder ([place])   . Coronary artery disease   . Depression   . GERD (gastroesophageal reflux disease)   . High cholesterol   . Iron deficiency anemia 12/09/2011  . Migraine   . Tinnitus     Past Surgical History:  Procedure Laterality Date  . BACK SURGERY    . CARDIAC CATHETERIZATION N/A 10/29/2015   Procedure: Left Heart Cath and Coronary Angiography;  Surgeon: Jettie Booze, MD;  Location: [place] [facility];  Service: Cardiovascular;  Laterality: N/A;  . CARDIAC CATHETERIZATION N/A 10/29/2015   Procedure: Coronary Stent Intervention;  Surgeon: Jettie Booze, MD;  Location: [place] [facility];  Service: Cardiovascular;  Laterality: N/A;  . CATARACT EXTRACTION W/PHACO Left 07/01/2015   Procedure: CATARACT EXTRACTION PHACO AND INTRAOCULAR LENS PLACEMENT (IOC);  Surgeon: Rutherford Guys, MD;  Location: [facility];  Service: Ophthalmology;  Laterality:  Left;  CDE: 4.16  . ESOPHAGOGASTRODUODENOSCOPY  01/27/2012   Procedure: ESOPHAGOGASTRODUODENOSCOPY (EGD);  Surgeon: Rogene Houston, MD;  Location: [facility];  Service: Endoscopy;  Laterality: N/A;  350-rescheduled to 9:15 Ann notitied pt  . HAND SURGERY Bilateral   . HERNIA REPAIR    . INSERTION OF MESH N/A 12/14/2013   Procedure: INSERTION OF MESH;  Surgeon: Jamesetta So, MD;  Location: [facility];  Service: General;  Laterality: N/A;  . MASS EXCISION Left 07/14/2012   Procedure: EXCISION SOFT TISSUE MASS LEG;  Surgeon: Jamesetta So, MD;  Location: [facility];  Service: General;  Laterality: Left;  . PERIPHERAL VASCULAR CATHETERIZATION  10/29/2015   Procedure: Thrombectomy;  Surgeon: Jettie Booze, MD;  Location: [place] [facility];  Service: Cardiovascular;;  . ROTATOR CUFF REPAIR Bilateral   . UMBILICAL HERNIA REPAIR N/A 12/14/2013   Procedure: UMBILICAL HERNIORRHAPHY;  Surgeon: Jamesetta So, MD;  Location: [facility];  Service: General;  Laterality: N/A;    No Known Allergies  Current Outpatient Medications on File Prior to Visit  Medication Sig Dispense Refill  . ALPRAZolam (XANAX) 1 MG tablet Take 1 tablet by mouth 3 (three) times daily as needed for anxiety.   1  . ARIPiprazole (ABILIFY) 5 MG tablet Take 5 mg by mouth daily.    Marland Kitchen aspirin EC 81 MG tablet Take 1 tablet (81 mg total) by mouth daily.    . clopidogrel (PLAVIX) 75 MG tablet On the first day,  take 4 pills (300 mg ), then 75 mg daily thereafter 34 tablet 6  . fluticasone (FLONASE) 50 MCG/ACT nasal spray Place 2 sprays into both nostrils daily.    Marland Kitchen lisinopril (PRINIVIL,ZESTRIL) 2.5 MG tablet Take 1 tablet (2.5 mg total) by mouth daily. 90 tablet 3  . metoprolol tartrate (LOPRESSOR) 25 MG tablet TAKE 1/2 TABLET(12.5 MG) BY MOUTH TWICE DAILY 60 tablet 6  . Multiple Vitamin (MULTIVITAMIN WITH MINERALS) TABS tablet Take 1 tablet by mouth daily. Centrum Silver    . nitroGLYCERIN (NITROSTAT) 0.4 MG SL tablet Place 1 tablet (0.4  mg total) under the tongue every 5 (five) minutes as needed for chest pain. 25 tablet 3  . pantoprazole (PROTONIX) 40 MG tablet TAKE 1 TABLET BY MOUTH EVERY DAY 30 MINUTES BEFORE BREAKFAST 90 tablet 3  . traMADol (ULTRAM) 50 MG tablet Take 50 mg by mouth every 6 (six) hours as needed for moderate pain.     No current facility-administered medications on file prior to visit.         Objective:   Physical Exam Blood pressure 130/80, pulse 60, temperature 97.7 F (36.5 C), height 5\' 11"  (1.803 m), weight 187 lb 8 oz (85 kg).  Alert and oriented. Skin warm and dry. Oral mucosa is moist.   . Sclera anicteric, conjunctivae is pink. Thyroid not enlarged. No cervical lymphadenopathy. Lungs clear. Heart regular rate and rhythm.  Abdomen is soft. Bowel sounds are positive. No hepatomegaly. No abdominal masses felt. No tenderness.  No edema to lower extremities.         Assessment & Plan:  GERD continue the Protonix. Barrett's esophagus. Needs surveillance EGD. The risks of bleeding, perforation and infection were reviewed with patient.

## 2017-05-13 ENCOUNTER — Other Ambulatory Visit: Payer: Self-pay

## 2017-05-13 ENCOUNTER — Ambulatory Visit: Payer: PRIVATE HEALTH INSURANCE | Admitting: Family Medicine

## 2017-05-13 ENCOUNTER — Emergency Department (HOSPITAL_COMMUNITY): Payer: Medicare HMO

## 2017-05-13 ENCOUNTER — Encounter (HOSPITAL_COMMUNITY): Payer: Self-pay | Admitting: Emergency Medicine

## 2017-05-13 ENCOUNTER — Ambulatory Visit: Payer: PRIVATE HEALTH INSURANCE | Admitting: Urgent Care

## 2017-05-13 ENCOUNTER — Emergency Department (HOSPITAL_COMMUNITY)
Admission: EM | Admit: 2017-05-13 | Discharge: 2017-05-15 | Disposition: A | Discharge status: Home / Self Care | Payer: Medicare HMO | Attending: Emergency Medicine | Admitting: Emergency Medicine

## 2017-05-13 DIAGNOSIS — N183 Chronic kidney disease, stage 3 (moderate): Secondary | ICD-10-CM | POA: Diagnosis not present

## 2017-05-13 DIAGNOSIS — R4020 Unspecified coma: Secondary | ICD-10-CM | POA: Diagnosis not present

## 2017-05-13 DIAGNOSIS — Z7902 Long term (current) use of antithrombotics/antiplatelets: Secondary | ICD-10-CM | POA: Diagnosis not present

## 2017-05-13 DIAGNOSIS — F319 Bipolar disorder, unspecified: Secondary | ICD-10-CM | POA: Diagnosis not present

## 2017-05-13 DIAGNOSIS — R442 Other hallucinations: Secondary | ICD-10-CM | POA: Insufficient documentation

## 2017-05-13 DIAGNOSIS — I2510 Atherosclerotic heart disease of native coronary artery without angina pectoris: Secondary | ICD-10-CM | POA: Diagnosis not present

## 2017-05-13 DIAGNOSIS — G43919 Migraine, unspecified, intractable, without status migrainosus: Secondary | ICD-10-CM | POA: Diagnosis not present

## 2017-05-13 DIAGNOSIS — Z79899 Other long term (current) drug therapy: Secondary | ICD-10-CM | POA: Diagnosis not present

## 2017-05-13 DIAGNOSIS — I252 Old myocardial infarction: Secondary | ICD-10-CM | POA: Diagnosis not present

## 2017-05-13 DIAGNOSIS — E7800 Pure hypercholesterolemia, unspecified: Secondary | ICD-10-CM | POA: Diagnosis not present

## 2017-05-13 DIAGNOSIS — R443 Hallucinations, unspecified: Secondary | ICD-10-CM

## 2017-05-13 DIAGNOSIS — R5383 Other fatigue: Secondary | ICD-10-CM | POA: Diagnosis not present

## 2017-05-13 DIAGNOSIS — G894 Chronic pain syndrome: Secondary | ICD-10-CM | POA: Diagnosis not present

## 2017-05-13 DIAGNOSIS — Z87891 Personal history of nicotine dependence: Secondary | ICD-10-CM | POA: Diagnosis not present

## 2017-05-13 DIAGNOSIS — R531 Weakness: Secondary | ICD-10-CM | POA: Diagnosis not present

## 2017-05-13 DIAGNOSIS — Z6827 Body mass index (BMI) 27.0-27.9, adult: Secondary | ICD-10-CM | POA: Diagnosis not present

## 2017-05-13 DIAGNOSIS — Z7982 Long term (current) use of aspirin: Secondary | ICD-10-CM | POA: Insufficient documentation

## 2017-05-13 DIAGNOSIS — K219 Gastro-esophageal reflux disease without esophagitis: Secondary | ICD-10-CM | POA: Diagnosis not present

## 2017-05-13 HISTORY — DX: Acute myocardial infarction, unspecified: I21.9

## 2017-05-13 LAB — CBC WITH DIFFERENTIAL/PLATELET
BASOS ABS: 0 10*3/uL (ref 0.0–0.1)
BASOS PCT: 0 %
EOS ABS: 0.1 10*3/uL (ref 0.0–0.7)
Eosinophils Relative: 1 %
HCT: 39.5 % (ref 39.0–52.0)
Hemoglobin: 12.1 g/dL — ABNORMAL LOW (ref 13.0–17.0)
Lymphocytes Relative: 24 %
Lymphs Abs: 2.6 10*3/uL (ref 0.7–4.0)
MCH: 22.2 pg — ABNORMAL LOW (ref 26.0–34.0)
MCHC: 30.6 g/dL (ref 30.0–36.0)
MCV: 72.5 fL — AB (ref 78.0–100.0)
Monocytes Absolute: 0.9 10*3/uL (ref 0.1–1.0)
Monocytes Relative: 8 %
NEUTROS ABS: 7.1 10*3/uL (ref 1.7–7.7)
NEUTROS PCT: 67 %
PLATELETS: 319 10*3/uL (ref 150–400)
RBC: 5.45 MIL/uL (ref 4.22–5.81)
RDW: 16.9 % — AB (ref 11.5–15.5)
WBC: 10.7 10*3/uL — ABNORMAL HIGH (ref 4.0–10.5)

## 2017-05-13 LAB — RAPID URINE DRUG SCREEN, HOSP PERFORMED
Amphetamines: NOT DETECTED
Barbiturates: NOT DETECTED
Benzodiazepines: POSITIVE — AB
COCAINE: NOT DETECTED
Opiates: NOT DETECTED
Tetrahydrocannabinol: NOT DETECTED

## 2017-05-13 LAB — COMPREHENSIVE METABOLIC PANEL
ALT: 25 U/L (ref 17–63)
ANION GAP: 12 (ref 5–15)
AST: 30 U/L (ref 15–41)
Albumin: 4.8 g/dL (ref 3.5–5.0)
Alkaline Phosphatase: 60 U/L (ref 38–126)
BUN: 11 mg/dL (ref 6–20)
CHLORIDE: 98 mmol/L — AB (ref 101–111)
CO2: 28 mmol/L (ref 22–32)
Calcium: 9.5 mg/dL (ref 8.9–10.3)
Creatinine, Ser: 1.38 mg/dL — ABNORMAL HIGH (ref 0.61–1.24)
GFR, EST NON AFRICAN AMERICAN: 52 mL/min — AB (ref >60)
Glucose, Bld: 101 mg/dL — ABNORMAL HIGH (ref 65–99)
POTASSIUM: 3.7 mmol/L (ref 3.5–5.1)
SODIUM: 138 mmol/L (ref 135–145)
Total Bilirubin: 0.6 mg/dL (ref 0.3–1.2)
Total Protein: 8.4 g/dL — ABNORMAL HIGH (ref 6.5–8.1)

## 2017-05-13 LAB — URINALYSIS, ROUTINE W REFLEX MICROSCOPIC
Bilirubin Urine: NEGATIVE
Glucose, UA: NEGATIVE mg/dL
Hgb urine dipstick: NEGATIVE
KETONES UR: NEGATIVE mg/dL
LEUKOCYTES UA: NEGATIVE
NITRITE: NEGATIVE
PH: 9 — AB (ref 5.0–8.0)
PROTEIN: NEGATIVE mg/dL
Specific Gravity, Urine: 1.014 (ref 1.005–1.030)

## 2017-05-13 MED ORDER — LISINOPRIL 5 MG PO TABS
2.5000 mg | ORAL_TABLET | Freq: Every day | ORAL | Status: DC
  Administered 2017-05-13 – 2017-05-15 (×3): 2.5 mg via ORAL
  Filled 2017-05-13 (×3): qty 1

## 2017-05-13 MED ORDER — CLOPIDOGREL BISULFATE 75 MG PO TABS
75.0000 mg | ORAL_TABLET | Freq: Every day | ORAL | Status: DC
  Administered 2017-05-13 – 2017-05-15 (×3): 75 mg via ORAL
  Filled 2017-05-13 (×3): qty 1

## 2017-05-13 MED ORDER — ATORVASTATIN CALCIUM 10 MG PO TABS
10.0000 mg | ORAL_TABLET | Freq: Every morning | ORAL | Status: DC
  Administered 2017-05-13 – 2017-05-15 (×3): 10 mg via ORAL
  Filled 2017-05-13 (×3): qty 1

## 2017-05-13 MED ORDER — ASPIRIN EC 81 MG PO TBEC
81.0000 mg | DELAYED_RELEASE_TABLET | Freq: Every day | ORAL | Status: DC
  Administered 2017-05-13 – 2017-05-15 (×3): 81 mg via ORAL
  Filled 2017-05-13 (×3): qty 1

## 2017-05-13 MED ORDER — NITROGLYCERIN 0.4 MG SL SUBL: 0.4000 mg | SUBLINGUAL_TABLET | SUBLINGUAL | Status: DC | PRN

## 2017-05-13 MED ORDER — ARIPIPRAZOLE 2 MG PO TABS
2.0000 mg | ORAL_TABLET | Freq: Every day | ORAL | Status: DC
  Administered 2017-05-13 – 2017-05-15 (×3): 2 mg via ORAL
  Filled 2017-05-13 (×6): qty 1

## 2017-05-13 MED ORDER — BUPROPION HCL ER (SMOKING DET) 150 MG PO TB12
150.0000 mg | ORAL_TABLET | Freq: Every day | ORAL | Status: DC
  Administered 2017-05-13 – 2017-05-15 (×3): 150 mg via ORAL
  Filled 2017-05-13 (×6): qty 1

## 2017-05-13 NOTE — ED Triage Notes (Signed)
PT was sent to ED for eval from [facility] due to worsening in short term memory loss and hallucinations. PT states he feels like he is in a dream and sees his dead mother in his living room and talks with her. Family reports that his hallucinations started since the past two months but memory loss over the past year.

## 2017-05-13 NOTE — ED Provider Notes (Signed)
[facility] EMERGENCY DEPARTMENT Provider Note   CSN: [id number] Arrival date & time: 05/13/17  0856     History   Chief Complaint Chief Complaint  Patient presents with  . Hallucinations    HPI Corey [name] is a 67 y.o. male.  Patient has been seeing people that are not there and hearing people that are not there.  He is also been more confused and he was scammed out of a lot of money.  He believes he is going to get a new car and $23 million  The history is provided by the patient.  Altered Mental Status   This is a new problem. The current episode started more than 2 days ago. The problem has not changed since onset.Associated symptoms include confusion. Pertinent negatives include no seizures and no hallucinations. Risk factors: unknown. His past medical history does not include seizures.    Past Medical History:  Diagnosis Date  . Anxiety   . Arthritis   . Bipolar disorder ([place])   . Coronary artery disease   . Depression   . GERD (gastroesophageal reflux disease)   . High cholesterol   . Iron deficiency anemia 12/09/2011  . Migraine   . Myocardial infarct ([place])   . Tinnitus     Patient Active Problem List   Diagnosis Date Noted  . Barrett's esophagus without dysplasia 05/05/2017  . Altered mental status 08/17/2016  . Tobacco abuse 10/30/2015  . Syncope 10/30/2015  . Acute inferolateral myocardial infarction ([place]) 10/29/2015  . Acute MI, inferolateral wall, initial episode of care ([place])   . Bipolar affective disorder, current episode manic with psychotic symptoms ([place]) 04/06/2013  . 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 Barrett's esophagus 02/28/2013  . Sepsis ([place]) 01/25/2013  . CKD (chronic kidney disease), stage III ([place]) 01/25/2013  . CAP (community acquired pneumonia) 01/24/2013  . AKI (acute kidney injury) ([place]) 01/24/2013  . Acute encephalopathy 01/24/2013  . Community acquired bacterial pneumonia 01/24/2013  .  SIRS (systemic inflammatory response syndrome) ([place]) 01/24/2013  . Iron deficiency anemia 12/09/2011  . Hypercholesterolemia 12/09/2011    Past Surgical History:  Procedure Laterality Date  . BACK SURGERY    . CARDIAC CATHETERIZATION N/A 10/29/2015   Procedure: Left Heart Cath and Coronary Angiography;  Surgeon: Jettie Booze, MD;  Location: [place] CV LAB;  Service: Cardiovascular;  Laterality: N/A;  . CARDIAC CATHETERIZATION N/A 10/29/2015   Procedure: Coronary Stent Intervention;  Surgeon: Jettie Booze, MD;  Location: [place] CV LAB;  Service: Cardiovascular;  Laterality: N/A;  . CATARACT EXTRACTION W/PHACO Left 07/01/2015   Procedure: CATARACT EXTRACTION PHACO AND INTRAOCULAR LENS PLACEMENT (IOC);  Surgeon: Rutherford Guys, MD;  Location: [facility] ORS;  Service: Ophthalmology;  Laterality: Left;  CDE: 4.16  . ESOPHAGOGASTRODUODENOSCOPY  01/27/2012   Procedure: ESOPHAGOGASTRODUODENOSCOPY (EGD);  Surgeon: Rogene Houston, MD;  Location: [facility] ENDO SUITE;  Service: Endoscopy;  Laterality: N/A;  350-rescheduled to 9:15 Ann notitied pt  . HAND SURGERY Bilateral   . HERNIA REPAIR    . INSERTION OF MESH N/A 12/14/2013   Procedure: INSERTION OF MESH;  Surgeon: Jamesetta So, MD;  Location: [facility] ORS;  Service: General;  Laterality: N/A;  . MASS EXCISION Left 07/14/2012   Procedure: EXCISION SOFT TISSUE MASS LEG;  Surgeon: Jamesetta So, MD;  Location: [facility] ORS;  Service: General;  Laterality: Left;  . PERIPHERAL VASCULAR CATHETERIZATION  10/29/2015   Procedure: Thrombectomy;  Surgeon: Jettie Booze, MD;  Location: [facility]  INVASIVE CV LAB;  Service: Cardiovascular;;  . ROTATOR CUFF REPAIR Bilateral   . UMBILICAL HERNIA REPAIR N/A 12/14/2013   Procedure: UMBILICAL HERNIORRHAPHY;  Surgeon: Jamesetta So, MD;  Location: [facility] ORS;  Service: General;  Laterality: N/A;        Home Medications    Prior to Admission medications   Medication Sig Start Date End Date Taking? Authorizing Provider    aspirin EC 81 MG tablet Take 1 tablet (81 mg total) by mouth daily. 04/13/13  Yes Niel Hummer, NP  atorvastatin (LIPITOR) 40 MG tablet TAKE 1 TABLET(40 MG) BY MOUTH DAILY AT 6 PM 05/05/17  Yes Branch, Alphonse Guild, MD  buPROPion (ZYBAN) 150 MG 12 hr tablet Take 150 mg by mouth daily.  05/05/17  Yes [provider]  clopidogrel (PLAVIX) 75 MG tablet On the first day, take 4 pills (300 mg ), then 75 mg daily thereafter 01/11/17  Yes Lendon Colonel, NP  lisinopril (PRINIVIL,ZESTRIL) 2.5 MG tablet Take 1 tablet (2.5 mg total) by mouth daily. 04/06/17  Yes Branch, Alphonse Guild, MD  nitroGLYCERIN (NITROSTAT) 0.4 MG SL tablet Place 1 tablet (0.4 mg total) under the tongue every 5 (five) minutes as needed for chest pain. 10/31/15  Yes Cheryln Manly, NP    Family History Family History  Problem Relation Age of Onset  . Heart attack Father   . CVA Father   . Cancer Brother     Social History Social History   Tobacco Use  . Smoking status: Former Smoker    Types: Cigarettes    Start date: 02/04/1969    Last attempt to quit: 10/27/2015    Years since quitting: 1.5  . Smokeless tobacco: Never Used  . Tobacco comment: quit over 3 yrs ago  Substance Use Topics  . Alcohol use: No    Alcohol/week: 0.0 oz  . Drug use: No     Allergies   Patient has no known allergies.   Review of Systems Review of Systems  Constitutional: Negative for appetite change and fatigue.  HENT: Negative for congestion, ear discharge and sinus pressure.   Eyes: Negative for discharge.  Respiratory: Negative for cough.   Cardiovascular: Negative for chest pain.  Gastrointestinal: Negative for abdominal pain and diarrhea.  Genitourinary: Negative for frequency and hematuria.  Musculoskeletal: Negative for back pain.  Skin: Negative for rash.  Neurological: Negative for seizures and headaches.  Psychiatric/Behavioral: Positive for confusion. Negative for hallucinations.     Physical Exam Updated  Vital Signs BP (!) 166/87 (BP Location: Left Arm)   Pulse (!) 59   Temp 98 F (36.7 C) (Oral)   Resp 18   Ht 5\' 11"  (1.803 m)   Wt 84.8 kg (187 lb)   SpO2 100%   BMI 26.08 kg/m   Physical Exam  Constitutional: He is oriented to person, place, and time. He appears well-developed.  HENT:  Head: Normocephalic.  Eyes: Conjunctivae and EOM are normal. No scleral icterus.  Neck: Neck supple. No thyromegaly present.  Cardiovascular: Normal rate and regular rhythm. Exam reveals no gallop and no friction rub.  No murmur heard. Pulmonary/Chest: No stridor. He has no wheezes. He has no rales. He exhibits no tenderness.  Abdominal: He exhibits no distension. There is no tenderness. There is no rebound.  Musculoskeletal: Normal range of motion. He exhibits no edema.  Lymphadenopathy:    He has no cervical adenopathy.  Neurological: He is oriented to person, place, and time. He exhibits normal muscle  tone. Coordination normal.  Visual and auditory hallucinations  Skin: No rash noted. No erythema.  Psychiatric: He has a normal mood and affect. His behavior is normal.     ED Treatments / Results  Labs (all labs ordered are listed, but only abnormal results are displayed) Labs Reviewed  URINALYSIS, ROUTINE W REFLEX MICROSCOPIC - Abnormal; Notable for the following components:      Result Value   APPearance HAZY (*)    pH 9.0 (*)    All other components within normal limits  CBC WITH DIFFERENTIAL/PLATELET - Abnormal; Notable for the following components:   WBC 10.7 (*)    Hemoglobin 12.1 (*)    MCV 72.5 (*)    MCH 22.2 (*)    RDW 16.9 (*)    All other components within normal limits  COMPREHENSIVE METABOLIC PANEL - Abnormal; Notable for the following components:   Chloride 98 (*)    Glucose, Bld 101 (*)    Creatinine, Ser 1.38 (*)    Total Protein 8.4 (*)    GFR calc non Af Amer 52 (*)    All other components within normal limits  RAPID URINE DRUG SCREEN, HOSP PERFORMED -  Abnormal; Notable for the following components:   Benzodiazepines POSITIVE (*)    All other components within normal limits    EKG None  Radiology Dg Chest 2 View  Result Date: 05/13/2017 CLINICAL DATA:  Weakness, memory loss EXAM: CHEST - 2 VIEW COMPARISON:  08/17/2016 FINDINGS: Heart and mediastinal contours are within normal limits. No focal opacities or effusions. No acute bony abnormality. IMPRESSION: No active cardiopulmonary disease. Electronically Signed   By: Rolm Baptise M.D.   On: 05/13/2017 10:54   Ct Head Wo Contrast  Result Date: 05/13/2017 CLINICAL DATA:  Altered level of consciousness EXAM: CT HEAD WITHOUT CONTRAST TECHNIQUE: Contiguous axial images were obtained from the base of the skull through the vertex without intravenous contrast. COMPARISON:  None. FINDINGS: Brain: No evidence of acute infarction, hemorrhage, extra-axial collection, ventriculomegaly, or mass effect. Generalized cerebral atrophy. Periventricular white matter low attenuation likely secondary to microangiopathy. Vascular: Cerebrovascular atherosclerotic calcifications are noted. Skull: Negative for fracture or focal lesion. Sinuses/Orbits: Visualized portions of the orbits are unremarkable. Visualized portions of the paranasal sinuses and mastoid air cells are unremarkable. Other: None. IMPRESSION: No acute intracranial pathology. Electronically Signed   By: Kathreen Devoid   On: 05/13/2017 10:49    Procedures Procedures (including critical care time)  Medications Ordered in ED Medications  ARIPiprazole (ABILIFY) tablet 2 mg (has no administration in time range)  aspirin EC tablet 81 mg (has no administration in time range)  atorvastatin (LIPITOR) tablet 10 mg (has no administration in time range)  buPROPion (ZYBAN) 12 hr tablet 150 mg (has no administration in time range)  clopidogrel (PLAVIX) tablet 75 mg (has no administration in time range)  lisinopril (PRINIVIL,ZESTRIL) tablet 2.5 mg (has no  administration in time range)  nitroGLYCERIN (NITROSTAT) SL tablet 0.4 mg (has no administration in time range)     Initial Impression / Assessment and Plan / ED Course  I have reviewed the triage vital signs and the nursing notes.  Pertinent labs & imaging results that were available during my care of the patient were reviewed by me and considered in my medical decision making (see chart for details).     Patient is medically cleared.  He was seen by behavioral health and they would like him to get inpatient treatment for his hallucination  Final  Clinical Impressions(s) / ED Diagnoses   Final diagnoses:  None    ED Discharge Orders    None       Milton Ferguson, MD 05/13/17 1350

## 2017-05-13 NOTE — BH Assessment (Addendum)
Tele Assessment Note   Patient Name: Corey [name] MRN: [id number] Referring Physician: Milton Ferguson, MD  Location of Patient:  Location of Provider: Echo is an 67 y.o. male who presented in the [facility] ED accompanied by his daughter in law who participated in assessment at pt's request. Pt reports seeing his deceased mother of 8 years and having conversation with her. Pt stated " we were just having small talk, regular conversation". Pt denies SI/HI. Pt reports having a history of Bipolar disorder and pt stated since his divorce he has been experiencing increased depression, sadness, poor concentration and memory. Pt daughter in law states that pt has been mixing up mornings and nights and he doesn't know who his son is sometimes and cannot remember the year or where he is. Daughter in law also stated that the pt was just seen on April 9th for increased sadness.  Pt daughter in law stated that since February the pt has been acting bizarre. She stated that on Wednesday pt told his son and her that he thought he saw his son as a young child and his mother in his home staying up really late. Daughter in law stated that she and son tried to explain to the pt that his mother is dead however she stated that the pt started calling his sisters to confirm that his mother was actually deceased. Pt daughter in law also stated that the pt has been borrowing on his 15 and sending money to another country to pay people who have told the pt that he won the lottery of 23 million dollars and that the pt believes they are coming today to give him the money.   Pt identifies his primary stressor as the death of his parents and his divorce in 2014. Pt reports he lives alone and his primary support is his son and daughter in law. Pt reports family history of alcohol abuse( father and brother). Pt denies any legal problems. 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 Pt daughter in law stated that he did smoke crack cocaine in the 1980's. Pt stated he is not receiving any mental health treatment at this time. 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 Pt daughter in law stated that he sleeps for approximately 18 hours a day. Writer asked pt how is his appetite was and pt responded" good" I eat a lot" , however pt daughter in law stated that the has not been eating. She stated he has lost 50 pounds within the last year. Pt denied any history of sexual, physical or emotional abuse. Pt reports being previously receiving inpatient hospitalization in 2014. Pt is alert, oriented X 3 with normal speech. Eye contact was fair. Pt's mood is depressed and affect was sad. Thought process was incoherent. Pt insight and judgement is poor. Pt was cooperative throughout assessment.    Diagnosis: F31. 5 Bipolar I disorder, Current or most recent episode depressed, With psychotic features   Past Medical History:  Past Medical History:  Diagnosis Date  . Anxiety   . Arthritis   . Bipolar disorder ([place])   . Coronary artery disease   . Depression   . GERD (gastroesophageal reflux disease)   . High cholesterol   . Iron deficiency anemia 12/09/2011  . Migraine   . Myocardial infarct ([place])   . Tinnitus     Past Surgical History:  Procedure Laterality Date  . BACK SURGERY    . CARDIAC CATHETERIZATION N/A 10/29/2015   Procedure: Left Heart Cath and Coronary Angiography;  Surgeon: Jettie Booze, MD;  Location: [place] CV LAB;  Service: Cardiovascular;  Laterality: N/A;  . CARDIAC CATHETERIZATION N/A 10/29/2015   Procedure: Coronary Stent Intervention;  Surgeon: Jettie Booze, MD;  Location: [place] CV LAB;  Service: Cardiovascular;  Laterality: N/A;  . CATARACT EXTRACTION W/PHACO Left 07/01/2015   Procedure: CATARACT EXTRACTION PHACO AND INTRAOCULAR LENS PLACEMENT (IOC);  Surgeon: Rutherford Guys, MD;  Location: [facility]  ORS;  Service: Ophthalmology;  Laterality: Left;  CDE: 4.16  . ESOPHAGOGASTRODUODENOSCOPY  01/27/2012   Procedure: ESOPHAGOGASTRODUODENOSCOPY (EGD);  Surgeon: Rogene Houston, MD;  Location: [facility] ENDO SU[facility];  Service: Endoscopy;  Laterality: N/A;  350-rescheduled to 9:15 Ann notitied pt  . HAND SURGERY Bilateral   . HERNIA REPAIR    . INSERTION OF MESH N/A 12/14/2013   Procedure: INSERTION OF MESH;  Surgeon: Jamesetta So, MD;  Location: [facility] ORS;  Service: General;  Laterality: N/A;  . MASS EXCISION Left 07/14/2012   Procedure: EXCISION SOFT TISSUE MASS LEG;  Surgeon: Jamesetta So, MD;  Location: [facility] ORS;  Service: General;  Laterality: Left;  . PERIPHERAL VASCULAR CATHETERIZATION  10/29/2015   Procedure: Thrombectomy;  Surgeon: Jettie Booze, MD;  Location: [place] CV LAB;  Service: Cardiovascular;;  . ROTATOR CUFF REPAIR Bilateral   . UMBILICAL HERNIA REPAIR N/A 12/14/2013   Procedure: UMBILICAL HERNIORRHAPHY;  Surgeon: Jamesetta So, MD;  Location: [facility] ORS;  Service: General;  Laterality: N/A;    Family History:  Family History  Problem Relation Age of Onset  . Heart attack Father   . CVA Father   . Cancer Brother     Social History:  reports that he quit smoking about 18 months ago. His smoking use included cigarettes. He started smoking about 48 years ago. He has never used smokeless tobacco. He reports that he does not drink alcohol or use drugs.  Additional Social History:  Alcohol / Drug Use Pain Medications: SEE MAR  Prescriptions: SEE MAR  History of alcohol / drug use?: Yes Longest period of sobriety (when/how long): Pt stated his is 23 years sober of alcohol Substance #1 Name of Substance 1: Alcohol  1 - Age of First Use: 67 years old 1 - Amount (size/oz): N/A 1 - Frequency: N/A  1 - Duration: Pt is 23 years sober 1 - Last Use / Amount: Per pt 23 years ago  CIWA: CIWA-Ar BP: (!) 166/87 Pulse Rate: (!) 59 COWS:    Allergies: No Known Allergies  Home  Medications:  (Not in a hospital admission)  OB/GYN Status:  No LMP for male patient.  General Assessment Data Location of Assessment: [facility] ED TTS Assessment: In system Is this a Tele or Face-to-Face Assessment?: Tele Assessment Is this an Initial Assessment or a Re-assessment for this encounter?: Initial Assessment Marital status: Divorced [place] name: N/A  Is patient pregnant?: No Pregnancy Status: No Living Arrangements: Alone Can pt return to current living arrangement?: Yes Admission Status: Voluntary Is patient capable of signing voluntary admission?: No Referral Source: Self/Family/Friend Insurance type: Per chart pt has [place] Living Arrangements: Alone Name of Psychiatrist: N/A (Pt states he last saw a psychiatrist in 2014. Dr. Casimiro Needle) Name of Therapist: N/A  Education Status Is patient currently in school?: No  Risk to self with the past 6 months  Suicidal Ideation: No Has patient been a risk to self within the past 6 months prior to admission? : No Suicidal Intent: No Has patient had any suicidal intent within the past 6 months prior to admission? : No Is patient at risk for suicide?: No Suicidal Plan?: No Has patient had any suicidal plan within the past 6 months prior to admission? : No Access to Means: No(Pt states his neighbors have his guns) Previous Attempts/Gestures: No How many times?: 0 Other Self Harm Risks: no Triggers for Past Attempts: None known Intentional Self Injurious Behavior: None Family Suicide History: No Recent stressful life event(s): (Death of parents, divorce ) Persecutory voices/beliefs?: (Pt states he had conversation with his deceased mother ) Depression: (Pt states he has been feeling depressed since 2014) Depression Symptoms: (Sadness, withdrawn) Substance abuse history and/or treatment for substance abuse?: (Pt states he has been sober of alcohol of 23 years )  Risk to Others within the past 6  months Homicidal Ideation: No Does patient have any lifetime risk of violence toward others beyond the six months prior to admission? : No Thoughts of Harm to Others: No Current Homicidal Intent: No Current Homicidal Plan: No Access to Homicidal Means: No(Pt states his neighbors have his guns. ) Identified Victim: no History of harm to others?: No Assessment of Violence: None Noted Violent Behavior Description: no Does patient have access to weapons?: (Pt states his neighbors has his guns. ) Criminal Charges Pending?: No Does patient have a court date: No Is patient on probation?: No  Psychosis Hallucinations: (Pt stated he sees his deceased mother) Delusions: (Pt stated that he had a conversation with deceased mother )  Mental Status Report Appearance/Hygiene: Unremarkable Eye Contact: Fair Motor Activity: Freedom of movement Speech: Logical/coherent Level of Consciousness: Alert Mood: Depressed, Anxious Affect: Sad Anxiety Level: Minimal Thought Processes: Coherent Judgement: Impaired Orientation: Person, Place, Time Obsessive Compulsive Thoughts/Behaviors: None  Cognitive Functioning Concentration: Decreased Memory: Recent Impaired Is patient IDD: No Is patient DD?: No Insight: Poor Impulse Control: Poor Appetite: Poor Have you had any weight changes? : Loss(daughter in law stated pt loss 50 pounds in the last year) Amount of the weight change? (lbs): 50 lbs Sleep: (Pt daughter in law states pt sleeps 18 hours a day) Total Hours of Sleep: (varies ) Vegetative Symptoms: None  ADLScreening [facility] Assessment Services) Patient's cognitive ability adequate to safely complete daily activities?: Yes Patient able to express need for assistance with ADLs?: Yes Independently performs ADLs?: Yes (appropriate for developmental age)  Prior Inpatient Therapy Prior Inpatient Therapy: (2014-inpatient )  Prior Outpatient Therapy Prior Outpatient Therapy: (Pt previuosly saw  Dr. Casimiro Needle in 2014)  ADL Screening (condition at time of admission) Patient's cognitive ability adequate to safely complete daily activities?: Yes Patient able to express need for assistance with ADLs?: Yes Independently performs ADLs?: Yes (appropriate for developmental age)       Abuse/Neglect Assessment (Assessment to be complete while patient is alone) Abuse/Neglect Assessment Can Be Completed: Yes Physical Abuse: Denies Verbal Abuse: Denies Sexual Abuse: Denies Exploitation of patient/patient's resources: Denies Self-Neglect: Denies     Regulatory affairs officer (For Healthcare) Does Patient Have a Medical Advance Directive?: No Would patient like information on creating a medical advance directive?: No - Patient declined      [place], [facility]  Therapeutic Triage Specialist  [phone]      Disposition: Gave clinical report to Earleen Newport, NP who said pt meets criteria for inpatient psychiatric treatment. TTS informed MD and RN  Christy of recommendation.  Disposition Initial Assessment Completed for this Encounter: Yes  This service was provided via telemedicine using a 2-way, interactive audio and video technology.     Lynder Parents 05/13/2017 12:40 PM

## 2017-05-13 NOTE — ED Notes (Signed)
Belongings placed in patient bags and locked in lockers.  Wallet given to security .

## 2017-05-14 MED ORDER — ZOLPIDEM TARTRATE 5 MG PO TABS
5.0000 mg | ORAL_TABLET | Freq: Once | ORAL | Status: AC
Start: 2017-05-14 — End: 2017-05-14
  Administered 2017-05-14: 5 mg via ORAL
  Filled 2017-05-14: qty 1

## 2017-05-14 MED ORDER — ACETAMINOPHEN 500 MG PO TABS
500.0000 mg | ORAL_TABLET | Freq: Once | ORAL | Status: AC
  Administered 2017-05-14: 500 mg via ORAL
  Filled 2017-05-14: qty 1

## 2017-05-14 NOTE — ED Notes (Signed)
Per [facility], [facility] would consider pt if neuro consult was cleared. Have called Neuro and cart is in room

## 2017-05-14 NOTE — ED Notes (Signed)
Patient has been resting and cooperative at this time. Patient asleep. No distress. Patient has equal rise and fall of chest.

## 2017-05-14 NOTE — ED Notes (Signed)
Pt taking a shower 

## 2017-05-14 NOTE — BHH Counselor (Signed)
Pt presented to the ED with delusion and hallucination.  He has a history of Bipolar Disorder.  Pt was reassessed.  He continues to endorse delusion, stating that he speaks to his deceased mother.  He also reported seeing her.  RN [facility] spoke with [facility].

## 2017-05-14 NOTE — ED Notes (Signed)
Pt could be accepted in [facility] on Monday but [facility] will consider taking him if he has a neuro consult. Will notify MD

## 2017-05-14 NOTE — ED Notes (Signed)
Have spoken with patient and family member in room. Pt states he wants me to call his niece and speak with her about his care. Called family member Abigail Butts and gave her an update on patient's condition.

## 2017-05-14 NOTE — Progress Notes (Addendum)
Referred pt for inpatient treatment to: [facility]- per [place], accepted if pt becomes IVC'd- states admitting provider requiring IVC due to pt's assessment indicating delusional thought. CSW explained pt not currently IVC'd- Melissa states inform Boykin Nearing if pt becomes IVC'd and admitting information will be provided [place] "we would need a neuro consult for dementia to be done before we could consider admitting him" Loura Halt, MSW, LCSW Clinical Social Work 05/14/2017 [phone]

## 2017-05-14 NOTE — ED Notes (Signed)
Have set up [employer] in room

## 2017-05-14 NOTE — ED Notes (Signed)
TTS in room.  

## 2017-05-14 NOTE — ED Notes (Signed)
Pt is calm and cooperative.

## 2017-05-14 NOTE — ED Notes (Signed)
[facility] may be accepting pt

## 2017-05-14 NOTE — Consult Note (Addendum)
   [facility] Tele[facility]urology Consult Services  Date of Service:  05/14/17  Impression:  Hallucinations and confusion - suspect due to underlying psychiatric disease. Dementia cannot be diagnosed in the midst of delirium or potential psychotic episode. Recommend MRI brain to rule out stroke or other lesion causing cognitive impairment. He will need outpatient neuro follow up for neuropsych testing.   Recommendations:  MRI brain wwo contrast - either inpatient or outpatient. Outpatient neuro follow up for neuropsych testing.  ---------------------------------------------------------------------  CC: hallucinations  History of Present Illness:  67 yo man with a hx of bipolar has had worsening delusion and hallucinations over the past few months, seeing his mother who has passed away 8 years ago. He also has been sending money away, saying he is about to receive $23 million from whomever he is sending money to. Neurology was consulted at the request of the accepting facility to 'rule out dementia'. Currently, he is aware that his mother has been dead.   Diagnostic Testing: CT head unremarkable.   Vital Signs:   Vitals:   05/14/17 0553 05/14/17 1549  BP: 109/72 138/68  Pulse: (!) 54 73  Resp: 18 16  Temp: 98.3 F (36.8 C)   SpO2: 96% 98%     Exam:  Mental Status:  Awake, alert, oriented x 3; spelled world WORD and DROW. Recall 0/3 Naming: Intact Repetition: Intact   Speech: fluent  Cranial Nerves:  Pupils: Equal round and reactive to light Extraocular movements: Intact in all cardinal gaze Ptosis: Absent Visual fields: Intact to finger counting Facial sensation: Intact to pin and light touch Facial movements: Intact and symmetric    Motor Exam:  No drift  Tremor/Abnormal Movements:  Resting tremor: Absent Intention tremor: Absent Postural tremor: Absent  Sensory Exam:   Light touch: Intact    Coordination:   Finger to nose: Intact  Medical Decision Making:    - Extensive number of diagnosis or management options are considered above.   - Extensive amount of complex data reviewed.   - High risk of complication and/or morbidity or mortality are associated with differential diagnostic considerations above.  - There may be uncertain outcome and increased probability of prolonged functional impairment or high probability of severe prolonged functional impairment associated with some of these differential diagnosis.   Medical Data Reviewed:  1.Data reviewed include clinical labs, radiology,  Medical Tests;   2.Tests results discussed w/performing or interpreting physician;   3.Obtaining/reviewing old medical records;  4.Obtaining case history from another source;  5.Independent review of image, tracing or specimen.    Patient was informed the Neurology Consult would happen via telehealth (remote video) and consented to receiving care in this manner.

## 2017-05-14 NOTE — ED Provider Notes (Signed)
  Physical Exam  BP 109/72 (BP Location: Left Arm)   Pulse (!) 54   Temp 98.3 F (36.8 C) (Oral)   Resp 18   Ht 5\' 11"  (1.803 m)   Wt 84.8 kg (187 lb)   SpO2 96%   BMI 26.08 kg/m   Physical Exam  ED Course/Procedures     Procedures  MDM   I have been called by [facility] at behavioral health.  States that the patient needed to be IVC and social he could get placed at [facility].  Nursing had stated the patient is no longer is delusional.  On my discussion with him he knew that his mother was not there when she was talking to him 2 days ago.  I do not think she has criteria for IVC right now.  Has had no TTS states that he still has some delusions.  [facility] but [facility].  Another place were taken but they want a neurology consult to rule out dementia.     Davonna Belling, MD 05/14/17 [phone]

## 2017-05-14 NOTE — ED Notes (Signed)
Dr. Thurnell Garbe talked with pt's Neurologist

## 2017-05-14 NOTE — ED Notes (Signed)
Meds administered late as some were not available at time of administration.

## 2017-05-14 NOTE — ED Notes (Addendum)
Pt being re-evaluated by [facility].  Social work reported pt maybe a candidate for [facility] if inpatient is still recommended post re-eval.

## 2017-05-14 NOTE — ED Notes (Signed)
Have given pt a meal tray. Pt's sister in room. Pt denying delusions

## 2017-05-14 NOTE — ED Notes (Signed)
Social Work called and reported that pt was accepted at [facility] if made IVC. Spoke with Education officer, museum regarding this RN and EDP concerns regarding pt is alert and oriented and can make own decisions. Social work also agreed and reported would wait and see if other facilities call in regards to pt placement.  Also discussed possibility for pt to be re-evaluated with TTS due to pt being at baseline mentation and denying hallucinations/si/hi/delusions. Social work also agreed with this recommendation. EDP aware and [place] to re-assess.

## 2017-05-14 NOTE — ED Notes (Signed)
Corey [name]

## 2017-05-15 DIAGNOSIS — N183 Chronic kidney disease, stage 3 (moderate): Secondary | ICD-10-CM | POA: Diagnosis not present

## 2017-05-15 DIAGNOSIS — F312 Bipolar disorder, current episode manic severe with psychotic features: Secondary | ICD-10-CM | POA: Diagnosis not present

## 2017-05-15 DIAGNOSIS — E7800 Pure hypercholesterolemia, unspecified: Secondary | ICD-10-CM | POA: Diagnosis not present

## 2017-05-15 DIAGNOSIS — F319 Bipolar disorder, unspecified: Secondary | ICD-10-CM | POA: Diagnosis not present

## 2017-05-15 DIAGNOSIS — I252 Old myocardial infarction: Secondary | ICD-10-CM | POA: Diagnosis not present

## 2017-05-15 DIAGNOSIS — I2510 Atherosclerotic heart disease of native coronary artery without angina pectoris: Secondary | ICD-10-CM | POA: Diagnosis not present

## 2017-05-15 DIAGNOSIS — Z7982 Long term (current) use of aspirin: Secondary | ICD-10-CM | POA: Diagnosis not present

## 2017-05-15 DIAGNOSIS — F0280 Dementia in other diseases classified elsewhere without behavioral disturbance: Secondary | ICD-10-CM | POA: Diagnosis not present

## 2017-05-15 DIAGNOSIS — D509 Iron deficiency anemia, unspecified: Secondary | ICD-10-CM | POA: Diagnosis not present

## 2017-05-15 DIAGNOSIS — Z7902 Long term (current) use of antithrombotics/antiplatelets: Secondary | ICD-10-CM | POA: Diagnosis not present

## 2017-05-15 DIAGNOSIS — R442 Other hallucinations: Secondary | ICD-10-CM | POA: Diagnosis not present

## 2017-05-15 DIAGNOSIS — F3164 Bipolar disorder, current episode mixed, severe, with psychotic features: Secondary | ICD-10-CM | POA: Diagnosis not present

## 2017-05-15 DIAGNOSIS — K219 Gastro-esophageal reflux disease without esophagitis: Secondary | ICD-10-CM | POA: Diagnosis not present

## 2017-05-15 DIAGNOSIS — G309 Alzheimer's disease, unspecified: Secondary | ICD-10-CM | POA: Diagnosis not present

## 2017-05-15 DIAGNOSIS — N189 Chronic kidney disease, unspecified: Secondary | ICD-10-CM | POA: Diagnosis not present

## 2017-05-15 DIAGNOSIS — Z79899 Other long term (current) drug therapy: Secondary | ICD-10-CM | POA: Diagnosis not present

## 2017-05-15 DIAGNOSIS — I129 Hypertensive chronic kidney disease with stage 1 through stage 4 chronic kidney disease, or unspecified chronic kidney disease: Secondary | ICD-10-CM | POA: Diagnosis not present

## 2017-05-15 DIAGNOSIS — I10 Essential (primary) hypertension: Secondary | ICD-10-CM | POA: Diagnosis not present

## 2017-05-15 DIAGNOSIS — G43909 Migraine, unspecified, not intractable, without status migrainosus: Secondary | ICD-10-CM | POA: Diagnosis not present

## 2017-05-15 NOTE — ED Notes (Signed)
[facility] reported unable to get in touch with [facility]. [facility] has accepted pt but cannot arrive until after [place] aware pt voluntary.  Primary and EDP aware of disposition. This RN attempting to contact [facility] to update.

## 2017-05-15 NOTE — ED Notes (Signed)
Have given pt a meal tray  

## 2017-05-15 NOTE — Progress Notes (Signed)
Contacted [facility] to inquire about pt being IVCed so that he can be transferred to [facility]. Pt's nurse stated pt's EDP did not feel comfortable IVCing pt, as he was beginning to make rational decisions for himself, though they had been told that pt could instead go to [facility] if he were not IVCed. Reviewed pt's notes and stated that, according to the notes, pt had not been referred to [facility]. Agreed to look into this more in an attempt to determine how pt's needs can best be met.

## 2017-05-15 NOTE — Plan of Care (Signed)
Spoke to [place] at [facility] regarding pt's head CT and the results being enough information for pt to be accepted into their program. Nira Conn stated she was unsure, though she could run it by their provider if it was faxed to them. Stated I would fax her the results. [place] Fax: [phone] Attn: Intake

## 2017-05-15 NOTE — ED Provider Notes (Signed)
Pt accepted to [facility], Dr. Michaelyn Barter. Will transfer stable.    Francine Graven, DO 05/15/17 2005

## 2017-05-15 NOTE — ED Notes (Signed)
[facility]. They say they will accept patient after 1930 and please call report to 1 [phone] option 1.

## 2017-05-15 NOTE — BH Assessment (Signed)
Completed a re-assessment on pt to determine whether he continues to meet the criteria for inpatient hospitalization. Pt shares he has been sleeping and eating well. Pt states he got home from work yesterday after working a 12-hour shift at [employer]; he states he ate a bowl of cereal prior to going to work and that he's been working for [employer] for 30-some years. Pt denies any SI but was unable to answer any additional in regards to HI or NSSIB. Pt states he thought he saw his mother at his house last weekend, which was upsetting to him, as she's been dead approximately 8-10 years. Pt has flight of ideas and it is difficult for him to stay on subject.  Pt has been accepted at [facility] and can arrive today after 7pm.

## 2017-05-15 NOTE — ED Provider Notes (Signed)
Neurology consult complete. Hallucinations and confusion thought to be due to underlying psychiatric disease. MRI recommended but not emergently.    Ezequiel Essex, MD 05/15/17 [phone]

## 2017-05-15 NOTE — ED Notes (Signed)
Patient transported to [facility], [place], [place] with [facility], vital signs stable, no distress noted.

## 2017-05-21 ENCOUNTER — Other Ambulatory Visit: Payer: Self-pay

## 2017-05-21 ENCOUNTER — Encounter (HOSPITAL_COMMUNITY): Payer: Self-pay | Admitting: *Deleted

## 2017-05-21 ENCOUNTER — Emergency Department (HOSPITAL_COMMUNITY): Payer: Medicare HMO

## 2017-05-21 ENCOUNTER — Emergency Department (HOSPITAL_COMMUNITY)
Admission: EM | Admit: 2017-05-21 | Discharge: 2017-05-21 | Disposition: A | Discharge status: Home / Self Care | Payer: Medicare HMO | Attending: Emergency Medicine | Admitting: Emergency Medicine

## 2017-05-21 DIAGNOSIS — Z7901 Long term (current) use of anticoagulants: Secondary | ICD-10-CM | POA: Diagnosis not present

## 2017-05-21 DIAGNOSIS — R0789 Other chest pain: Secondary | ICD-10-CM | POA: Diagnosis not present

## 2017-05-21 DIAGNOSIS — N183 Chronic kidney disease, stage 3 (moderate): Secondary | ICD-10-CM | POA: Diagnosis not present

## 2017-05-21 DIAGNOSIS — R410 Disorientation, unspecified: Secondary | ICD-10-CM

## 2017-05-21 DIAGNOSIS — I252 Old myocardial infarction: Secondary | ICD-10-CM | POA: Insufficient documentation

## 2017-05-21 DIAGNOSIS — Z79899 Other long term (current) drug therapy: Secondary | ICD-10-CM | POA: Insufficient documentation

## 2017-05-21 DIAGNOSIS — Z87891 Personal history of nicotine dependence: Secondary | ICD-10-CM | POA: Insufficient documentation

## 2017-05-21 DIAGNOSIS — Z7982 Long term (current) use of aspirin: Secondary | ICD-10-CM | POA: Diagnosis not present

## 2017-05-21 DIAGNOSIS — R079 Chest pain, unspecified: Secondary | ICD-10-CM | POA: Diagnosis not present

## 2017-05-21 DIAGNOSIS — I2510 Atherosclerotic heart disease of native coronary artery without angina pectoris: Secondary | ICD-10-CM | POA: Insufficient documentation

## 2017-05-21 LAB — BASIC METABOLIC PANEL
Anion gap: 12 (ref 5–15)
BUN: 15 mg/dL (ref 6–20)
CHLORIDE: 101 mmol/L (ref 101–111)
CO2: 22 mmol/L (ref 22–32)
Calcium: 9.6 mg/dL (ref 8.9–10.3)
Creatinine, Ser: 1.41 mg/dL — ABNORMAL HIGH (ref 0.61–1.24)
GFR, EST AFRICAN AMERICAN: 59 mL/min — AB (ref >60)
GFR, EST NON AFRICAN AMERICAN: 51 mL/min — AB (ref >60)
Glucose, Bld: 103 mg/dL — ABNORMAL HIGH (ref 65–99)
POTASSIUM: 3.8 mmol/L (ref 3.5–5.1)
SODIUM: 135 mmol/L (ref 135–145)

## 2017-05-21 LAB — URINALYSIS, ROUTINE W REFLEX MICROSCOPIC
Bilirubin Urine: NEGATIVE
Glucose, UA: NEGATIVE mg/dL
Hgb urine dipstick: NEGATIVE
KETONES UR: 5 mg/dL — AB
LEUKOCYTES UA: NEGATIVE
Nitrite: NEGATIVE
PROTEIN: NEGATIVE mg/dL
Specific Gravity, Urine: 1.005 (ref 1.005–1.030)
pH: 7 (ref 5.0–8.0)

## 2017-05-21 LAB — CBC WITH DIFFERENTIAL/PLATELET
BASOS PCT: 0 %
Basophils Absolute: 0 10*3/uL (ref 0.0–0.1)
EOS ABS: 0.1 10*3/uL (ref 0.0–0.7)
Eosinophils Relative: 1 %
HCT: 35.5 % — ABNORMAL LOW (ref 39.0–52.0)
HEMOGLOBIN: 11 g/dL — AB (ref 13.0–17.0)
LYMPHS ABS: 2.2 10*3/uL (ref 0.7–4.0)
Lymphocytes Relative: 25 %
MCH: 22 pg — AB (ref 26.0–34.0)
MCHC: 31 g/dL (ref 30.0–36.0)
MCV: 71 fL — ABNORMAL LOW (ref 78.0–100.0)
Monocytes Absolute: 1.2 10*3/uL — ABNORMAL HIGH (ref 0.1–1.0)
Monocytes Relative: 14 %
NEUTROS PCT: 60 %
Neutro Abs: 5.1 10*3/uL (ref 1.7–7.7)
PLATELETS: 313 10*3/uL (ref 150–400)
RBC: 5 MIL/uL (ref 4.22–5.81)
RDW: 17.5 % — ABNORMAL HIGH (ref 11.5–15.5)
WBC: 8.6 10*3/uL (ref 4.0–10.5)

## 2017-05-21 LAB — TROPONIN I

## 2017-05-21 NOTE — ED Notes (Signed)
Checked on pt for urine sample,can't go right now with try back in 30 minutes

## 2017-05-21 NOTE — Discharge Instructions (Addendum)
The testing today did not show any problems with your heart.  It is important to follow-up with your cardiologist for checkup in the next 1 to 2 weeks.  Also follow-up with your neurologist this week as scheduled, and see your PCP if needed.  Return here if your condition worsens or you have other concerns.  Make sure that you take all of your medications as prescribed.

## 2017-05-21 NOTE — ED Provider Notes (Signed)
[facility] EMERGENCY DEPARTMENT Provider Note   CSN: [id number] Arrival date & time: 05/21/17  1621     History   Chief Complaint Chief Complaint  Patient presents with  . Chest Pain    HPI Corey [name] is a 67 y.o. male.  For evaluation of chest pain which was noticed earlier this afternoon, coming going over 2-hour period of time.  There is been no recurrence of the pain in the last 2 hours.  He presents for evaluation by private vehicle with family members.  He was recently evaluated in the ED, and sent to a psychiatric facility where he stayed a short period of time and was discharged.  He has since been referred to a neurologist to see for ongoing confusion, that appointment is upcoming next week.  With the chest discomfort today he had a period of "fogginess."  There was no clear evidence for diaphoresis, nausea, vomiting, focal weakness or paresthesia.  He is currently staying with family members were watching over him until he sees the neurologist, and then considering taking him to their home in [facility].  Patient states he is taking all of his medications as prescribed.  There are no other known modifying factors.  HPI  Past Medical History:  Diagnosis Date  . Anxiety   . Arthritis   . Bipolar disorder ([place])   . Coronary artery disease   . Depression   . GERD (gastroesophageal reflux disease)   . High cholesterol   . Iron deficiency anemia 12/09/2011  . Migraine   . Myocardial infarct ([place])   . Tinnitus     Patient Active Problem List   Diagnosis Date Noted  . Barrett's esophagus without dysplasia 05/05/2017  . Altered mental status 08/17/2016  . Tobacco abuse 10/30/2015  . Syncope 10/30/2015  . Acute inferolateral myocardial infarction ([place]) 10/29/2015  . Acute MI, inferolateral wall, initial episode of care ([place])   . Bipolar affective disorder, current episode manic with psychotic symptoms ([place]) 04/06/2013  . 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 Barrett's esophagus 02/28/2013  . Sepsis ([place]) 01/25/2013  . CKD (chronic kidney disease), stage III ([place]) 01/25/2013  . CAP (community acquired pneumonia) 01/24/2013  . AKI (acute kidney injury) ([place]) 01/24/2013  . Acute encephalopathy 01/24/2013  . Community acquired bacterial pneumonia 01/24/2013  . SIRS (systemic inflammatory response syndrome) ([place]) 01/24/2013  . Iron deficiency anemia 12/09/2011  . Hypercholesterolemia 12/09/2011    Past Surgical History:  Procedure Laterality Date  . BACK SURGERY    . CARDIAC CATHETERIZATION N/A 10/29/2015   Procedure: Left Heart Cath and Coronary Angiography;  Surgeon: Jettie Booze, MD;  Location: [place] [facility] CV LAB;  Service: Cardiovascular;  Laterality: N/A;  . CARDIAC CATHETERIZATION N/A 10/29/2015   Procedure: Coronary Stent Intervention;  Surgeon: Jettie Booze, MD;  Location: [place] CV LAB;  Service: Cardiovascular;  Laterality: N/A;  . CATARACT EXTRACTION W/PHACO Left 07/01/2015   Procedure: CATARACT EXTRACTION PHACO AND INTRAOCULAR LENS PLACEMENT (IOC);  Surgeon: Rutherford Guys, MD;  Location: [facility];  Service: Ophthalmology;  Laterality: Left;  CDE: 4.16  . ESOPHAGOGASTRODUODENOSCOPY  01/27/2012   Procedure: ESOPHAGOGASTRODUODENOSCOPY (EGD);  Surgeon: Rogene Houston, MD;  Location: [facility];  Service: Endoscopy;  Laterality: N/A;  350-rescheduled to 9:15 Ann notitied pt  . HAND SURGERY Bilateral   . HERNIA REPAIR    . INSERTION OF MESH N/A 12/14/2013   Procedure: INSERTION OF MESH;  Surgeon: Jamesetta So, MD;  Location: [facility] OR[facility];  Service: General;  Laterality: N/A;  . MASS EXCISION Left 07/14/2012   Procedure: EXCISION SOFT TISSUE MASS LEG;  Surgeon: Jamesetta So, MD;  Location: [facility] OR[facility];  Service: General;  Laterality: Left;  . PERIPHERAL VASCULAR CATHETERIZATION  10/29/2015   Procedure: Thrombectomy;  Surgeon: Jettie Booze, MD;  Location: [facility] CV  LAB;  Service: Cardiovascular;;  . ROTATOR CUFF REPAIR Bilateral   . UMBILICAL HERNIA REPAIR N/A 12/14/2013   Procedure: UMBILICAL HERNIORRHAPHY;  Surgeon: Jamesetta So, MD;  Location: [facility];  Service: General;  Laterality: N/A;        Home Medications    Prior to Admission medications   Medication Sig Start Date End Date Taking? Authorizing Provider  ALPRAZolam Duanne Moron) 1 MG tablet Take 1 mg by mouth 3 (three) times daily as needed for anxiety.   Yes [provider]  ARIPiprazole (ABILIFY) 2 MG tablet Take 2 mg by mouth daily.   Yes [provider]  aspirin EC 81 MG tablet Take 1 tablet (81 mg total) by mouth daily. 04/13/13  Yes Niel Hummer, NP  atorvastatin (LIPITOR) 40 MG tablet TAKE 1 TABLET(40 MG) BY MOUTH DAILY AT 6 PM 05/05/17  Yes Branch, Alphonse Guild, MD  buPROPion (WELLBUTRIN XL) 150 MG 24 hr tablet Take 150 mg by mouth daily. 05/20/17  Yes [provider]  clopidogrel (PLAVIX) 75 MG tablet On the first day, take 4 pills (300 mg ), then 75 mg daily thereafter Patient taking differently: Take 75 mg by mouth daily.  01/11/17  Yes Lendon Colonel, NP  lisinopril (PRINIVIL,ZESTRIL) 2.5 MG tablet Take 1 tablet (2.5 mg total) by mouth daily. 04/06/17  Yes BranchAlphonse Guild, MD  pantoprazole (PROTONIX) 40 MG tablet Take 40 mg by mouth daily. 04/25/17  Yes [provider]  traZODone (DESYREL) 50 MG tablet Take 50 mg by mouth at bedtime.   Yes [provider]  nitroGLYCERIN (NITROSTAT) 0.4 MG SL tablet Place 1 tablet (0.4 mg total) under the tongue every 5 (five) minutes as needed for chest pain. 10/31/15   Cheryln Manly, NP    Family History Family History  Problem Relation Age of Onset  . Heart attack Father   . CVA Father   . Cancer Brother     Social History Social History   Tobacco Use  . Smoking status: Former Smoker    Types: Cigarettes    Start date: 02/04/1969    Last attempt to quit: 10/27/2015    Years since  quitting: 1.5  . Smokeless tobacco: Never Used  . Tobacco comment: quit over 3 yrs ago  Substance Use Topics  . Alcohol use: No    Alcohol/week: 0.0 oz  . Drug use: No     Allergies   Patient has no known allergies.   Review of Systems Review of Systems  All other systems reviewed and are negative.    Physical Exam Updated Vital Signs BP 132/71   Pulse 68   Temp 98.8 F (37.1 C) (Oral)   Resp 13   Ht 5\' 11"  (1.803 m)   Wt 81.6 kg (180 lb)   SpO2 97%   BMI 25.10 kg/m   Physical Exam  Constitutional: He is oriented to person, place, and time. He appears well-developed and well-nourished.  HENT:  Head: Normocephalic and atraumatic.  Right Ear: External ear normal.  Left Ear: External ear normal.  Eyes: Pupils are equal, round, and reactive to light. Conjunctivae  and EOM are normal.  Neck: Normal range of motion and phonation normal. Neck supple.  Cardiovascular: Normal rate, regular rhythm and normal heart sounds.  Pulmonary/Chest: Effort normal and breath sounds normal. He exhibits no bony tenderness.  Abdominal: Soft. There is no tenderness.  Musculoskeletal: Normal range of motion.  Neurological: He is alert and oriented to person, place, and time. No cranial nerve deficit or sensory deficit. He exhibits normal muscle tone. Coordination normal.  Somewhat confused about, both recent and remote events.  No dysarthria or aphasia.  Skin: Skin is warm, dry and intact.  Psychiatric: He has a normal mood and affect. His behavior is normal. Judgment and thought content normal.  Nursing note and vitals reviewed.    ED Treatments / Results  Labs (all labs ordered are listed, but only abnormal results are displayed) Labs Reviewed  CBC WITH DIFFERENTIAL/PLATELET - Abnormal; Notable for the following components:      Result Value   Hemoglobin 11.0 (*)    HCT 35.5 (*)    MCV 71.0 (*)    MCH 22.0 (*)    RDW 17.5 (*)    Monocytes Absolute 1.2 (*)    All other  components within normal limits  BASIC METABOLIC PANEL - Abnormal; Notable for the following components:   Glucose, Bld 103 (*)    Creatinine, Ser 1.41 (*)    GFR calc non Af Amer 51 (*)    GFR calc Af Amer 59 (*)    All other components within normal limits  URINALYSIS, ROUTINE W REFLEX MICROSCOPIC - Abnormal; Notable for the following components:   Color, Urine STRAW (*)    Ketones, ur 5 (*)    All other components within normal limits  TROPONIN I    EKG EKG Interpretation  Date/Time:  Saturday May 21 2017 16:41:27 EDT Ventricular Rate:  71 PR Interval:    QRS Duration: 99 QT Interval:  412 QTC Calculation: 448 R Axis:   -6 Text Interpretation:  Sinus rhythm Low voltage, precordial leads Abnormal R-wave progression, early transition since last tracing no significant change Confirmed by Daleen Bo [phone]) on 05/21/2017 5:16:56 PM   Radiology Dg Chest 2 View  Result Date: 05/21/2017 CLINICAL DATA:  Intermittent chest pain. EXAM: CHEST - 2 VIEW COMPARISON:  Chest x-ray dated May 13, 2017. FINDINGS: The heart size and mediastinal contours are within normal limits. Both lungs are clear. The visualized skeletal structures are unremarkable. IMPRESSION: No active cardiopulmonary disease. Electronically Signed   By: Titus Dubin M.D.   On: 05/21/2017 18:54    Procedures Procedures (including critical care time)  Medications Ordered in ED Medications - No data to display   Initial Impression / Assessment and Plan / ED Course  I have reviewed the triage vital signs and the nursing notes.  Pertinent labs & imaging results that were available during my care of the patient were reviewed by me and considered in my medical decision making (see chart for details).  Clinical Course as of May 22 2011  Sat May 21, 2017  1950 Normal  Urinalysis, Routine w reflex microscopic(!) [EW]  1951 Normal except hemoglobin low 11.0  CBC with Differential(!) [EW]  1951 Normal  Troponin  I [EW]  1951 Normal except creatinine high 1.4  Basic metabolic panel(!) [EW]    Clinical Course User Index [EW] Daleen Bo, MD     Patient Vitals for the past 24 hrs:  BP Temp Temp src Pulse Resp SpO2 Height Weight  05/21/17 1800  132/71 - - 68 13 97 % - -  05/21/17 1730 132/74 - - 69 14 95 % - -  05/21/17 1700 131/79 - - 70 10 97 % - -  05/21/17 1642 139/80 98.8 F (37.1 C) Oral 71 18 97 % 5\' 11"  (1.803 m) 81.6 kg (180 lb)    8:09 PM  Reevaluation with update and discussion. After initial assessment and treatment, an updated evaluation reveals patient is comfortable has had no recurrence of chest pain.  Findings discussed with patient, and son, all questions answered. [place] decision making-nonspecific chest discomfort, spontaneously resolved.  Pain atypical for cardiac disease.  Screening evaluation is reassuring.  Mild renal insufficiently at baseline.  Minimal low hemoglobin at 11.0.  Doubt ACS, PE or pneumonia.  Patient with nonspecified confusion, currently being managed, with neurology follow-up, and recently evaluated by psychiatry as an inpatient.   Nursing Notes Reviewed/ Care Coordinated Applicable Imaging Reviewed Interpretation of Laboratory Data incorporated into ED treatment  The patient appears reasonably screened and/or stabilized for discharge and I doubt any other medical condition or other [facility] requiring further screening, evaluation, or treatment in the ED at this time prior to discharge.  Plan: Home Medications-continue usual; Home Treatments-regular diet and push fluids; return here if the recommended treatment, does not improve the symptoms; Recommended follow up-neurology follow-up this week as scheduled, PCP follow-up as soon as possible.   Final Clinical Impressions(s) / ED Diagnoses   Final diagnoses:  Nonspecific chest pain  Confusion    ED Discharge Orders    None       Daleen Bo, MD 05/21/17 2013

## 2017-05-21 NOTE — ED Triage Notes (Addendum)
Pt was standing outside and suddenly started having sharp chest pains to the left side of the chest with diaphoresis. Denies SOB, nausea, dizziness. No radiation of pain. Pt denies any chest pain at this current time. Pt had MI in the last 2 years with 2 stents placed.  Pt also c/o intermittent left ear pain x 1 week. Denies drainage.

## 2017-05-24 ENCOUNTER — Encounter: Payer: Self-pay | Admitting: Neurology

## 2017-05-24 ENCOUNTER — Other Ambulatory Visit: Payer: Self-pay

## 2017-05-24 ENCOUNTER — Ambulatory Visit: Payer: Medicare HMO | Admitting: Neurology

## 2017-05-24 VITALS — BP 96/62 | HR 101 | Ht 71.0 in | Wt 174.5 lb

## 2017-05-24 DIAGNOSIS — E538 Deficiency of other specified B group vitamins: Secondary | ICD-10-CM | POA: Diagnosis not present

## 2017-05-24 DIAGNOSIS — R413 Other amnesia: Secondary | ICD-10-CM

## 2017-05-24 MED ORDER — DIVALPROEX SODIUM 250 MG PO DR TAB: 250.0000 mg | DELAYED_RELEASE_TABLET | Freq: Two times a day (BID) | ORAL | 3 refills | Status: AC
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
## 2017-05-25 LAB — VITAMIN B12: VITAMIN B 12: 643 pg/mL (ref 232–1245)

## 2017-05-25 LAB — RPR: RPR: NONREACTIVE

## 2017-05-25 LAB — SEDIMENTATION RATE: Sed Rate: 8 mm/hr (ref 0–30)

## 2017-05-26 DIAGNOSIS — I10 Essential (primary) hypertension: Secondary | ICD-10-CM | POA: Diagnosis not present

## 2017-05-26 DIAGNOSIS — F319 Bipolar disorder, unspecified: Secondary | ICD-10-CM | POA: Diagnosis not present

## 2017-05-26 DIAGNOSIS — F0390 Unspecified dementia without behavioral disturbance: Secondary | ICD-10-CM | POA: Diagnosis not present

## 2017-05-26 DIAGNOSIS — I2510 Atherosclerotic heart disease of native coronary artery without angina pectoris: Secondary | ICD-10-CM | POA: Diagnosis not present

## 2017-05-26 DIAGNOSIS — E782 Mixed hyperlipidemia: Secondary | ICD-10-CM | POA: Diagnosis not present

## 2017-05-26 DIAGNOSIS — Z6824 Body mass index (BMI) 24.0-24.9, adult: Secondary | ICD-10-CM | POA: Diagnosis not present

## 2017-05-26 DIAGNOSIS — F5101 Primary insomnia: Secondary | ICD-10-CM | POA: Diagnosis not present

## 2017-05-26 DIAGNOSIS — R413 Other amnesia: Secondary | ICD-10-CM | POA: Diagnosis not present

## 2017-05-26 DIAGNOSIS — H60322 Hemorrhagic otitis externa, left ear: Secondary | ICD-10-CM | POA: Diagnosis not present

## 2017-05-27 DIAGNOSIS — Z6823 Body mass index (BMI) 23.0-23.9, adult: Secondary | ICD-10-CM | POA: Diagnosis not present

## 2017-05-27 DIAGNOSIS — H9190 Unspecified hearing loss, unspecified ear: Secondary | ICD-10-CM | POA: Diagnosis not present

## 2017-05-27 DIAGNOSIS — H9202 Otalgia, left ear: Secondary | ICD-10-CM | POA: Diagnosis not present

## 2017-05-31 DIAGNOSIS — F319 Bipolar disorder, unspecified: Secondary | ICD-10-CM | POA: Diagnosis not present

## 2017-06-01 DIAGNOSIS — R413 Other amnesia: Secondary | ICD-10-CM | POA: Diagnosis not present

## 2017-06-01 DIAGNOSIS — F319 Bipolar disorder, unspecified: Secondary | ICD-10-CM | POA: Diagnosis not present

## 2017-06-02 DIAGNOSIS — F319 Bipolar disorder, unspecified: Secondary | ICD-10-CM | POA: Diagnosis not present

## 2017-06-02 DIAGNOSIS — R413 Other amnesia: Secondary | ICD-10-CM | POA: Diagnosis not present

## 2017-06-05 DIAGNOSIS — R413 Other amnesia: Secondary | ICD-10-CM | POA: Diagnosis not present

## 2017-06-05 DIAGNOSIS — F319 Bipolar disorder, unspecified: Secondary | ICD-10-CM | POA: Diagnosis not present

## 2017-06-16 DIAGNOSIS — F319 Bipolar disorder, unspecified: Secondary | ICD-10-CM | POA: Diagnosis not present

## 2017-06-16 DIAGNOSIS — R413 Other amnesia: Secondary | ICD-10-CM | POA: Diagnosis not present

## 2017-06-22 ENCOUNTER — Ambulatory Visit: Payer: Medicare HMO | Admitting: Neurology

## 2017-06-23 DIAGNOSIS — H903 Sensorineural hearing loss, bilateral: Secondary | ICD-10-CM | POA: Diagnosis not present

## 2017-06-23 DIAGNOSIS — H9313 Tinnitus, bilateral: Secondary | ICD-10-CM | POA: Diagnosis not present

## 2017-06-29 ENCOUNTER — Ambulatory Visit (HOSPITAL_COMMUNITY): Admission: RE | Admit: 2017-06-29 | Payer: Medicare HMO | Source: Ambulatory Visit | Admitting: Internal Medicine

## 2017-06-29 ENCOUNTER — Telehealth (INDEPENDENT_AMBULATORY_CARE_PROVIDER_SITE_OTHER): Payer: Self-pay | Admitting: *Deleted

## 2017-06-29 ENCOUNTER — Encounter (HOSPITAL_COMMUNITY): Admission: RE | Payer: Self-pay | Source: Ambulatory Visit

## 2017-06-29 SURGERY — EGD (ESOPHAGOGASTRODUODENOSCOPY)
Anesthesia: Moderate Sedation

## 2017-06-29 NOTE — Telephone Encounter (Signed)
Patient didn't show for EGD today (06/29/17)

## 2017-06-29 NOTE — Telephone Encounter (Signed)
noted 

## 2017-07-04 ENCOUNTER — Ambulatory Visit: Payer: Medicare HMO | Admitting: Neurology

## 2017-07-12 DIAGNOSIS — Z6823 Body mass index (BMI) 23.0-23.9, adult: Secondary | ICD-10-CM | POA: Diagnosis not present

## 2017-07-12 DIAGNOSIS — R413 Other amnesia: Secondary | ICD-10-CM | POA: Diagnosis not present

## 2017-07-14 DIAGNOSIS — F0390 Unspecified dementia without behavioral disturbance: Secondary | ICD-10-CM | POA: Diagnosis not present

## 2017-08-26 DIAGNOSIS — E782 Mixed hyperlipidemia: Secondary | ICD-10-CM | POA: Diagnosis not present

## 2017-08-26 DIAGNOSIS — Z125 Encounter for screening for malignant neoplasm of prostate: Secondary | ICD-10-CM | POA: Diagnosis not present

## 2017-08-26 DIAGNOSIS — Z87891 Personal history of nicotine dependence: Secondary | ICD-10-CM | POA: Diagnosis not present

## 2017-08-26 DIAGNOSIS — N183 Chronic kidney disease, stage 3 (moderate): Secondary | ICD-10-CM | POA: Diagnosis not present

## 2017-08-26 DIAGNOSIS — F17211 Nicotine dependence, cigarettes, in remission: Secondary | ICD-10-CM | POA: Diagnosis not present

## 2017-08-26 DIAGNOSIS — Z0000 Encounter for general adult medical examination without abnormal findings: Secondary | ICD-10-CM | POA: Diagnosis not present

## 2017-08-26 DIAGNOSIS — Z6822 Body mass index (BMI) 22.0-22.9, adult: Secondary | ICD-10-CM | POA: Diagnosis not present

## 2017-08-26 DIAGNOSIS — R413 Other amnesia: Secondary | ICD-10-CM | POA: Diagnosis not present

## 2017-08-26 DIAGNOSIS — I10 Essential (primary) hypertension: Secondary | ICD-10-CM | POA: Diagnosis not present

## 2017-08-26 DIAGNOSIS — F319 Bipolar disorder, unspecified: Secondary | ICD-10-CM | POA: Diagnosis not present

## 2017-11-07 DIAGNOSIS — R69 Illness, unspecified: Secondary | ICD-10-CM | POA: Diagnosis not present

## 2017-11-22 DIAGNOSIS — H43811 Vitreous degeneration, right eye: Secondary | ICD-10-CM | POA: Diagnosis not present

## 2017-11-28 ENCOUNTER — Encounter: Payer: Self-pay | Admitting: Neurology

## 2017-11-28 ENCOUNTER — Telehealth: Payer: Self-pay | Admitting: Neurology

## 2017-11-28 ENCOUNTER — Ambulatory Visit: Payer: Medicare HMO | Admitting: Neurology

## 2017-11-28 NOTE — Telephone Encounter (Signed)
This patient did not show for a revisit appointment today. 

## 2019-11-30 IMAGING — DX DG CHEST 2V
3 series · 3 of 3 positions shown · non-contrast
Comparison: 08/17/2016

CLINICAL DATA: Weakness, memory loss

EXAM:
CHEST - 2 VIEW

[chest pa (1 of 2)]
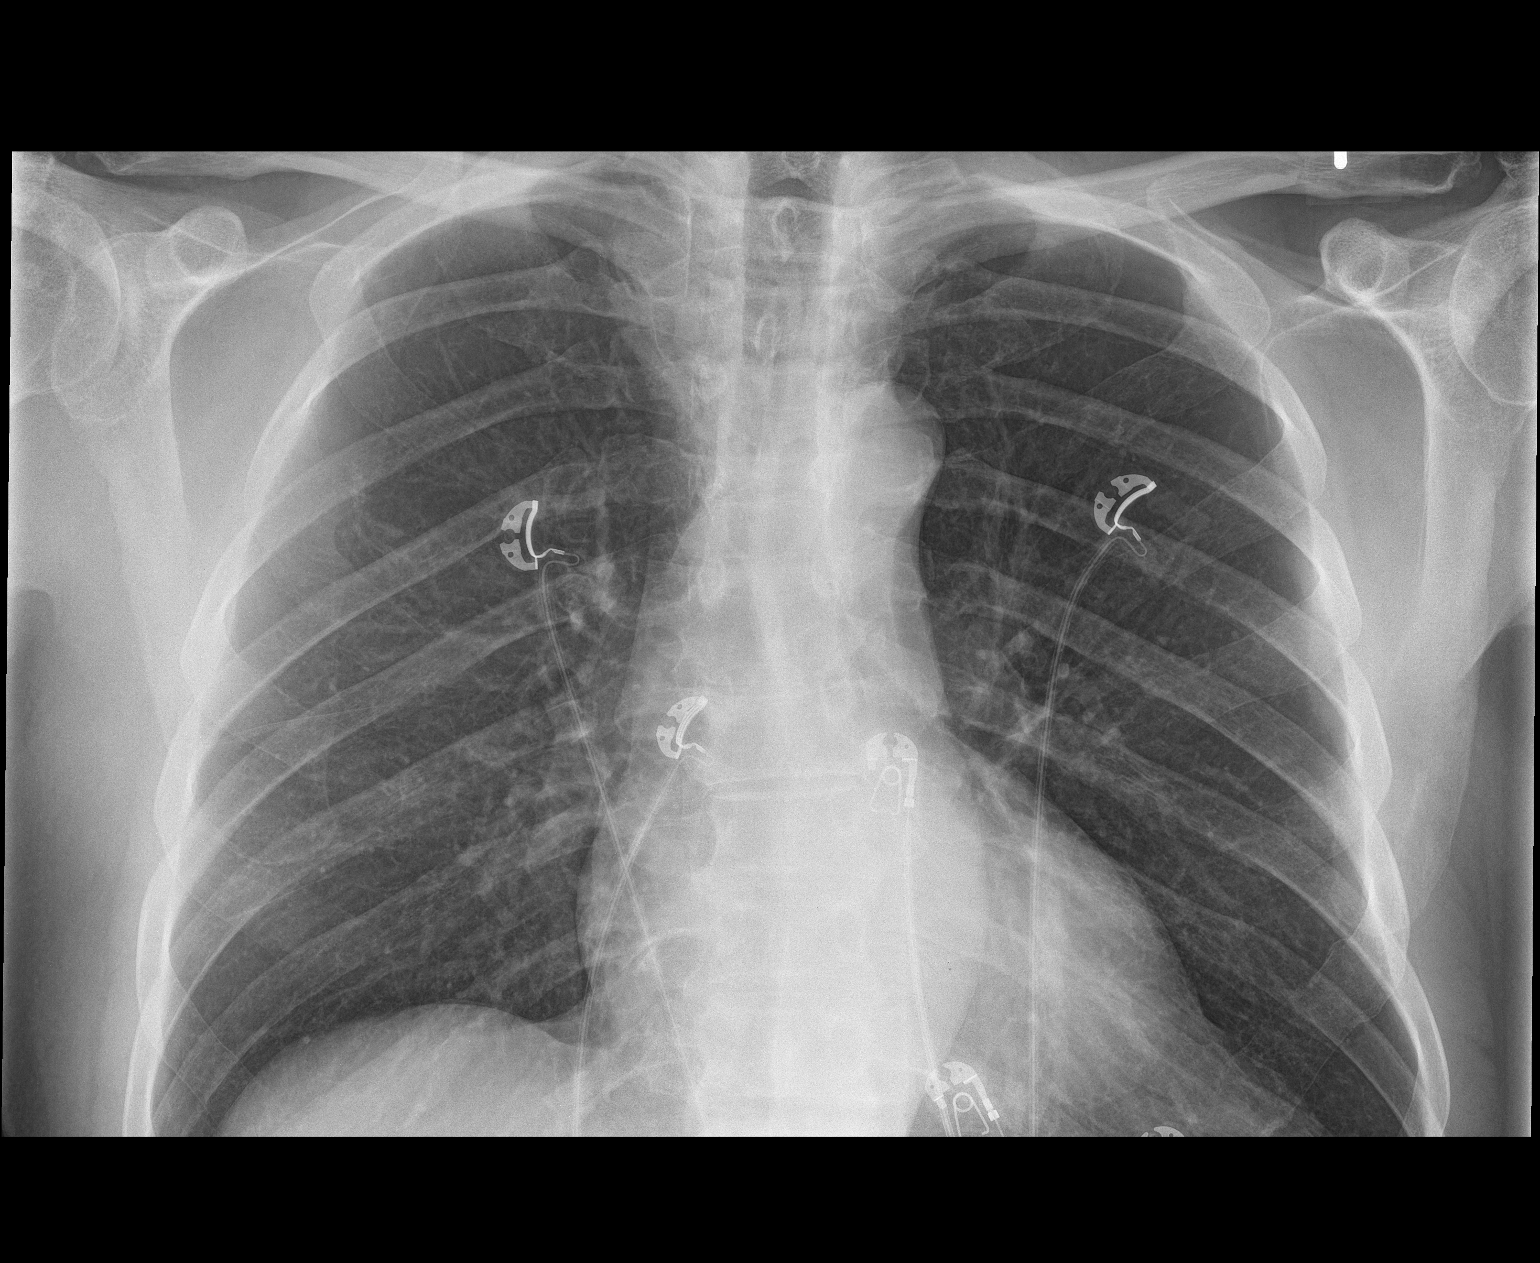

[chest lat]
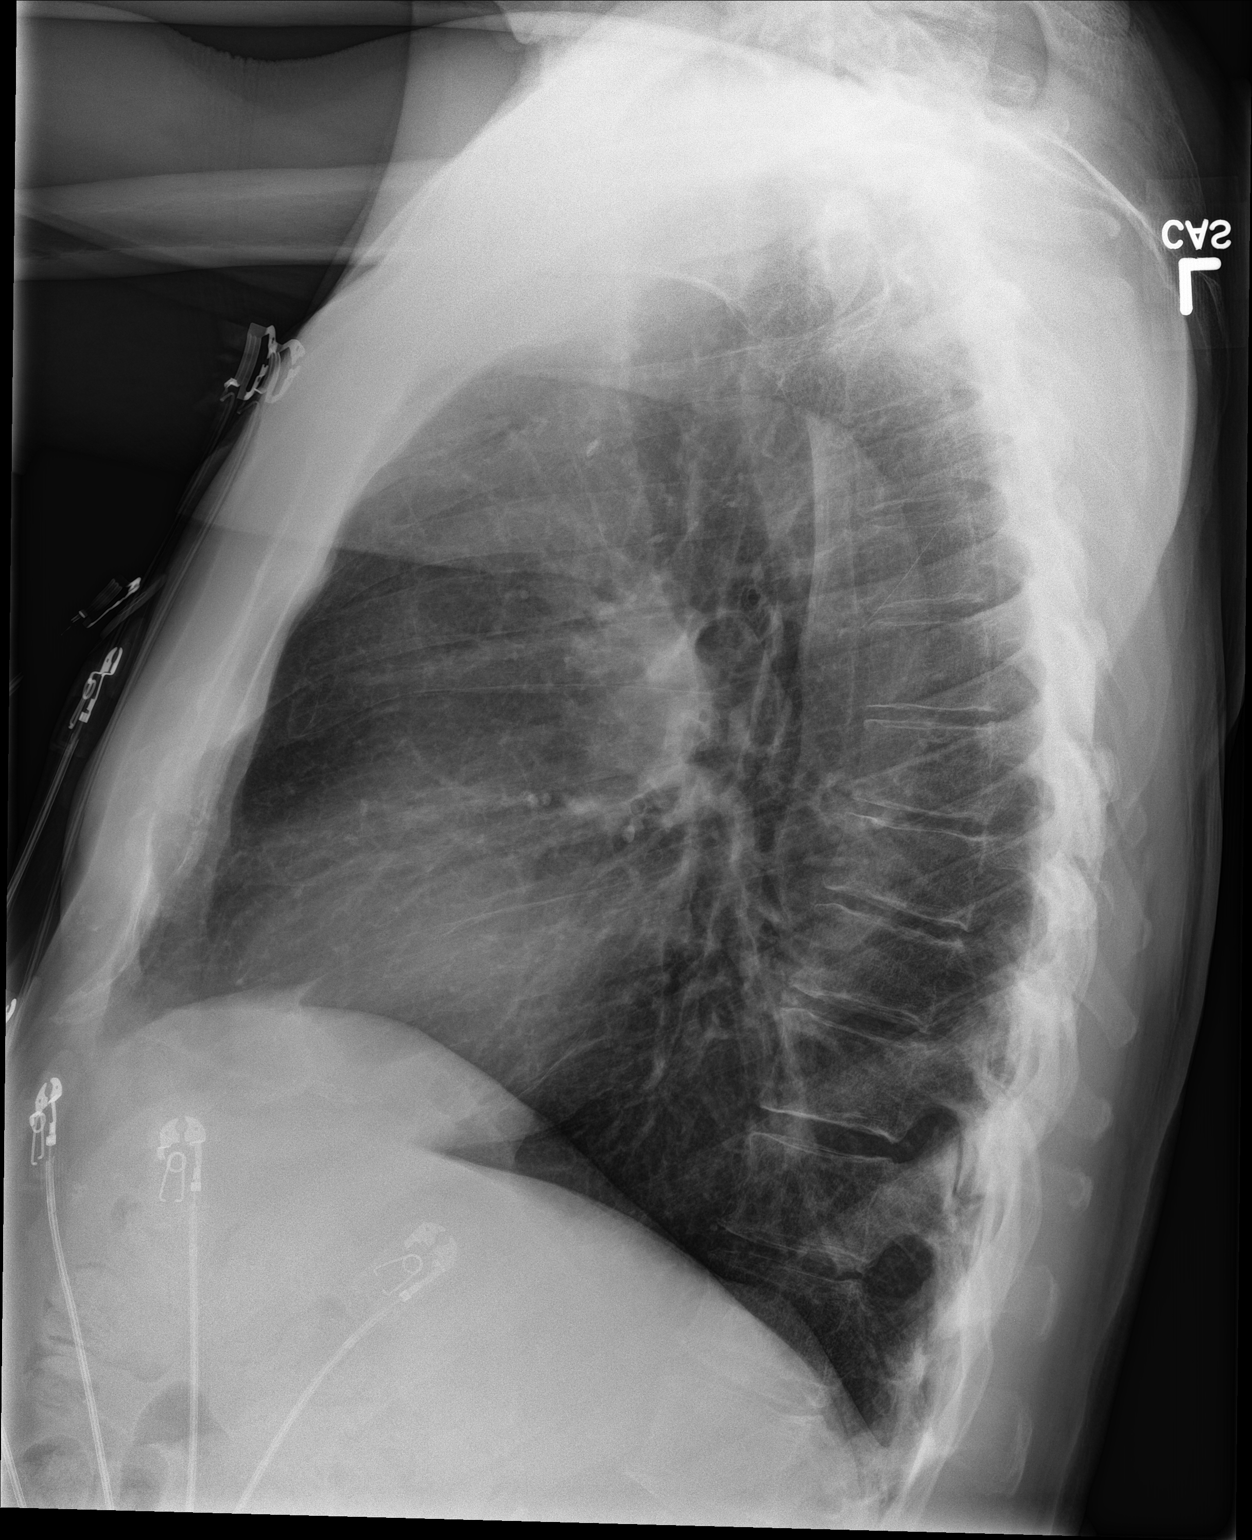

[chest pa (2 of 2)]
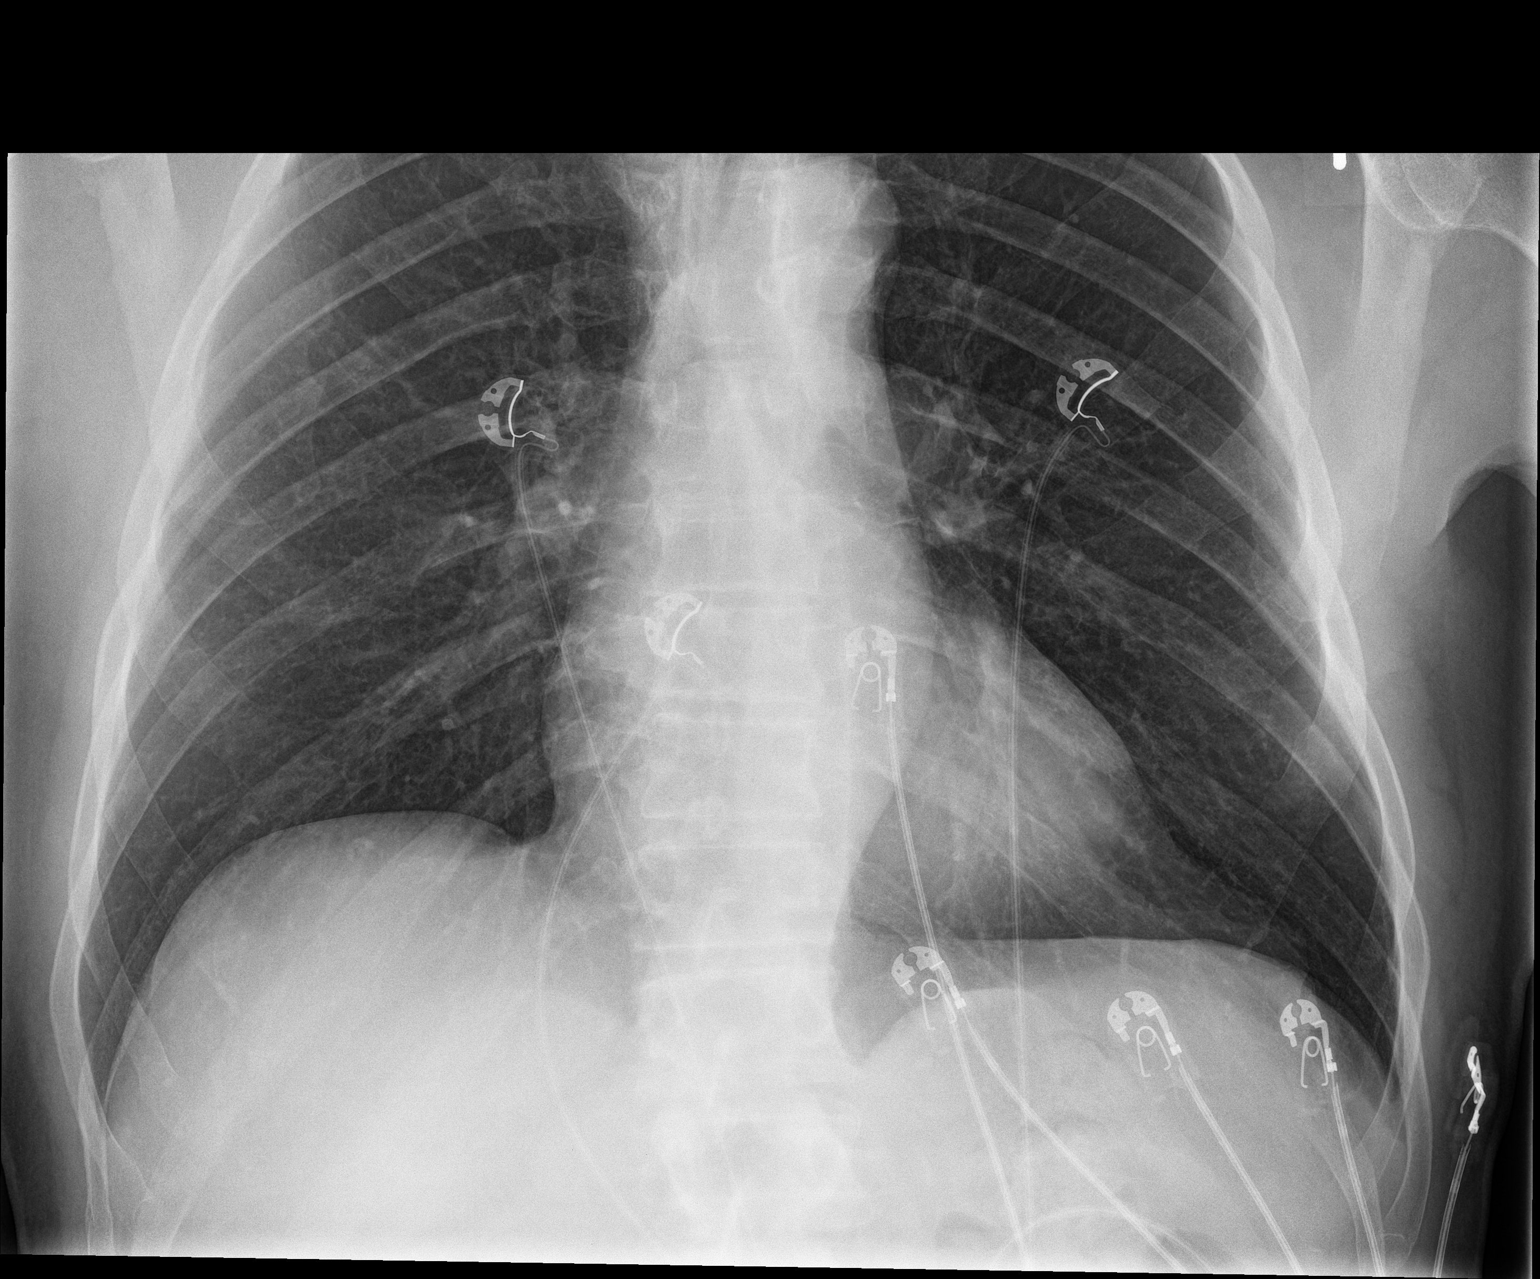

[3 of 3 positions shown; findings below may reference images not displayed]

FINDINGS: Heart and mediastinal contours are within normal limits. No focal
opacities or effusions. No acute bony abnormality.
IMPRESSION: No active cardiopulmonary disease.

## 2019-12-08 IMAGING — DX DG CHEST 2V
3 series · 3 of 3 positions shown · non-contrast
Comparison: Chest x-ray dated May 13, 2017.

CLINICAL DATA: Intermittent chest pain.

EXAM:
CHEST - 2 VIEW

[chest pa (1 of 2)]
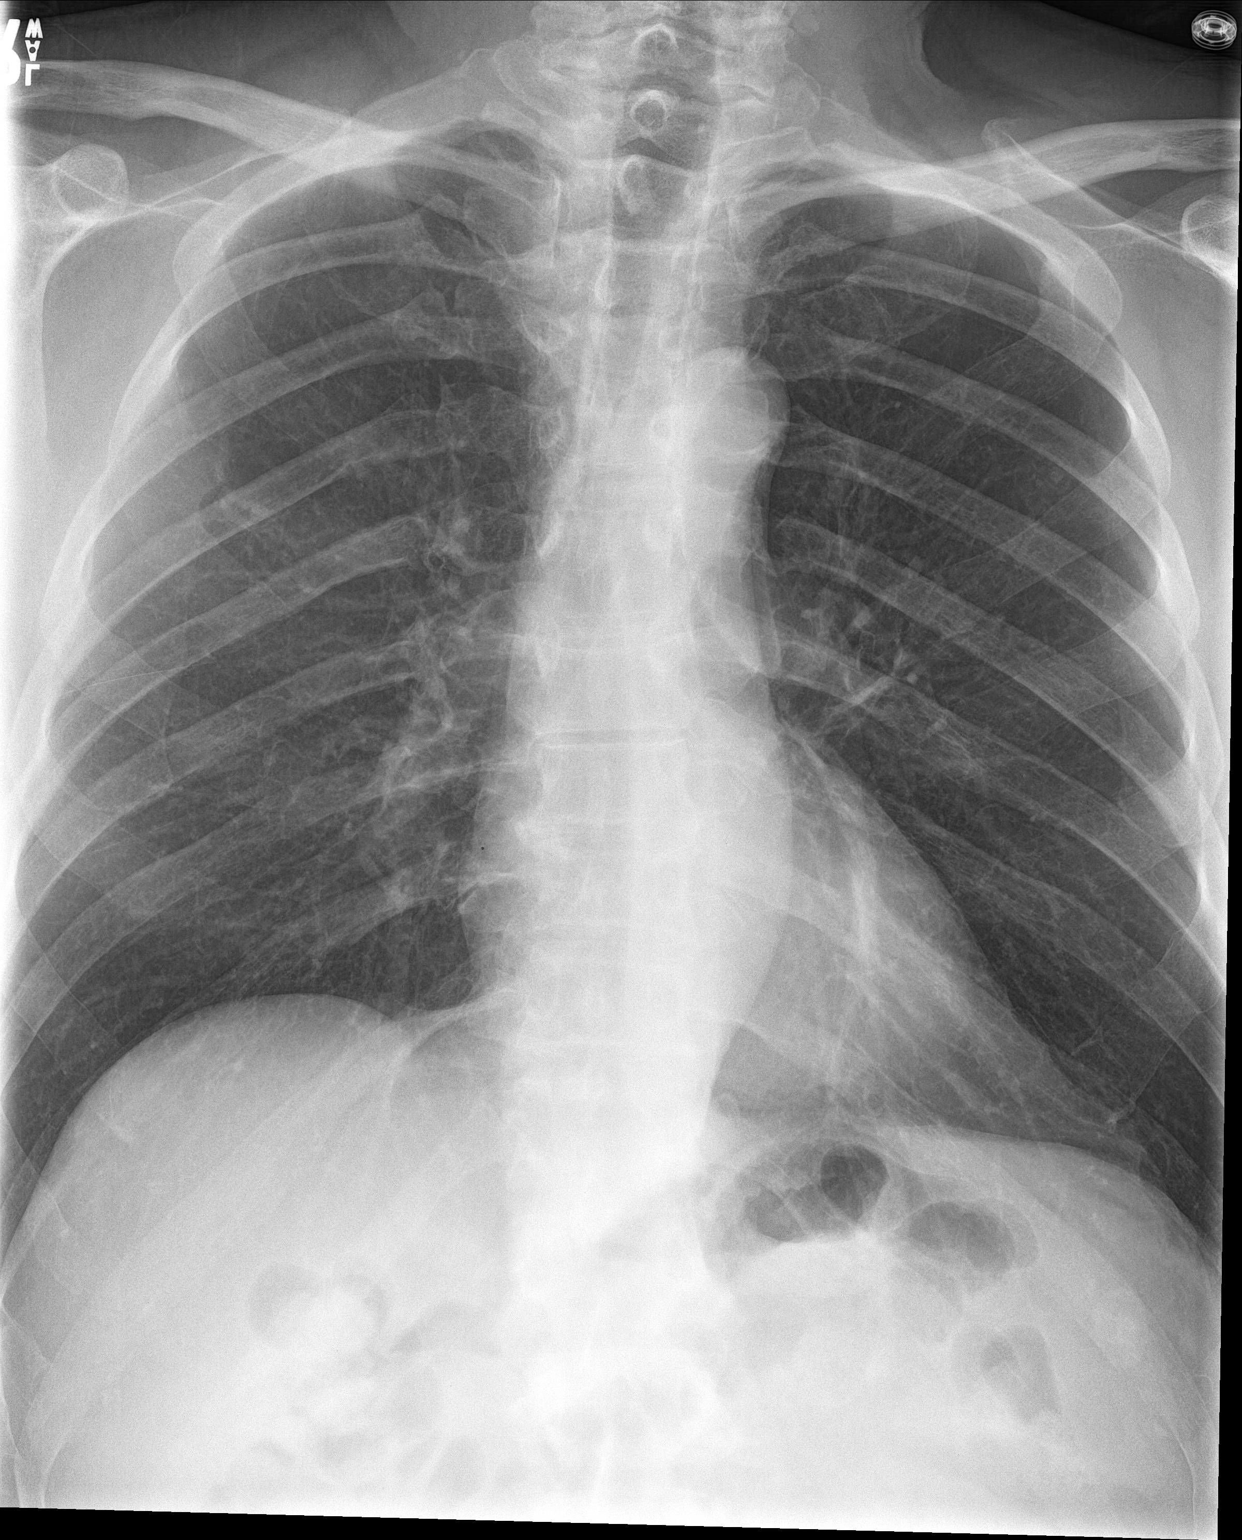

[chest lat]
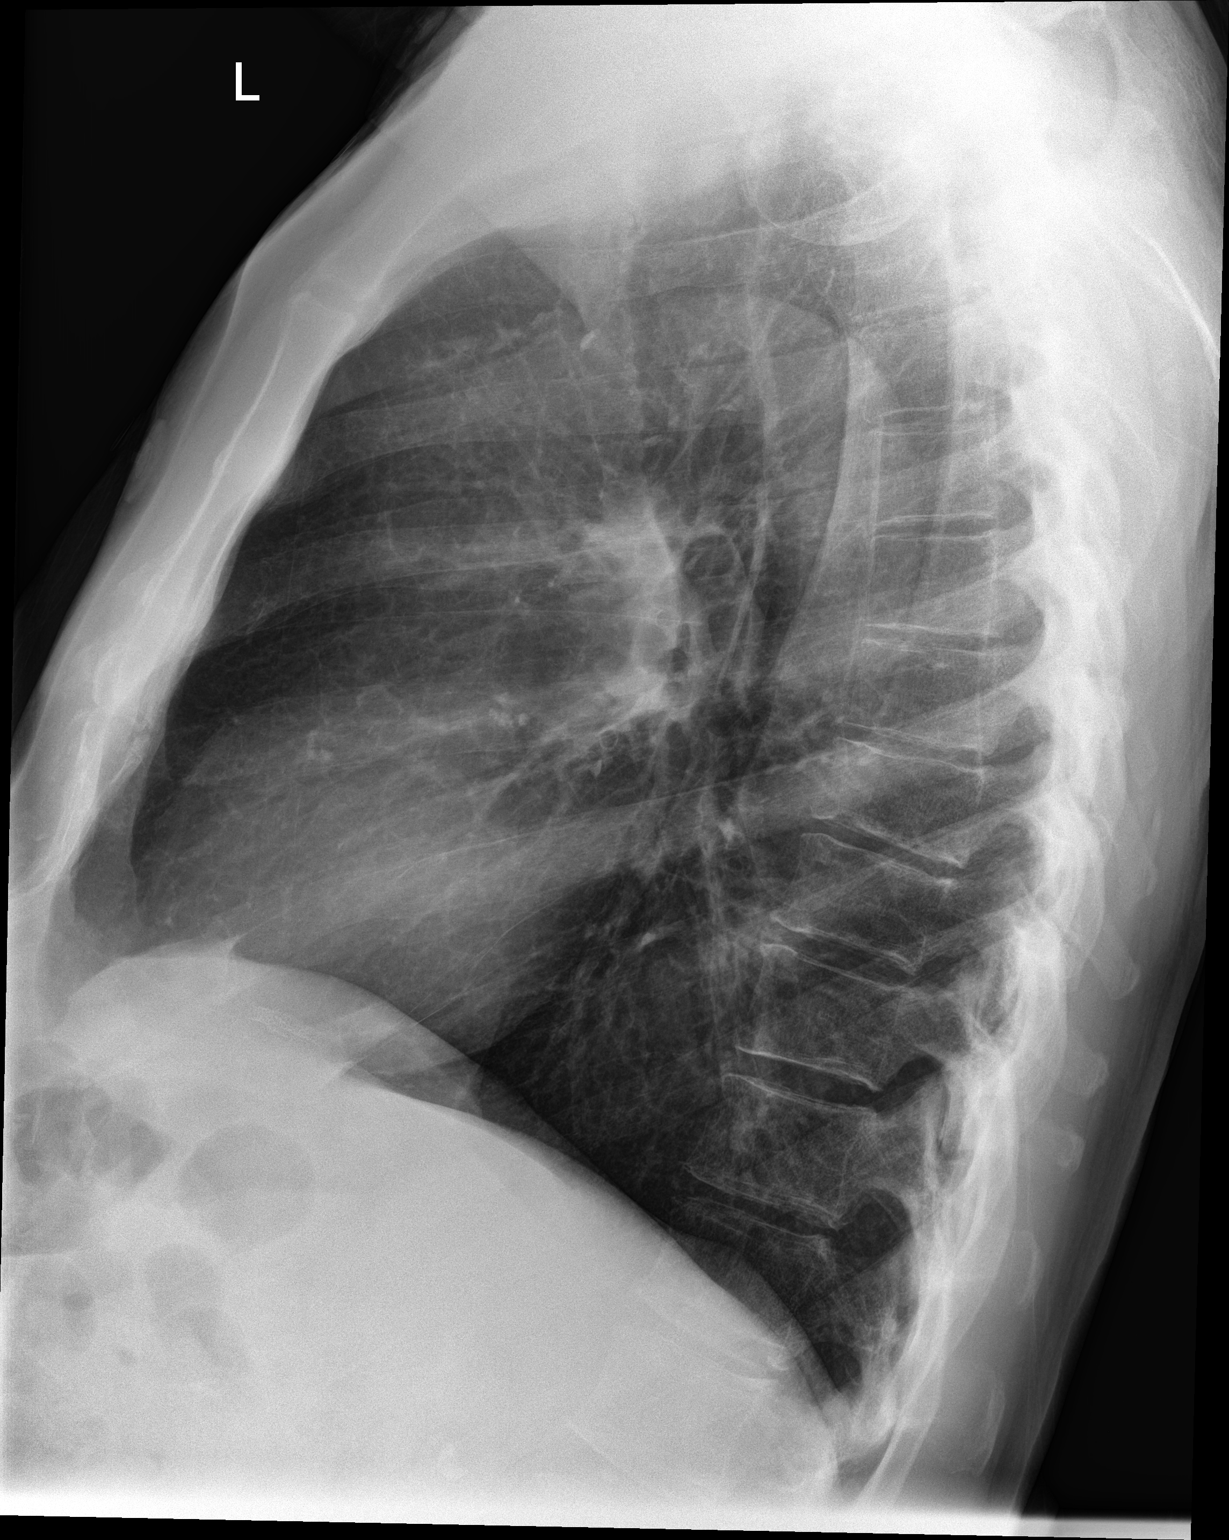

[chest pa (2 of 2)]
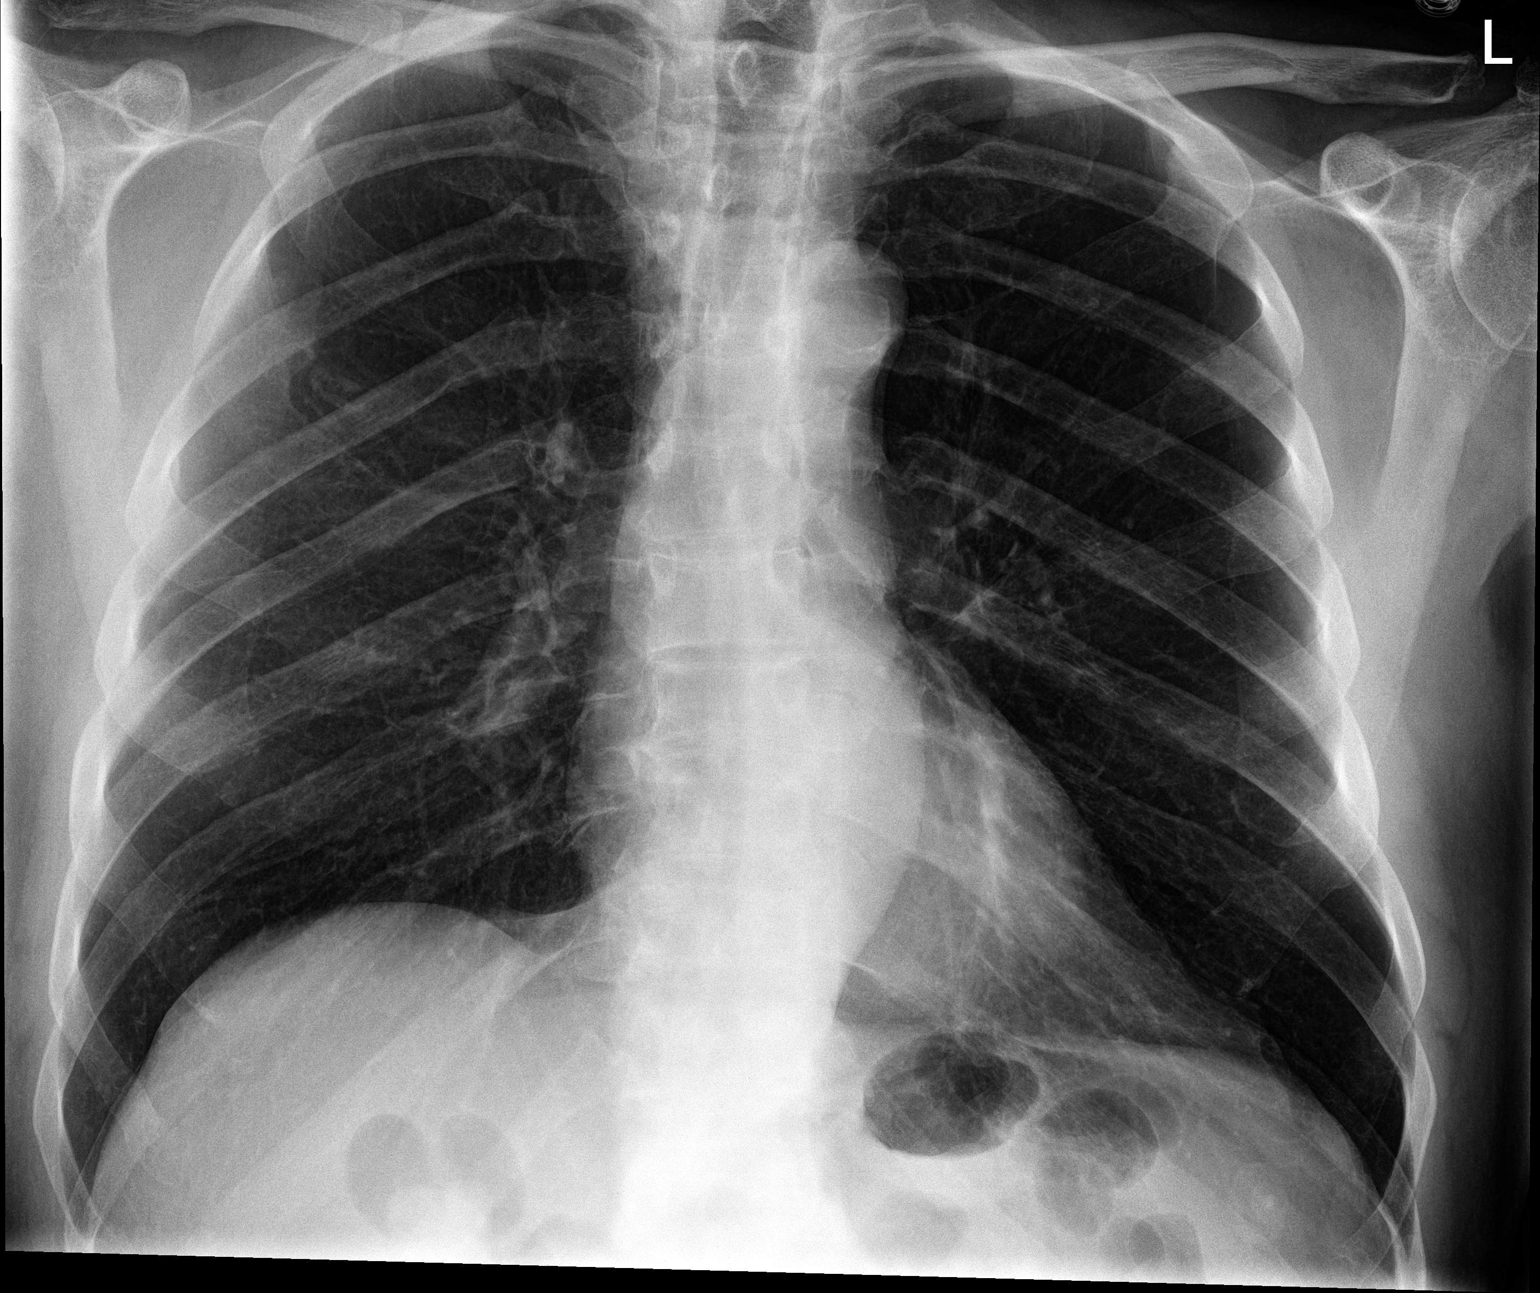

[3 of 3 positions shown; findings below may reference images not displayed]

FINDINGS: The heart size and mediastinal contours are within normal limits.
Both lungs are clear. The visualized skeletal structures are
unremarkable.
IMPRESSION: No active cardiopulmonary disease.

## 2021-12-14 ENCOUNTER — Encounter (INDEPENDENT_AMBULATORY_CARE_PROVIDER_SITE_OTHER): Payer: Self-pay | Admitting: Gastroenterology

## 2023-09-26 DEATH — deceased
